# Patient Record
Sex: Female | Born: 1935 | Race: Black or African American | Hispanic: No | Smoking: Never smoker
Health system: Southern US, Community
[De-identification: ages and names within clinical notes are randomized; demographics above are authoritative.]

## PROBLEM LIST (undated history)

## (undated) DIAGNOSIS — N189 Chronic kidney disease, unspecified: Secondary | ICD-10-CM

## (undated) DIAGNOSIS — F0280 Dementia in other diseases classified elsewhere without behavioral disturbance: Secondary | ICD-10-CM

## (undated) DIAGNOSIS — E785 Hyperlipidemia, unspecified: Secondary | ICD-10-CM

## (undated) DIAGNOSIS — I10 Essential (primary) hypertension: Secondary | ICD-10-CM

## (undated) DIAGNOSIS — F0390 Unspecified dementia without behavioral disturbance: Secondary | ICD-10-CM

## (undated) HISTORY — DX: Dementia in other diseases classified elsewhere, unspecified severity, without behavioral disturbance, psychotic disturbance, mood disturbance, and anxiety: F02.80

## (undated) HISTORY — DX: Chronic kidney disease, unspecified: N18.9

## (undated) HISTORY — DX: Unspecified dementia, unspecified severity, without behavioral disturbance, psychotic disturbance, mood disturbance, and anxiety: F03.90

## (undated) HISTORY — PX: ABDOMINAL HYSTERECTOMY: SHX81

## (undated) HISTORY — DX: Hyperlipidemia, unspecified: E78.5

## (undated) HISTORY — DX: Essential (primary) hypertension: I10

## (undated) HISTORY — PX: BACK SURGERY: SHX140

---

## 2002-06-11 ENCOUNTER — Emergency Department (HOSPITAL_COMMUNITY): Admission: EM | Admit: 2002-06-11 | Discharge: 2002-06-11 | Payer: Self-pay | Admitting: Emergency Medicine

## 2002-06-11 ENCOUNTER — Encounter: Payer: Self-pay | Admitting: Emergency Medicine

## 2004-09-15 ENCOUNTER — Ambulatory Visit (HOSPITAL_COMMUNITY): Admission: RE | Admit: 2004-09-15 | Discharge: 2004-09-15 | Payer: Self-pay | Admitting: Pulmonary Disease

## 2004-09-21 ENCOUNTER — Ambulatory Visit (HOSPITAL_COMMUNITY): Admission: RE | Admit: 2004-09-21 | Discharge: 2004-09-21 | Payer: Self-pay | Admitting: Pulmonary Disease

## 2004-10-01 ENCOUNTER — Ambulatory Visit (HOSPITAL_COMMUNITY): Admission: RE | Admit: 2004-10-01 | Discharge: 2004-10-01 | Payer: Self-pay | Admitting: Pulmonary Disease

## 2004-10-25 ENCOUNTER — Inpatient Hospital Stay (HOSPITAL_COMMUNITY): Admission: RE | Admit: 2004-10-25 | Discharge: 2004-10-26 | Payer: Self-pay | Admitting: Neurosurgery

## 2004-10-25 ENCOUNTER — Encounter (INDEPENDENT_AMBULATORY_CARE_PROVIDER_SITE_OTHER): Payer: Self-pay | Admitting: *Deleted

## 2007-08-13 ENCOUNTER — Ambulatory Visit (HOSPITAL_COMMUNITY): Admission: RE | Admit: 2007-08-13 | Discharge: 2007-08-13 | Payer: Self-pay | Admitting: Neurosurgery

## 2007-09-05 ENCOUNTER — Encounter (INDEPENDENT_AMBULATORY_CARE_PROVIDER_SITE_OTHER): Payer: Self-pay | Admitting: Neurosurgery

## 2007-09-05 ENCOUNTER — Inpatient Hospital Stay (HOSPITAL_COMMUNITY): Admission: RE | Admit: 2007-09-05 | Discharge: 2007-09-09 | Payer: Self-pay | Admitting: Neurosurgery

## 2009-05-11 ENCOUNTER — Emergency Department (HOSPITAL_COMMUNITY): Admission: EM | Admit: 2009-05-11 | Discharge: 2009-05-11 | Payer: Self-pay | Admitting: Emergency Medicine

## 2010-08-06 ENCOUNTER — Emergency Department (HOSPITAL_COMMUNITY)
Admission: EM | Admit: 2010-08-06 | Discharge: 2010-08-06 | Payer: Self-pay | Source: Home / Self Care | Admitting: Emergency Medicine

## 2010-09-21 ENCOUNTER — Ambulatory Visit (HOSPITAL_COMMUNITY)
Admission: RE | Admit: 2010-09-21 | Discharge: 2010-09-21 | Payer: Self-pay | Source: Home / Self Care | Attending: Pulmonary Disease | Admitting: Pulmonary Disease

## 2010-10-22 ENCOUNTER — Ambulatory Visit (HOSPITAL_COMMUNITY)
Admission: RE | Admit: 2010-10-22 | Discharge: 2010-10-22 | Payer: Self-pay | Source: Home / Self Care | Admitting: "Endocrinology

## 2010-12-01 ENCOUNTER — Emergency Department (HOSPITAL_COMMUNITY): Payer: Medicare Other

## 2010-12-01 ENCOUNTER — Inpatient Hospital Stay (HOSPITAL_COMMUNITY)
Admission: EM | Admit: 2010-12-01 | Discharge: 2010-12-02 | DRG: 312 | Disposition: A | Discharge status: Home / Self Care | Payer: Medicare Other | Attending: Pulmonary Disease | Admitting: Pulmonary Disease

## 2010-12-01 DIAGNOSIS — D649 Anemia, unspecified: Secondary | ICD-10-CM | POA: Diagnosis present

## 2010-12-01 DIAGNOSIS — E119 Type 2 diabetes mellitus without complications: Secondary | ICD-10-CM | POA: Diagnosis present

## 2010-12-01 DIAGNOSIS — R42 Dizziness and giddiness: Secondary | ICD-10-CM

## 2010-12-01 DIAGNOSIS — R000 Tachycardia, unspecified: Secondary | ICD-10-CM | POA: Diagnosis present

## 2010-12-01 DIAGNOSIS — E0590 Thyrotoxicosis, unspecified without thyrotoxic crisis or storm: Secondary | ICD-10-CM | POA: Diagnosis present

## 2010-12-01 DIAGNOSIS — E785 Hyperlipidemia, unspecified: Secondary | ICD-10-CM | POA: Diagnosis present

## 2010-12-01 DIAGNOSIS — R55 Syncope and collapse: Principal | ICD-10-CM | POA: Diagnosis present

## 2010-12-01 DIAGNOSIS — I517 Cardiomegaly: Secondary | ICD-10-CM

## 2010-12-01 DIAGNOSIS — I10 Essential (primary) hypertension: Secondary | ICD-10-CM | POA: Diagnosis present

## 2010-12-01 LAB — GLUCOSE, CAPILLARY
Glucose-Capillary: 127 mg/dL — ABNORMAL HIGH (ref 70–99)
Glucose-Capillary: 189 mg/dL — ABNORMAL HIGH (ref 70–99)
Glucose-Capillary: 134 mg/dL — ABNORMAL HIGH (ref 70–99)

## 2010-12-01 LAB — CBC
HCT: 31.1 % — ABNORMAL LOW (ref 36.0–46.0)
Hemoglobin: 10.6 g/dL — ABNORMAL LOW (ref 12.0–15.0)
MCH: 27.4 pg (ref 26.0–34.0)
MCHC: 34.1 g/dL (ref 30.0–36.0)
MCV: 80.4 fL (ref 78.0–100.0)
RBC: 3.87 MIL/uL (ref 3.87–5.11)
RDW: 13.6 % (ref 11.5–15.5)

## 2010-12-01 LAB — POCT CARDIAC MARKERS
CKMB, poc: 1.2 ng/mL (ref 1.0–8.0)
Myoglobin, poc: 102 ng/mL (ref 12–200)
Troponin i, poc: <0.05 ng/mL (ref 0.00–0.09)

## 2010-12-01 LAB — BASIC METABOLIC PANEL
BUN: 22 mg/dL (ref 6–23)
CO2: 27 mEq/L (ref 19–32)
Calcium: 9.3 mg/dL (ref 8.4–10.5)
Chloride: 99 mEq/L (ref 96–112)
Creatinine, Ser: 1.21 mg/dL — ABNORMAL HIGH (ref 0.4–1.2)
GFR calc Af Amer: 52 mL/min — ABNORMAL LOW (ref >60)
GFR calc non Af Amer: 43 mL/min — ABNORMAL LOW (ref >60)
Glucose, Bld: 163 mg/dL — ABNORMAL HIGH (ref 70–99)
Potassium: 3.5 mEq/L (ref 3.5–5.1)

## 2010-12-01 LAB — DIFFERENTIAL
Basophils Absolute: 0 10*3/uL (ref 0.0–0.1)
Basophils Relative: 0 % (ref 0–1)
Eosinophils Absolute: 0 10*3/uL (ref 0.0–0.7)
Lymphocytes Relative: 26 % (ref 12–46)
Neutro Abs: 4.2 10*3/uL (ref 1.7–7.7)
Neutrophils Relative %: 65 % (ref 43–77)

## 2010-12-01 LAB — URINALYSIS, ROUTINE W REFLEX MICROSCOPIC
Bilirubin Urine: NEGATIVE
Ketones, ur: 15 mg/dL — AB
Nitrite: NEGATIVE
Protein, ur: NEGATIVE mg/dL
Urine Glucose, Fasting: NEGATIVE mg/dL
pH: 6 (ref 5.0–8.0)

## 2010-12-01 LAB — CARDIAC PANEL(CRET KIN+CKTOT+MB+TROPI)
CK, MB: 1.9 ng/mL (ref 0.3–4.0)
CK, MB: 1.8 ng/mL (ref 0.3–4.0)
Relative Index: INVALID (ref 0.0–2.5)
Relative Index: INVALID (ref 0.0–2.5)
Total CK: 86 U/L (ref 7–177)
Total CK: 97 U/L (ref 7–177)
Troponin I: 0.02 ng/mL (ref 0.00–0.06)

## 2010-12-02 LAB — DIFFERENTIAL
Basophils Absolute: 0 10*3/uL (ref 0.0–0.1)
Basophils Relative: 0 % (ref 0–1)
Eosinophils Absolute: 0 10*3/uL (ref 0.0–0.7)
Eosinophils Relative: 0 % (ref 0–5)
Monocytes Absolute: 0.8 10*3/uL (ref 0.1–1.0)

## 2010-12-02 LAB — BASIC METABOLIC PANEL
Calcium: 9.1 mg/dL (ref 8.4–10.5)
GFR calc Af Amer: 56 mL/min — ABNORMAL LOW (ref >60)
GFR calc non Af Amer: 46 mL/min — ABNORMAL LOW (ref >60)
Glucose, Bld: 122 mg/dL — ABNORMAL HIGH (ref 70–99)
Sodium: 141 mEq/L (ref 135–145)

## 2010-12-02 LAB — CBC
HCT: 30.9 % — ABNORMAL LOW (ref 36.0–46.0)
Hemoglobin: 10.2 g/dL — ABNORMAL LOW (ref 12.0–15.0)
MCH: 26.8 pg (ref 26.0–34.0)
MCHC: 33 g/dL (ref 30.0–36.0)
MCV: 81.3 fL (ref 78.0–100.0)
Platelets: 222 10*3/uL (ref 150–400)
RBC: 3.8 MIL/uL — ABNORMAL LOW (ref 3.87–5.11)
RDW: 13.8 % (ref 11.5–15.5)
WBC: 6.2 10*3/uL (ref 4.0–10.5)

## 2010-12-02 LAB — GLUCOSE, CAPILLARY

## 2010-12-02 LAB — IRON AND TIBC: TIBC: 265 ug/dL (ref 250–470)

## 2010-12-14 LAB — POCT CARDIAC MARKERS
CKMB, poc: <1 ng/mL — ABNORMAL LOW (ref 1.0–8.0)
Myoglobin, poc: 58.2 ng/mL (ref 12–200)

## 2010-12-14 LAB — DIFFERENTIAL
Lymphocytes Relative: 21 % (ref 12–46)
Lymphs Abs: 1.4 10*3/uL (ref 0.7–4.0)
Monocytes Relative: 8 % (ref 3–12)
Neutro Abs: 4.6 10*3/uL (ref 1.7–7.7)
Neutrophils Relative %: 71 % (ref 43–77)

## 2010-12-14 LAB — CBC
Hemoglobin: 11.3 g/dL — ABNORMAL LOW (ref 12.0–15.0)
MCH: 27 pg (ref 26.0–34.0)
RBC: 4.2 MIL/uL (ref 3.87–5.11)

## 2010-12-14 LAB — BASIC METABOLIC PANEL
CO2: 27 mEq/L (ref 19–32)
Calcium: 9.8 mg/dL (ref 8.4–10.5)
GFR calc Af Amer: >60 mL/min (ref >60)
GFR calc non Af Amer: 52 mL/min — ABNORMAL LOW (ref >60)
Sodium: 140 mEq/L (ref 135–145)

## 2010-12-18 NOTE — Progress Notes (Signed)
  NAMEGIOVANNINA, SPADEA          ACCOUNT NO.:  [id number]  MEDICAL RECORD NO.:  [id number]           PATIENT TYPE:  I  LOCATION:  IC02                          FACILITY:  [facility]  PHYSICIAN:  Psalm Arman L. Luan Pulling, M.D.DATE OF BIRTH:  October 15, 1930  DATE OF PROCEDURE: DATE OF DISCHARGE:                                PROGRESS NOTE   Ms. Sumption was admitted yesterday with 2 episodes of syncope and tachycardia that was non documented.  Dr. Lattie Haw has seen her and suggested we do an outpatient workup once we have ruled out myocardial infarction which has been done.  Her physical exam this morning shows that she is awake and alert.  She looks very comfortable.  She has not had any more cardiac arrhythmias.  Her lab work shows that her electrolytes were normal.  Glucose is 122. CBC shows white count 6200, hemoglobin is 10.2, platelets 222 and her TSH was normal.  Cardiac panels have been normal.  Echocardiogram is essentially normal.  ASSESSMENT:  I think she is ready for discharge.  PLAN:  To discharge her home.  Please see discharge summary for details.     Lenford Beddow L. Luan Pulling, M.D.     ELH/MEDQ  D:  12/02/2010  T:  12/02/2010  Job:  [id number]  Electronically Signed by Sinda Du M.D. on 12/18/2010 10:37:53 AM

## 2010-12-18 NOTE — Discharge Summary (Signed)
  Betty [name], Betty [name]          ACCOUNT NO.:  [id number]  MEDICAL RECORD NO.:  [id number]           PATIENT TYPE:  I  LOCATION:  IC02                          FACILITY:  [facility]  PHYSICIAN:  Neriyah Cercone L. Luan Pulling, M.D.DATE OF BIRTH:  [birth year]  DATE OF ADMISSION:  12/01/2010 DATE OF DISCHARGE:  LH                              DISCHARGE SUMMARY   FINAL DISCHARGE DIAGNOSES: 1. Syncopal episodes x2. 2. Tachycardia not documented. 3. Hypertension. 4. Diabetes. 5. Anemia. 6. Hyperlipidemia. 7. Hyperthyroidism. 8. Status post lumbar laminectomy. 9. Status post hysterectomy.  [name] is a 77 year old, who came to the emergency room with syncope x2.  These actually occurred while she was in bed.  She told me that she totally lost consciousness, but she told the cardiologist that she did not.  Her exam, she eventually came to the emergency room because of this. She has had a recent thyroid biopsy for hyperthyroidism.  Exam showed that she was awake and alert.  Pupils are reactive.  Nose and throat were clear.  Mucous membranes are moist.  Chest was clear.  Heart was regular without tachycardia.  Her abdomen was soft.  Her blood pressure initially was elevated at about 200/70, later about 160/70.  EKG showed sinus rhythm rate in the 80s, nonspecific ST-T wave changes.  BUN was 22, creatinine 1.21.  HOSPITAL COURSE:  She was brought in for rule out myocardial infarction. She had consultation with [facility], who felt that since she was back to baseline that she should have an event monitor.  She had echocardiogram that was essentially normal.  She ruled out for myocardial infarction.  TSH was back to normal in the hospital, so she is going to be discharged home in improved condition on.  She will be discharged home to continue her Hyzaar 100/12.5.  She will be on amlodipine 5 mg daily.  She actually is supposed to be on that at home, but did not bring, it is part of her  medications, metoprolol 25 mg b.i.d.  She will continue with her Fosamax 70 mg weekly and continue with her metformin 1000 mg b.i.d.  She will have an event monitor.  She will be discharged and as mentioned in much improved condition.  She will continue with diabetic diet.     Olyvia Gopal L. Luan Pulling, M.D.     ELH/MEDQ  D:  12/02/2010  T:  12/02/2010  Job:  [id number]  Electronically Signed by Sinda Du M.D. on 12/18/2010 10:37:35 AM

## 2010-12-18 NOTE — H&P (Signed)
Betty [name], [name]          ACCOUNT NO.:  [id number]  MEDICAL RECORD NO.:  [id number]           PATIENT TYPE:  I  LOCATION:  IC02                          FACILITY:  [facility]  PHYSICIAN:  Ansel Ferrall L. Luan Pulling, M.D.DATE OF BIRTH:  1930-12-19  DATE OF ADMISSION:  12/01/2010 DATE OF DISCHARGE:  LH                             HISTORY & PHYSICAL   Betty [name] was admitted from the emergency room with syncope.  This is an unusual history.  She said that she had two episodes of syncope this morning, actually while she was in bed.  She totally lost consciousness. She woke up, did not have any evidence of a postictal state or anything else.  She woke up and felt like she was normal, but she felt like her heart was going too fast.  She tried she had been fainted again.  The second time she woke up, her heart was no longer racing and she was able to get up.  She was brought to the emergency room.  She has a history of problems with her thyroid, has recently been evaluated by [name], the endocrinologist, and had a thyroid biopsy.  Her past medical history is positive for hypertension, diabetes, anemia, hyperlipidemia, hyperthyroidism.  Surgically, she has had back surgery and hysterectomy.  SOCIAL HISTORY:  She does not have any history of smoking.  She does not use any alcohol.  Does not use any illicit drugs.  She has no known drug allergies.  At home, she has been taking: 1. Metformin 500 mg b.i.d. 2. Cozaar 100/25 daily. 3. She is also supposed to be on amlodipine, but apparently is not. 4. Crestor 20 mg daily.  REVIEW OF SYSTEMS:  Except as mentioned is negative.  Her family history is not known to be positive for hyperthyroidism.  PHYSICAL EXAMINATION:  GENERAL:  Her exam shows that she is awake and alert. HEENT:  Her pupils are reactive.  Nose and throat are clear.  Mucous membranes are moist. NECK:  Supple.  She does not have any bruits or JVD. CHEST:  Clear without  wheezes, rales, or rhonchi. HEART:  Regular now with no tachycardia with normal sinus rhythm.  She does not have a murmur, gallop, or rub. ABDOMEN:  Soft without masses. EXTREMITIES:  Showed no edema. CENTRAL NERVOUS SYSTEM EXAMINATION:  Grossly intact. VITAL SIGNS:  Her blood pressure initially was about 200/70, later went down to about 160/70, pulse in the 80s and regular.  She was afebrile.  Her EKG showed a sinus rhythm with a rate in the 80s and nonspecific ST- T wave changes.  Her white blood count was 6500, hemoglobin 10.6, platelets 228.  Her cardiac panel in the emergency room was normal, potassium 3.5, BUN of 22, creatinine 1.21.  Chest x-ray, no acute cardiopulmonary process.  Her urine was negative.  Assessment then she has had syncope.  It is not clear what happened. This may have been a tachyarrhythmia related to her hyperthyroidism. She is going to be admitted, ruled out for myocardial infarction, I am going to ask for Cardiology consultation.  She will have a monitor because of concerns that this might  be related to cardiac arrhythmias.     Sagan Wurzel L. Luan Pulling, M.D.     ELH/MEDQ  D:  12/01/2010  T:  12/02/2010  Job:  [id number]  Electronically Signed by Sinda Du M.D. on 12/18/2010 10:37:39 AM

## 2010-12-29 DIAGNOSIS — R55 Syncope and collapse: Secondary | ICD-10-CM

## 2011-01-08 LAB — URINALYSIS, ROUTINE W REFLEX MICROSCOPIC
Protein, ur: NEGATIVE mg/dL
Urobilinogen, UA: 0.2 mg/dL (ref 0.0–1.0)

## 2011-01-08 LAB — CBC
MCHC: 34 g/dL (ref 30.0–36.0)
Platelets: 175 10*3/uL (ref 150–400)
RBC: 4.1 MIL/uL (ref 3.87–5.11)
WBC: 5.4 10*3/uL (ref 4.0–10.5)

## 2011-01-08 LAB — DIFFERENTIAL
Basophils Relative: 0 % (ref 0–1)
Monocytes Relative: 10 % (ref 3–12)
Neutro Abs: 3.4 10*3/uL (ref 1.7–7.7)
Neutrophils Relative %: 62 % (ref 43–77)

## 2011-01-08 LAB — BASIC METABOLIC PANEL
BUN: 20 mg/dL (ref 6–23)
CO2: 30 mEq/L (ref 19–32)
Calcium: 8.9 mg/dL (ref 8.4–10.5)
Creatinine, Ser: 1.15 mg/dL (ref 0.4–1.2)
GFR calc Af Amer: 56 mL/min — ABNORMAL LOW (ref >60)

## 2011-01-08 LAB — GLUCOSE, CAPILLARY: Glucose-Capillary: 187 mg/dL — ABNORMAL HIGH (ref 70–99)

## 2011-02-05 NOTE — Consult Note (Signed)
NAMEKRYSTEL, Betty [name]          ACCOUNT NO.:  [id number]  MEDICAL RECORD NO.:  [id number]           PATIENT TYPE:  I  LOCATION:  IC02                          FACILITY:  [facility]  PHYSICIAN:  Cristopher Estimable. Lattie Haw, MD, FACCDATE OF BIRTH:  09-04-1931  DATE OF CONSULTATION:  12/01/2010 DATE OF DISCHARGE:                                CONSULTATION   CHIEF COMPLAINT:  Dizziness.  HISTORY OF PRESENT ILLNESS:  This is a 77 year old African American female patient who awakened with dizziness and rapid heart beat as well as skipping at 4 a.m.  She became presyncopal but did not fully lose consciousness.  It occurred again and lasted approximately 15 minutes. She got up and had her son take her blood pressure which was 200/66, and she came to the emergency room.  She recently was diagnosed with hyperthyroidism and had a biopsy earlier this month.  She is not treated for this yet.  She has occasional palpitations with fast skipping heartbeats that gets better when she walks around.  This occurs approximately once a week.  She has never had it as severe as this morning that awakened her from sleep.  She denies frank syncope, chest pain, dyspnea, dyspnea on exertion.  She walks approximately 1-1/2 miles a day without problems.  CURRENT MEDICATIONS: 1. Amlodipine 5 mg daily. 2. Hyzaar 100 mg/25 mg daily. 3. Lovenox. 4. Crestor 20 mg daily. 5. She was taking metformin 500 mg b.i.d. prior to admission.  PAST MEDICAL HISTORY:  Significant for hypertension, recently diagnosed hyperthyroidism, biopsy earlier this month, diabetes mellitus, history of DJD.  She has had lumbar L4 and L5 surgery in December 2008.  She has also had partial hysterectomy.  SOCIAL HISTORY:  She is widowed, lives with her son.  She has 4 children.  She walks 1-1/2 miles per day.  She has never smoked tobacco.  FAMILY HISTORY:  Her mother died of an MI at age 51.  Father died of prostate cancer.  She has 1 sister that  with breast cancer.  One sister died of an MI in her 40s.  Two brothers are alive and well.  One sister has thyroid problems.  REVIEW OF SYSTEMS:  Significant for those stated in HPI.  She also has gastroesophageal reflux symptoms and belching as well as sour taste in her mouth.  All other systems reviewed and negative.  PHYSICAL EXAMINATION:  GENERAL:  This is a very pleasant, young-looking 77 year old female, in no acute distress. VITAL SIGNS:  Orthostatic blood pressures were taken.  Blood pressure 200/68 lying, 210/73 sitting, 205/77 standing, pulse is 80, afebrile, respirations 16. HEENT:  Head is normocephalic without signs of trauma.  Extraocular movements are intact.  Pupils equal and reactive to light and accommodation.  Nasal mucosa is moist.  Throat is without erythema or exudate. NECK:  No thyroid enlargement.  She did have a left carotid bruits and hard palpable nodule on the right.  No JVD or HJR. LUNGS:  Clear anterior, posterior, and lateral. HEART:  Regular rate and rhythm at 80 beats per minute.  Normal S1 and S2.  No S3 or S4, 2/6 systolic murmur at the  left sternal border. SKIN:  No rashes or lesions. ABDOMEN:  Soft without organomegaly, masses, lesions, or abnormal tenderness. EXTREMITIES:  Without cyanosis, clubbing, or edema.  She has good distal pulses. NEUROLOGIC:  Without focal deficit.  EKG, normal sinus rhythm at 84 beats per minute with nonspecific ST-T wave changes.  Initial MB and troponin are negative.  Thyroid pending. Potassium is 3.5.  Hemoglobin is 10.6, hematocrit 31.  IMPRESSION: 1. Near syncope with palpitations, rule out arrhythmia. 2. Hyperthyroidism, newly diagnosed. 3. Hypertension, uncontrolled.  She has not received any medications     yet. 4. Diabetes mellitus. 5. Anemia with gastroesophageal reflux disease symptoms.  She needs     stool guaiac and possible GI consult.  PLAN:  Suspect her symptoms are secondary to arrhythmia which  could be due to her hyperthyroidism.  We will add Toprol for blood pressure and heart rate control.  We will wait for the cardiac enzymes, TSH, and echo.     Ermalinda Barrios, PA-C   ______________________________ Cristopher Estimable. Lattie Haw, MD, [facility]    ML/MEDQ  D:  12/01/2010  T:  12/01/2010  Job:  [id number]  Electronically Signed by Ermalinda Barrios PA-C on 12/29/2010 12:50:52 PM Electronically Signed by Jacqulyn Ducking MD [facility] on 02/05/2011 10:14:23 PM

## 2011-02-15 NOTE — Discharge Summary (Signed)
NAMESERIYA, HARKNESS          ACCOUNT NO.:  [id number]   MEDICAL RECORD NO.:  [id number]          PATIENT TYPE:  INP   LOCATION:  3040                         FACILITY:  [facility]   PHYSICIAN:  Marchia Meiers. Vertell Limber, M.D.  DATE OF BIRTH:  12/01/1930   DATE OF ADMISSION:  09/05/2007  DATE OF DISCHARGE:  09/08/2007                               DISCHARGE SUMMARY   NEUROSURGICAL DISCHARGE SUMMARY:   REASON FOR ADMISSION:  Lumbar spondylolisthesis at L4-5 with facet  arthropathy, synovial cyst and lumbar radiculopathy.   FINAL DIAGNOSIS:  Lumbar spondylolisthesis at L4-5 with facet  arthropathy, synovial cyst and lumbar radiculopathy.   HOSPITAL COURSE:  Betty [name] is a 77 year old woman who  previously underwent laminotomy for synovial cyst a couple of years ago.  She subsequently developed spondylolisthesis and worsening stenosis and  back pain and leg pain.  She underwent lumbar decompression and fusion  at the L4-5 level, and tolerated the procedure well, was gradually  mobilized and was doing well.  She had postoperative fever, which  resolved, and was doing well as of the sixth, and after having had bowel  movement, was discharged home.   DISCHARGE MEDICATIONS:  Percocet, Flexeril, along with her preoperative  medications of alendronate, Vytorin, Tarka and DiaBeta.   FOLLOWUP:  To follow up with Dr. Arnoldo Morale in three weeks.      Marchia Meiers. Vertell Limber, M.D.  Electronically Signed     JDS/MEDQ  D:  09/08/2007  T:  09/08/2007  Job:  [id number]

## 2011-02-15 NOTE — Op Note (Signed)
Betty [name], WRITER          ACCOUNT NO.:  [id number]   MEDICAL RECORD NO.:  [id number]          PATIENT TYPE:  INP   LOCATION:  3040                         FACILITY:  [facility]   PHYSICIAN:  Ophelia Charter, M.D.DATE OF BIRTH:  [birth year]   DATE OF PROCEDURE:  09/05/2007  DATE OF DISCHARGE:                               OPERATIVE REPORT   BRIEF HISTORY:  The patient is a 77-hour-old black female on whom I  performed a right L4 hemilaminectomy secondary to a right L4-L5 synovial  cyst a couple of years ago.  The patient did well but recently has  developed a radicular pain on the opposite side of her surgery, i.e. on  the left side.  She failed medical management, was worked up with a  lumbar MRI and lumbar x-rays which demonstrated the patient had  developed a synovial cyst at L4-L5 on the left, as well as spinal  stenosis and disc degeneration, and the plain x-rays demonstrated a  mobile spondylolisthesis at L4-L5.  I discussed the various treatment  options with the patient and her daughter including surgery.  They have  weighed the risks, benefits, and alternatives of surgery and have  decided to proceed with a lumbar decompression and fusion.   PREOPERATIVE DIAGNOSES:  L4-L5 grade 1 acquired spondylolisthesis,  degenerative disc disease, facet arthropathy, synovial cyst, lumbar  radiculopathy, and lumbago.   POSTOPERATIVE DIAGNOSES:  L4-L5 grade 1 acquired spondylolisthesis,  degenerative disc disease, facet arthropathy, synovial cyst, lumbar  radiculopathy, and lumbago.   PROCEDURE:  Left L4 laminotomy and foraminotomy to decompress the left  L4 and L5 nerve roots and remove the synovial cyst; left L4-L5  transforaminal lumbar interbody fusion with local autograft bone and  VITOSS bone-graft extender; insertion of left L4-L5 interbody prosthesis  (Capstone PEEK interbody prosthesis); L4-L5 posterior nonsegmental  instrumentation with Legacy titanium pedicle screws and  rods and L4-L5  posterolateral arthrodesis with local morselized autograft bone and  VITOSS bone graft extender.   SURGEON:  Ophelia Charter, MD   ASSISTANT:  Ashok Pall, MD   ANESTHESIA:  General endotracheal.   ESTIMATED BLOOD LOSS:  200 mL.   SPECIMENS:  None.   DRAINS:  None.   COMPLICATIONS:  None.   DESCRIPTION OF PROCEDURE:  The patient was brought to the operating room  by the anesthesia team.  General endotracheal anesthesia was induced.  The patient was turned to the prone position on Wilson frame.  Her  lumbosacral region was then prepared with Betadine scrub and Betadine  solution.  Sterile drapes were applied.  I then injected the area to be  incised with Marcaine with epinephrine solution.  I used a scalpel to  make a linear midline incision extending the prior lumbar incision in  the cephalad and caudal direction.  I then used electrocautery to  perform a bilateral subperiosteal dissection exposing the spinous  process and lamina of L3, L4, L5, and upper sacrum.  We obtained  intraoperative radiograph to confirm our location.   We then inserted the Versa-Trac retractor for exposure.  We then used a  high-speed drill to perform a left L4  laminotomy.  I widened the  laminotomy with the Kerrison punch and removed the left L4-L5 ligament  flavum.  We encountered the expected synovial cyst.  It appeared to be a  typical synovial cyst, but we sent off some specimens to the pathologist  as well.  We then performed a foraminotomy about the left L4 and L5  nerve root completing the decompression.  Of note, the decompression at  this level was greater than required to do the interbody fusion  secondary to the stenosis and the synovial cyst.   Having completed the decompression, we now turned our attention to the  interbody fusion.  At this point, we had a wide lateral exposure of the  left L4-L5 intervertebral disc.  We incised the disc with a #15 blade  scalpel  and performed a partial intervertebral discectomy with the  pituitary forceps.  We prepared the vertebral endplates using curettes  clearing the soft tissue from the vertebral endplates in preparation for  the fusion.  We then used the trial spacers and determined to use a 12 x  22 mm Capstone PEEK interbody prosthesis.  We prefilled this prosthesis  with local autograft bone and VITOSS and then filled the anterior and  lateral end of disc spaces as well with VITOSS and local autograft bone.  We then inserted the prosthesis into the interspace from the left, of  course after retracting the neural structures out of harm's way.  We  then used a tamp to turn the prosthesis laterally.  There was a good  snug fit of the prosthesis in the interspace.  We then filled the  posterior end of disc space with VITOSS and local autograft bone  completing the transforaminal lumbar interbody fusion.   We now turned attention to the instrumentation.  Under fluoroscopic  guidance, we cannulated the bilateral L4 and L5 pedicles with bone  probes.  We tapped the pedicles with a 5.5 mm tap, probed inside the  tapped pedicles to rule out cortical breeches and then inserted a 6.5 x  15 mm pedicle screws bilaterally at L4 and 6.5 x 45 bilaterally at L5  under fluoroscopic guidance.  We then connected the unilateral pedicle  screws with a lordotic rod.  We compressed the construct and secured the  rod in place using caps completing the instrumentation.   We then turned our attention to posterolateral arthrodesis.  We used  high-speed drill to decorticate the remainder of the L4-L5 facet and  pars region and transverse processes and then laid a combination of  VITOSS bone-graft extender and local morselized autograft bone over  these decorticated posterolateral structures, and this completed the  posterolateral arthrodesis.   We then inspected the left thecal sac and the left L4 and L5 nerve roots  and noted  that they were well decompressed.  We obtained hemostasis  using bipolar electrocautery.  We removed the retractor and then  reapproximated the patient's thoracolumbar fascia with interrupted #1  Vicryl suture, subcutaneous tissue with interrupted 2-0 Vicryl suture,  and the skin with Steri-Strips and Benzoin.  The wound was then coated  with bacitracin ointment.  A sterile dressing was applied.  The drapes  were removed.  The patient was subsequently returned to the supine  position where she was extubated by the anesthesia team and transported  to the post-anesthesia care unit in a stable condition.  All sponge,  instrument, and needle counts were correct at the end of this case.  Ophelia Charter, M.D.  Electronically Signed     JDJ/MEDQ  D:  09/05/2007  T:  09/06/2007  Job:  [id number]

## 2011-02-16 ENCOUNTER — Encounter: Payer: Self-pay | Admitting: Cardiology

## 2011-02-18 NOTE — Op Note (Signed)
NAMEHEATHERLY, WEES          ACCOUNT NO.:  [id number]   MEDICAL RECORD NO.:  [id number]          PATIENT TYPE:  INP   LOCATION:  3039                         FACILITY:  [facility]   PHYSICIAN:  Ophelia Charter, M.D.DATE OF BIRTH:  [birth year]   DATE OF PROCEDURE:  10/25/2004  DATE OF DISCHARGE:                                 OPERATIVE REPORT   BRIEF HISTORY:  The patient is a 77 year old black female who has suffered  from back and right leg pain.  She has failed medical management and was  worked up with a lumbar MRI which demonstrated a large synovial cyst at L4-5  on the right.  I discussed the various treatment options with the patient,  including surgery.  She has weighed the risks, benefits and alternatives to  surgery and has decided to proceed with an L4 laminectomy for removal of  synovial cyst.   PREOPERATIVE DIAGNOSIS:  Right L4-5 synovial cyst.   POSTOPERATIVE DIAGNOSIS:  Right L4-5 synovial cyst.   PROCEDURES:  1.  Right L4 hemilaminectomy.  2.  Right L3 laminotomy for removal of intraspinal synovial cyst using      microdissection.   SURGEON:  Ophelia Charter, M.D.   ASSISTANT:  Elizabeth Sauer, M.D.   ANESTHESIA:  General endotracheal.   ESTIMATED BLOOD LOSS:  50 mL.   SPECIMENS:  Cyst.   DRAINS:  None.   COMPLICATIONS:  None.   DESCRIPTION OF PROCEDURE:  The patient was brought to the operating room by  the anesthesia team.  General endotracheal anesthesia was induced.  The  patient was turned in the prone position on the [place] frame.  The  lumbosacral region was then prepared with Betadine scrub and Betadine  solution.  Sterile drapes were applied.  I then injected the area to be  incised with Marcaine with epinephrine solution and used a scalpel to make a  linear midline incision over the L4-5 interspace using electrocautery.  I  performed a subperiosteal dissection, exposing the right spinous process and  lamina of L4, L3 and L5.  We then  obtained an intraoperative radiograph to  confirm our location.  We then inserted the McCollough retractor for  exposure and then brought in the operating microscope into the field.  Under  magnification and illumination, we completed the  microdissection/decompression.   We used a high-speed drill to perform a right L4 laminotomy.  We then used a  Kerrison punch to remove the L4-5 ligamentum flavum that remained over the  L4 lamina and the L3-4 ligamentum flavum.  We immediately noticed the  underlying synovial cyst.  The cyst was large and big going in a cephalad  direction enough that we thought it best to perform a right L3 laminotomy to  get above the synovial cyst.  We used a high-speed drill to perform a right  L3 laminotomy and went over the laminotomy with the Kerrison punch.  This  allowed [place] to get normal-looking dura above the synovial cyst.  We then used  microdissection to free up the synovial cyst from the dura.  It appeared to  be compressing both the  exiting L4, as well as the L5 nerve root.  We  removed it in multiple fragments using pituitary forceps and Kerrison  punches.  We then inspected the intervertebral disk.  There was a  spondylolisthesis at L4-5, but there was no significant herniated disk.  We  then palpated along the ventral surface of the thecal sac and along the exit  route of the bilateral L4-5 nerve roots.  They were well decompressed  throughout the neural foramen.  We then used bipolar cautery to provide  stringent hemostasis.  We then copiously irrigated the wound out with  bacitracin solution and removed the solution.  We then removed the  McCollough retractor.  We then reapproximated the patient's thoracolumbar  fascia with interrupted 1-0 Vicryl sutures, the subcutaneous tissues with  interrupted 2-0 Vicryl sutures and the skin with Steri-Strips and Benzoin.  The wound was then covered with Bacitracin ointment and a sterile dressing  was applied.   The drapes were removed.  The patient was returned to the  supine position where she was extubated by the anesthesia team and  transported to the postanesthesia care unit in stable condition.  All  sponge, needle and instrument counts were correct at the end of the case.      JDJ/MEDQ  D:  10/25/2004  T:  10/25/2004  Job:  [id number]

## 2011-07-11 LAB — CBC
HCT: 29.7 — ABNORMAL LOW
Hemoglobin: 9.8 — ABNORMAL LOW
Platelets: 257
RBC: 3.63 — ABNORMAL LOW
RDW: 14.9
WBC: 9.7

## 2011-07-11 LAB — BASIC METABOLIC PANEL
BUN: 16
CO2: 29
Calcium: 8.4
Calcium: 9.7
Creatinine, Ser: 1.07
GFR calc Af Amer: 56 — ABNORMAL LOW
GFR calc non Af Amer: 47 — ABNORMAL LOW
GFR calc non Af Amer: 50 — ABNORMAL LOW
Glucose, Bld: 89
Potassium: 3.4 — ABNORMAL LOW
Potassium: 4.3
Sodium: 140

## 2011-07-11 LAB — TYPE AND SCREEN
ABO/RH(D): A POS
Antibody Screen: NEGATIVE

## 2011-07-11 LAB — ABO/RH: ABO/RH(D): A POS

## 2011-07-12 LAB — CREATININE, SERUM
Creatinine, Ser: 1.04
GFR calc non Af Amer: 52 — ABNORMAL LOW

## 2012-05-17 ENCOUNTER — Ambulatory Visit (INDEPENDENT_AMBULATORY_CARE_PROVIDER_SITE_OTHER): Payer: Medicare Other | Admitting: Family Medicine

## 2012-05-17 ENCOUNTER — Encounter: Payer: Self-pay | Admitting: Family Medicine

## 2012-05-17 VITALS — BP 144/64 | HR 67 | Resp 16 | Ht 62.5 in | Wt 128.0 lb

## 2012-05-17 DIAGNOSIS — I10 Essential (primary) hypertension: Secondary | ICD-10-CM

## 2012-05-17 DIAGNOSIS — N3000 Acute cystitis without hematuria: Secondary | ICD-10-CM

## 2012-05-17 DIAGNOSIS — E119 Type 2 diabetes mellitus without complications: Secondary | ICD-10-CM

## 2012-05-17 DIAGNOSIS — R413 Other amnesia: Secondary | ICD-10-CM

## 2012-05-17 DIAGNOSIS — E785 Hyperlipidemia, unspecified: Secondary | ICD-10-CM

## 2012-05-17 DIAGNOSIS — E1129 Type 2 diabetes mellitus with other diabetic kidney complication: Secondary | ICD-10-CM | POA: Insufficient documentation

## 2012-05-17 NOTE — Assessment & Plan Note (Signed)
Will have her come in for MMSE +/- imaging, obtain recent labs At daughter request Urine culture to be obtained, advised UTI in acute setting can cause these changes but not chronic changes in memory   Family also advised, Mammogram and PAP not needed at her age, she can continue Mammogram at her discretion, she has opted out

## 2012-05-17 NOTE — Assessment & Plan Note (Addendum)
Fasting blood sugars are less than 120, will have her continue monitoring, continue Metformin  They were very concerned about her diet as she eats chips with lunches and eats instant oatmeal for breakfast daily

## 2012-05-17 NOTE — Assessment & Plan Note (Signed)
Continue crestor 

## 2012-05-17 NOTE — Assessment & Plan Note (Signed)
Per the family she has history of extremely high blood pressure which was very uncontrolled. Her systolics were previously no 200s. She has not taken her medications today therefore I cannot fully assess what her blood pressure is on her medications or she can come off of any. I will obtain her records she will continue her current regimen for now.

## 2012-05-17 NOTE — Patient Instructions (Addendum)
Continue current medications Take Blood pressure medications before next visit I will obtain records F/U 4 weeks for Memory- Give 30 minute slot

## 2012-05-17 NOTE — Progress Notes (Signed)
  Subjective:    Patient ID: Betty [name], female    DOB: 1930-12-09, 77 y.o.   MRN: [id number]  HPI Pt presents to establish care, Previous PCP- [name]  Endocrine- [name] She is here today with 2 of her daughters. They have many concerns and questions regarding her health. She however feels fine and has no concerns. May concern about multiple blood pressure medications, her preventative health screening and Y. she's not had a mammogram or Pap smear. He also concerned about her memory and they think it may be too chronic UTI though patient denies any symptoms of dysuria abdominal pain fever chills. He states that her mental status is change over the past year. She lives with one of her sons Betty [name]. Medications and history reviewed   Review of Systems  GEN- denies fatigue, fever, weight loss,weakness, recent illness HEENT- denies eye drainage, change in vision, nasal discharge, CVS- denies chest pain, palpitations RESP- denies SOB, cough, wheeze ABD- denies N/V, change in stools, abd pain GU- denies dysuria, hematuria, dribbling, incontinence MSK- + joint pain, muscle aches, injury Neuro- denies headache, dizziness, syncope, seizure activity      Objective:   Physical Exam GEN- NAD, alert and oriented x3 HEENT- PERRL, EOMI, non injected sclera, pink conjunctiva, MMM, oropharynx clear Neck- Supple, no thryomegaly CVS- RRR, no murmur RESP-CTAB ABD-NABS,soft,NT,ND EXT- No edema Pulses- Radial, DP- 2+ Psych-normal affect and Mood Skin- well healed lumbar scar        Assessment & Plan:

## 2012-05-19 LAB — URINE CULTURE: Organism ID, Bacteria: NO GROWTH

## 2012-05-21 ENCOUNTER — Ambulatory Visit (INDEPENDENT_AMBULATORY_CARE_PROVIDER_SITE_OTHER): Payer: Medicare Other | Admitting: Family Medicine

## 2012-05-21 ENCOUNTER — Encounter: Payer: Self-pay | Admitting: Family Medicine

## 2012-05-21 NOTE — Progress Notes (Signed)
Patient ID: Betty [name], female   DOB: [birth year], 78 y.o.   MRN: [id number] Pt thought she had received a phone call from my office stating she needed to come in ASAP, No one called, there was a misunderstanding Her urine culture was negative . Erroneous visit

## 2012-06-21 ENCOUNTER — Encounter: Payer: Self-pay | Admitting: Family Medicine

## 2012-06-21 ENCOUNTER — Ambulatory Visit (INDEPENDENT_AMBULATORY_CARE_PROVIDER_SITE_OTHER): Payer: Medicare Other | Admitting: Family Medicine

## 2012-06-21 VITALS — BP 150/76 | HR 72 | Resp 16 | Ht 62.5 in | Wt 128.4 lb

## 2012-06-21 DIAGNOSIS — F411 Generalized anxiety disorder: Secondary | ICD-10-CM

## 2012-06-21 DIAGNOSIS — I10 Essential (primary) hypertension: Secondary | ICD-10-CM

## 2012-06-21 DIAGNOSIS — F419 Anxiety disorder, unspecified: Secondary | ICD-10-CM | POA: Insufficient documentation

## 2012-06-21 DIAGNOSIS — R413 Other amnesia: Secondary | ICD-10-CM

## 2012-06-21 DIAGNOSIS — Z23 Encounter for immunization: Secondary | ICD-10-CM

## 2012-06-21 DIAGNOSIS — E785 Hyperlipidemia, unspecified: Secondary | ICD-10-CM

## 2012-06-21 DIAGNOSIS — E119 Type 2 diabetes mellitus without complications: Secondary | ICD-10-CM

## 2012-06-21 LAB — COMPREHENSIVE METABOLIC PANEL
ALT: 15 U/L (ref 0–35)
AST: 18 U/L (ref 0–37)
Alkaline Phosphatase: 48 U/L (ref 39–117)
Potassium: 4.1 mEq/L (ref 3.5–5.3)
Sodium: 141 mEq/L (ref 135–145)
Total Bilirubin: 0.5 mg/dL (ref 0.3–1.2)
Total Protein: 6.9 g/dL (ref 6.0–8.3)

## 2012-06-21 LAB — HEMOGLOBIN A1C: Mean Plasma Glucose: 154 mg/dL — ABNORMAL HIGH (ref <117)

## 2012-06-21 LAB — CBC
MCH: 26.9 pg (ref 26.0–34.0)
MCHC: 33.9 g/dL (ref 30.0–36.0)
MCV: 79.5 fL (ref 78.0–100.0)
Platelets: 243 10*3/uL (ref 150–400)
RDW: 14.3 % (ref 11.5–15.5)

## 2012-06-21 LAB — LIPID PANEL
HDL: 57 mg/dL (ref >39)
LDL Cholesterol: 72 mg/dL (ref 0–99)
Total CHOL/HDL Ratio: 2.6 Ratio
VLDL: 19 mg/dL (ref 0–40)

## 2012-06-21 NOTE — Patient Instructions (Addendum)
Get the blood test done today MRI of the brain to be done  Take the xanax 30 minutes before the procedure Continue all other medications Flu shot given    F/U 3 months

## 2012-06-21 NOTE — Assessment & Plan Note (Signed)
Chronic anxiety, uses xanax at bedtime 1/2 tablet

## 2012-06-21 NOTE — Assessment & Plan Note (Signed)
Last A1C 7.3%, due for labs, goal best control without negative symptoms

## 2012-06-21 NOTE — Progress Notes (Signed)
  Subjective:    Patient ID: Betty [name], female    DOB: 1931-07-15, 78 y.o.   MRN: [id number]  HPI  Patient here for  memory testing and blood pressure follow-up. She has no concerns about memory but family does. They state she typically repeats herself multiple times on the day. She has been lost trying to get to the grocery store on occasion. She sits at home by herself during the day and her son is there at night. 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 She is currently pain her bills in managing her money. She's no difficulty ambulating. No difficulties with ADL's. She is no history of depression but has been treated for anxiety in the past. She currently has Xanax which she uses very rarely.  Review of Systems  GEN- denies fatigue, fever, weight loss,weakness, recent illness HEENT- denies eye drainage, change in vision, nasal discharge, CVS- denies chest pain, palpitations RESP- denies SOB, cough, wheeze ABD- denies N/V, change in stools, abd pain GU- denies dysuria, hematuria, dribbling, incontinence MSK- denies joint pain, muscle aches, injury Neuro- denies headache, dizziness, syncope, seizure activity      Objective:   Physical Exam GEN- NAD, alert and oriented x3 HEENT- PERRL, EOMI, non injected sclera, pink conjunctiva, MMM, oropharynx clear Neck- Supple, no thyromegaly  CVS- RRR, no murmur RESP-CTAB ABD-NABS,soft,NT,ND EXT- No edema Pulses- Radial, DP- 2+ Psych-normal affect and Mood Neuro- CNII-XII in tact, no focal deficits  MMSE 25/ 30          Assessment & Plan:

## 2012-06-21 NOTE — Assessment & Plan Note (Signed)
MMSE exam concerning along with family concerns. Obtain labs, MRI brain to be done ,will then discuss treatment for early cognitive changes

## 2012-06-21 NOTE — Assessment & Plan Note (Signed)
BP improved on meds, still have above goal

## 2012-06-26 ENCOUNTER — Other Ambulatory Visit (HOSPITAL_COMMUNITY): Payer: Medicare Other

## 2012-06-27 ENCOUNTER — Ambulatory Visit (HOSPITAL_COMMUNITY)
Admission: RE | Admit: 2012-06-27 | Discharge: 2012-06-27 | Disposition: A | Discharge status: Home / Self Care | Payer: Medicare Other | Source: Ambulatory Visit | Attending: Family Medicine | Admitting: Family Medicine

## 2012-06-27 DIAGNOSIS — E119 Type 2 diabetes mellitus without complications: Secondary | ICD-10-CM | POA: Insufficient documentation

## 2012-06-27 DIAGNOSIS — I10 Essential (primary) hypertension: Secondary | ICD-10-CM

## 2012-06-27 DIAGNOSIS — G319 Degenerative disease of nervous system, unspecified: Secondary | ICD-10-CM | POA: Insufficient documentation

## 2012-06-27 DIAGNOSIS — R413 Other amnesia: Secondary | ICD-10-CM

## 2012-06-29 ENCOUNTER — Other Ambulatory Visit: Payer: Self-pay | Admitting: Family Medicine

## 2012-06-29 ENCOUNTER — Telehealth: Payer: Self-pay | Admitting: Family Medicine

## 2012-06-29 MED ORDER — DONEPEZIL HCL 5 MG PO TABS: 5.0000 mg | ORAL_TABLET | Freq: Every day | ORAL | Status: DC

## 2012-06-29 NOTE — Telephone Encounter (Signed)
Spoke with pt

## 2012-09-20 ENCOUNTER — Encounter: Payer: Self-pay | Admitting: Family Medicine

## 2012-09-20 ENCOUNTER — Ambulatory Visit (INDEPENDENT_AMBULATORY_CARE_PROVIDER_SITE_OTHER): Payer: Medicare Other | Admitting: Family Medicine

## 2012-09-20 VITALS — BP 178/70 | HR 80 | Resp 16 | Ht 62.5 in | Wt 124.0 lb

## 2012-09-20 DIAGNOSIS — F419 Anxiety disorder, unspecified: Secondary | ICD-10-CM

## 2012-09-20 DIAGNOSIS — F0390 Unspecified dementia without behavioral disturbance: Secondary | ICD-10-CM

## 2012-09-20 DIAGNOSIS — E119 Type 2 diabetes mellitus without complications: Secondary | ICD-10-CM

## 2012-09-20 DIAGNOSIS — E785 Hyperlipidemia, unspecified: Secondary | ICD-10-CM

## 2012-09-20 DIAGNOSIS — F03A0 Unspecified dementia, mild, without behavioral disturbance, psychotic disturbance, mood disturbance, and anxiety: Secondary | ICD-10-CM

## 2012-09-20 DIAGNOSIS — I10 Essential (primary) hypertension: Secondary | ICD-10-CM

## 2012-09-20 DIAGNOSIS — R634 Abnormal weight loss: Secondary | ICD-10-CM

## 2012-09-20 DIAGNOSIS — F411 Generalized anxiety disorder: Secondary | ICD-10-CM

## 2012-09-20 NOTE — Patient Instructions (Addendum)
Check your blood pressure 1 hour after taking the medications and record Continue diabetes medication  Give her boost or ensure Add B12 for energy , and tingling on feet  Labs before next visit  I recommend shingles vaccine  F/U 3 months

## 2012-09-20 NOTE — Progress Notes (Signed)
  Subjective:    Patient ID: Betty [name], female    DOB: [birth year], 78 y.o.   MRN: [id number]  HPI Patient here follow chronic medical problems. She's here with her son today. She states that her memory is improved and she is doing much better. Her appetite is also good. Her diabetes she denies any hypoglycemia. Her blood sugars are low 100s to 120 fasting. She states that her blood pressure has been good at home. She does need to get a new meter as a recently broke. She was very anxious and nervous coming up the elevated and felt her blood pressure going up her son also noted that she was very anxious and elevated she has a fear of them. She occasionally has some tingling in her right foot. She is asking if there is something she could use for energy.   Review of Systems  GEN- denies fatigue, fever, weight loss,weakness, recent illness HEENT- denies eye drainage, change in vision, nasal discharge, CVS- denies chest pain, palpitations RESP- denies SOB, cough, wheeze ABD- denies N/V, change in stools, abd pain GU- denies dysuria, hematuria, dribbling, incontinence MSK- denies joint pain, muscle aches, injury Neuro- denies headache, dizziness, syncope, seizure activity      Objective:   Physical Exam GEN- NAD, alert and oriented x3 HEENT- PERRL, EOMI, non injected sclera, pink conjunctiva, MMM, oropharynx clear Neck- Supple, no thryomegaly CVS- RRR, no murmur RESP-CTAB EXT- No edema Pulses- Radial, DP- 2+  MMSE 27/30       Assessment & Plan:

## 2012-09-23 DIAGNOSIS — R634 Abnormal weight loss: Secondary | ICD-10-CM | POA: Insufficient documentation

## 2012-09-23 DIAGNOSIS — F0390 Unspecified dementia without behavioral disturbance: Secondary | ICD-10-CM | POA: Insufficient documentation

## 2012-09-23 NOTE — Assessment & Plan Note (Signed)
4lb weight loss, appetite has been very good past few weeks, add ensure TID

## 2012-09-23 NOTE — Assessment & Plan Note (Signed)
Doing well no change to meds

## 2012-09-23 NOTE — Assessment & Plan Note (Signed)
Elevated some today, no change in meds, follow bp at home, may need to increase to 10mg 

## 2012-09-23 NOTE — Assessment & Plan Note (Addendum)
Fairly well controlled, labs before next visit, no change  Mild neuropathic signs, no meds needed, add B12 vit

## 2012-09-23 NOTE — Assessment & Plan Note (Signed)
Improve MMSE exam on aricept, family happy with progress

## 2012-10-04 ENCOUNTER — Ambulatory Visit: Payer: Medicare Other | Admitting: Family Medicine

## 2012-10-24 ENCOUNTER — Telehealth: Payer: Self-pay | Admitting: Family Medicine

## 2012-10-24 NOTE — Telephone Encounter (Signed)
Message copied by Alycia Rossetti on Wed Oct 24, 2012 11:10 PM ------      Message from: Eual Fines      Created: Tue Oct 23, 2012  2:32 PM      Regarding: RE: f/u BP       Her BP has been running 145/67, 126/56, 166/72, 141/65      ----- Message -----         From: Alycia Rossetti, MD         Sent: 10/23/2012  12:59 PM           To: Adair Laundry, LPN      Subject: FW: f/u BP                                                     Please call and see what her blood pressure are running?            ----- Message -----         From: Alycia Rossetti, MD         Sent: 09/23/2012  12:10 AM           To: Alycia Rossetti, MD      Subject: f/u BP

## 2012-10-24 NOTE — Telephone Encounter (Signed)
No change to medications at this time, continue to monitor at home, have her call if BP staying > 150/90

## 2012-10-31 NOTE — Telephone Encounter (Signed)
Patient aware.

## 2012-11-04 ENCOUNTER — Other Ambulatory Visit: Payer: Self-pay | Admitting: Family Medicine

## 2012-11-12 IMAGING — US US BIOPSY
1 series · 13 of 18 positions shown · non-contrast
Comparison: none

CLINICAL DATA: ULTRASOUND GUIDED FINE NEEDLE ASPIRATION BIOPSY OF THYROID NODULE:
ULTRASOUND-GUIDED FINE NEEDLE ASPIRATION BIOPSY OF ADDITIONAL
THYROID NODULE:
TECHNIQUE: Written informed consent for procedure obtained after discussion of
procedure and risks.
Time-out protocol performed.
Bilateral thyroid nodules localized by ultrasound.
Mass in the right thyroid lobe measured nearly 5 cm in greatest
size.
Nodule at inferior pole left thyroid lobe measured approximately
1.7 cm diameter.

[Series 1: us biopsy · 0.08mm/px · 18 acquisitions, 13 frames shown]
[im 1/18]
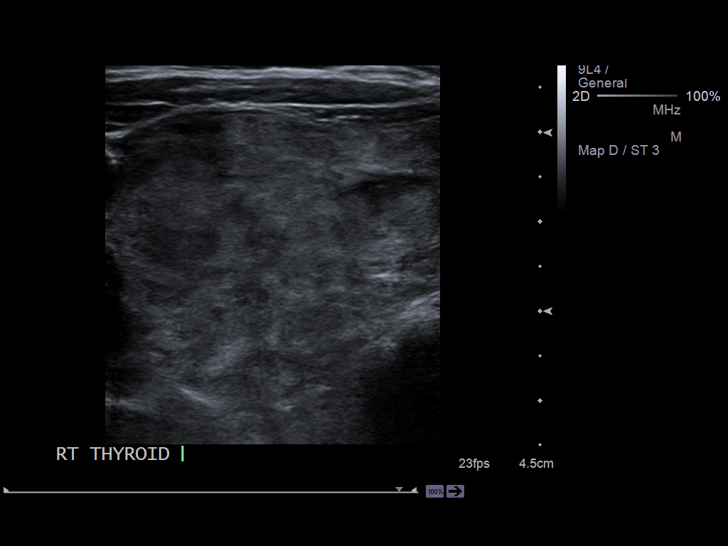
[im 3/18]
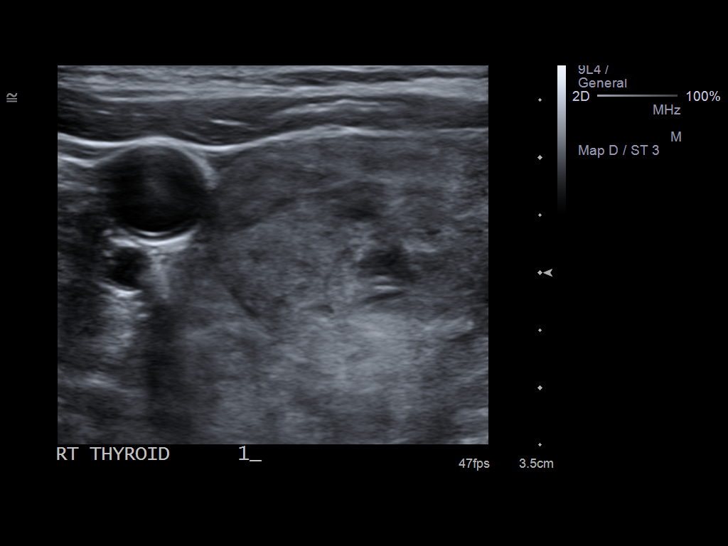
[im 4/18]
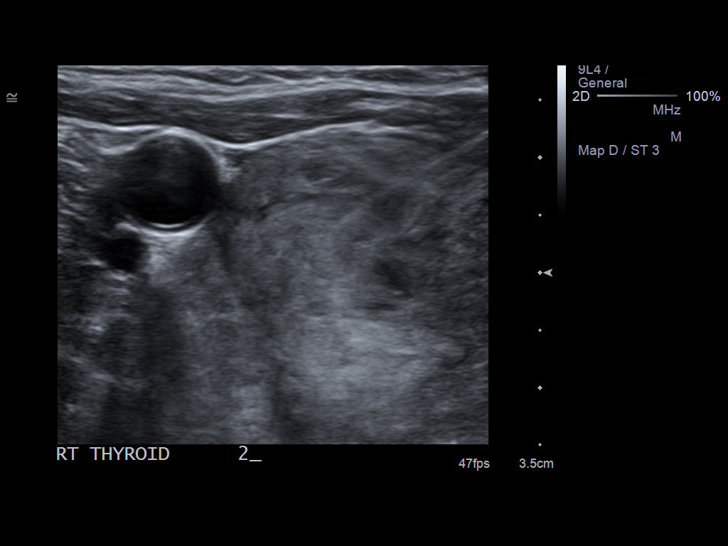
[im 5/18]
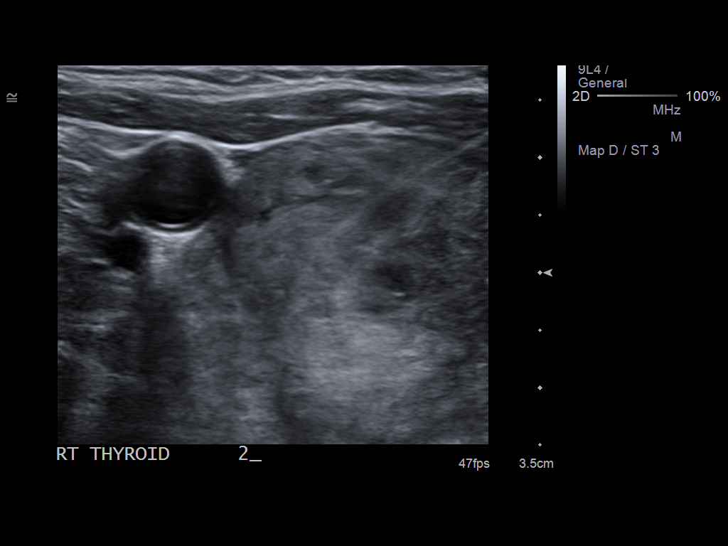
[im 7/18]
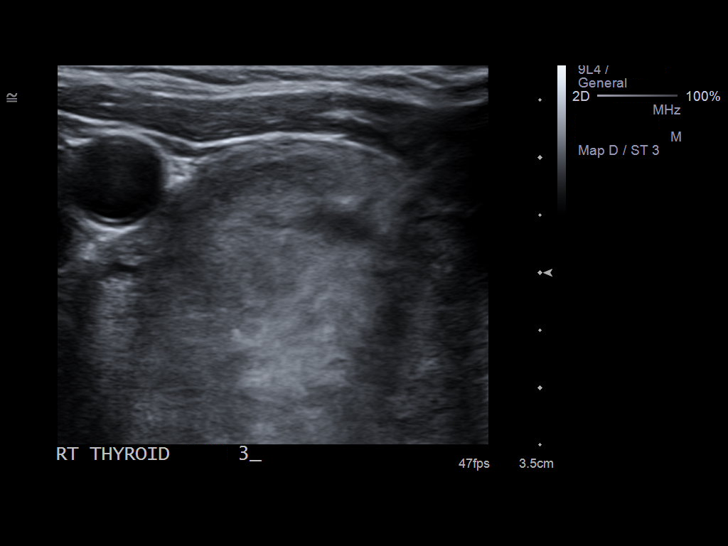
[im 8/18]
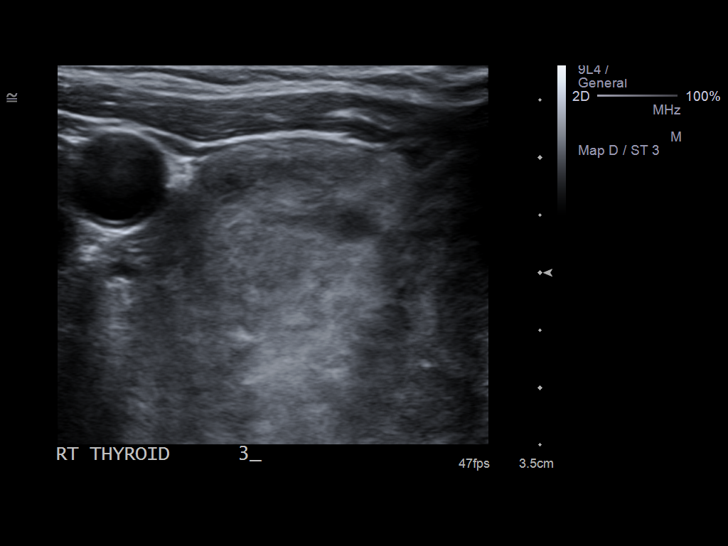
[im 10/18]
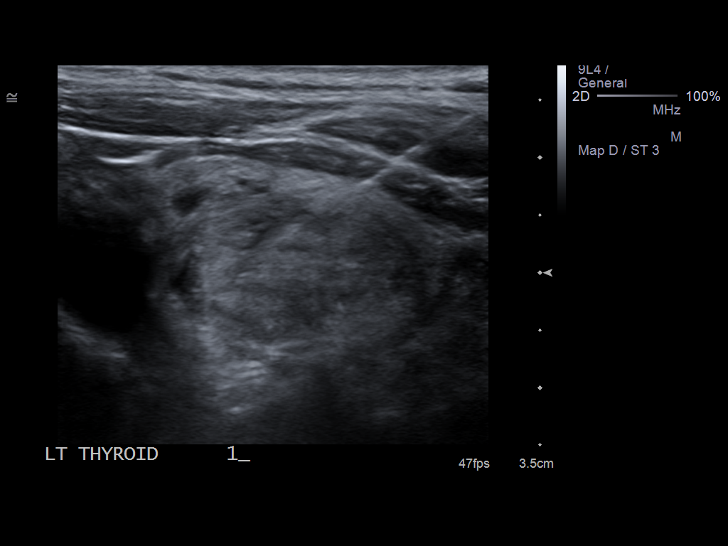
[im 11/18]
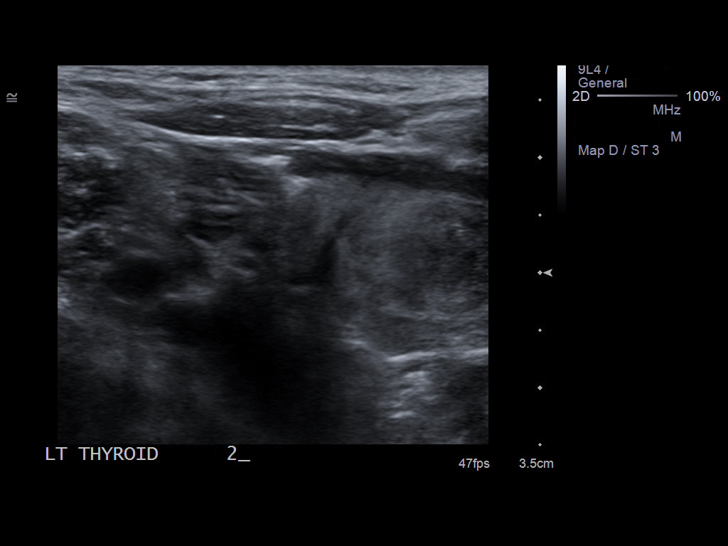
[im 12/18]
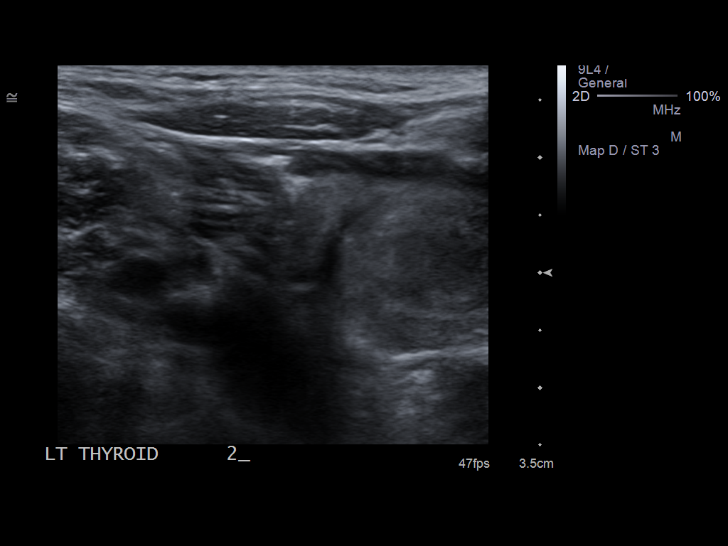
[im 14/18]
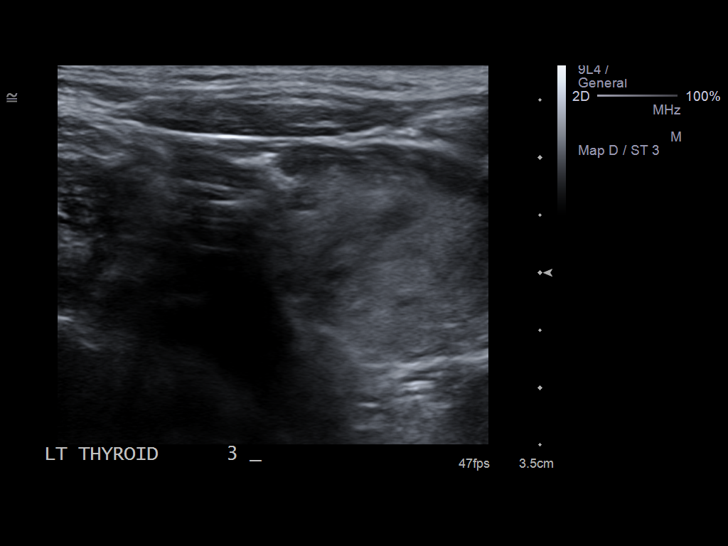
[im 15/18]
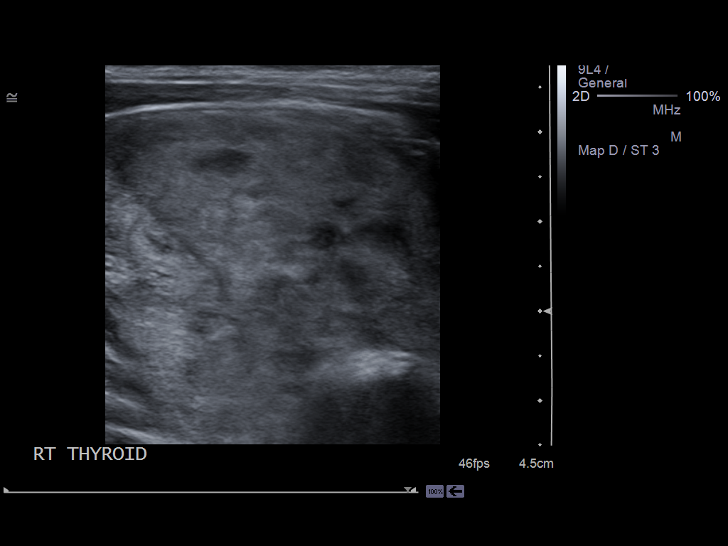
[im 16/18]
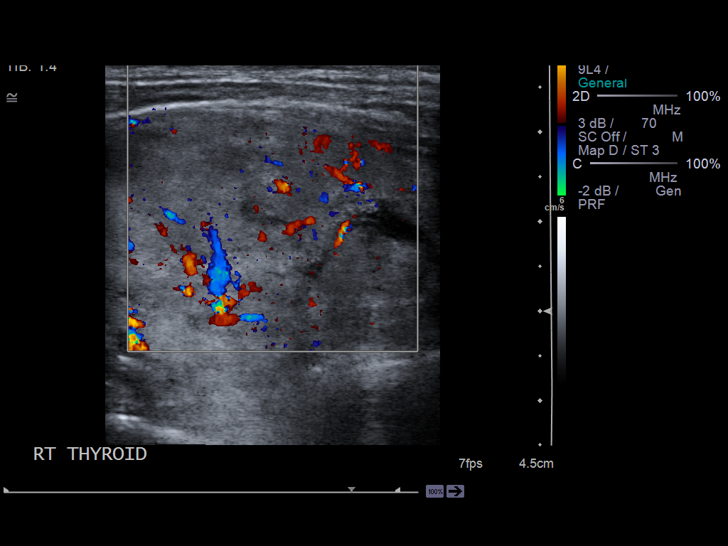
[im 18/18]
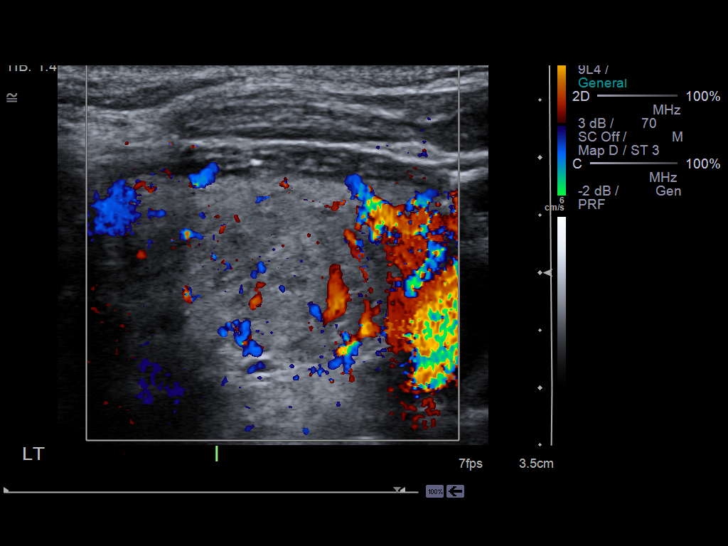

[13 of 18 positions shown; findings below may reference images not displayed]

Skin prepped and draped in usual sterile fashion.
Skin and soft tissues at two sites were anesthestized with 2 ml of
1% lidocaine.
Under sonographic guidance, three 25-gauge fine needle aspiration
biopsies of the dominant right thyroid mass were performed.
Procedure tolerated well by patient without immediate complication.

Subsequently, under sonographic guidance, three 25-gauge fine
needle aspiration biopsies of the left lower pole thyroid nodule
were performed.
Procedure  tolerated well by patient without immediate
complication.

No evidence of hematomas on postprocedural images.
Standard post procedure instructions were given to patient.
Specimens sent to laboratory for routine cytologic analysis.
IMPRESSION: Ultrasound guided fine needle aspiration biopsies of a dominant
right thyroid mass and of a left inferior pole thyroid nodule.

## 2012-12-19 LAB — BASIC METABOLIC PANEL
BUN: 28 mg/dL — ABNORMAL HIGH (ref 6–23)
Chloride: 105 mEq/L (ref 96–112)
Creat: 1.34 mg/dL — ABNORMAL HIGH (ref 0.50–1.10)

## 2012-12-19 LAB — CBC
MCH: 26.3 pg (ref 26.0–34.0)
Platelets: 242 10*3/uL (ref 150–400)
RBC: 4.03 MIL/uL (ref 3.87–5.11)

## 2012-12-19 LAB — LIPID PANEL
Cholesterol: 197 mg/dL (ref 0–200)
Total CHOL/HDL Ratio: 2.9 Ratio

## 2012-12-19 LAB — HEMOGLOBIN A1C: Hgb A1c MFr Bld: 7.3 % — ABNORMAL HIGH (ref <5.7)

## 2012-12-21 ENCOUNTER — Ambulatory Visit: Payer: Medicare Other | Admitting: Family Medicine

## 2012-12-31 ENCOUNTER — Encounter: Payer: Self-pay | Admitting: Family Medicine

## 2012-12-31 ENCOUNTER — Ambulatory Visit (INDEPENDENT_AMBULATORY_CARE_PROVIDER_SITE_OTHER): Payer: Medicare Other | Admitting: Family Medicine

## 2012-12-31 ENCOUNTER — Other Ambulatory Visit: Payer: Self-pay

## 2012-12-31 VITALS — BP 164/68 | HR 60 | Resp 16 | Ht 62.5 in | Wt 123.1 lb

## 2012-12-31 DIAGNOSIS — H60399 Other infective otitis externa, unspecified ear: Secondary | ICD-10-CM

## 2012-12-31 DIAGNOSIS — N182 Chronic kidney disease, stage 2 (mild): Secondary | ICD-10-CM

## 2012-12-31 DIAGNOSIS — E785 Hyperlipidemia, unspecified: Secondary | ICD-10-CM

## 2012-12-31 DIAGNOSIS — H6092 Unspecified otitis externa, left ear: Secondary | ICD-10-CM

## 2012-12-31 DIAGNOSIS — I10 Essential (primary) hypertension: Secondary | ICD-10-CM

## 2012-12-31 DIAGNOSIS — E119 Type 2 diabetes mellitus without complications: Secondary | ICD-10-CM

## 2012-12-31 MED ORDER — GLUCOSE BLOOD VI STRP: ORAL_STRIP | Status: DC

## 2012-12-31 MED ORDER — NEOMYCIN-POLYMYXIN-HC 3.5-10000-1 OT SOLN: 3.0000 [drp] | Freq: Four times a day (QID) | OTIC | Status: AC

## 2012-12-31 NOTE — Progress Notes (Signed)
  Subjective:    Patient ID: Betty [name], female    DOB: March 24, 1931, 79 y.o.   MRN: [id number]  HPI Pt here to f/u chronic medical problems. BP at home range120-160/50-70 DM- [id number] this AM, no hypoglycemia Good appetite, no concerns from family Hears echo in Left ear, noticed drainage from the ear past couple of days   Review of Systems  GEN- denies fatigue, fever, weight loss,weakness, recent illness HEENT- denies eye drainage, change in vision, nasal discharge, CVS- denies chest pain, palpitations RESP- denies SOB, cough, wheeze ABD- denies N/V, change in stools, abd pain GU- denies dysuria, hematuria, dribbling, incontinence MSK- denies joint pain, muscle aches, injury Neuro- denies headache, dizziness, syncope, seizure activity      Objective:   Physical Exam GEN- NAD, alert and oriented x3 HEENT- PERRL, EOMI, non injected sclera, pink conjunctiva, MMM, oropharynx clear, Left TM appears in tact, normal hearing of whishper, edema in canal with erythema and dry blood, Right TM in tact, no swelling noted  Neck- Supple,  CVS- RRR, no murmur RESP-CTAB ABD-NABS,soft,NT,ND EXT- No edema, no open lesions, good sensation Pulses- Radial, DP- 2+        Assessment & Plan:

## 2012-12-31 NOTE — Patient Instructions (Signed)
I will check into your test strips and lancets Use ear drop as directed for the the next week  Change your medications- Take Crestor, Xanax and metoprolol in the evening Take the aricept in the morning  A1C is 7.3%- Good Job with blood sugars Call me if your Echo does not improve in  Your ear  F/U 3 months

## 2013-01-02 DIAGNOSIS — H6090 Unspecified otitis externa, unspecified ear: Secondary | ICD-10-CM | POA: Insufficient documentation

## 2013-01-02 DIAGNOSIS — N183 Chronic kidney disease, stage 3 unspecified: Secondary | ICD-10-CM | POA: Insufficient documentation

## 2013-01-02 NOTE — Assessment & Plan Note (Signed)
LDL slightly above 100, no change to meds

## 2013-01-02 NOTE — Assessment & Plan Note (Signed)
Treat with antibiotic ear drops, she will call if not improved will have her see ENT

## 2013-01-02 NOTE — Assessment & Plan Note (Signed)
Goal BP for her age 77/90 new recommendations, on average this is what her BP runs, no change to meds

## 2013-01-02 NOTE — Assessment & Plan Note (Signed)
Due to HTN, DM

## 2013-01-02 NOTE — Assessment & Plan Note (Signed)
DM fairly well controlled A1C 7.3 no change to meds, she has some CKD

## 2013-03-06 ENCOUNTER — Other Ambulatory Visit: Payer: Self-pay | Admitting: Family Medicine

## 2013-04-01 ENCOUNTER — Ambulatory Visit (INDEPENDENT_AMBULATORY_CARE_PROVIDER_SITE_OTHER): Payer: Medicare Other | Admitting: Family Medicine

## 2013-04-01 ENCOUNTER — Ambulatory Visit: Payer: Medicare Other | Admitting: Family Medicine

## 2013-04-01 ENCOUNTER — Encounter: Payer: Self-pay | Admitting: Family Medicine

## 2013-04-01 VITALS — BP 138/70 | HR 78 | Temp 97.3°F | Resp 20 | Wt 122.0 lb

## 2013-04-01 DIAGNOSIS — E119 Type 2 diabetes mellitus without complications: Secondary | ICD-10-CM

## 2013-04-01 DIAGNOSIS — F03A0 Unspecified dementia, mild, without behavioral disturbance, psychotic disturbance, mood disturbance, and anxiety: Secondary | ICD-10-CM

## 2013-04-01 DIAGNOSIS — N182 Chronic kidney disease, stage 2 (mild): Secondary | ICD-10-CM

## 2013-04-01 DIAGNOSIS — F0390 Unspecified dementia without behavioral disturbance: Secondary | ICD-10-CM

## 2013-04-01 DIAGNOSIS — F411 Generalized anxiety disorder: Secondary | ICD-10-CM

## 2013-04-01 DIAGNOSIS — I10 Essential (primary) hypertension: Secondary | ICD-10-CM

## 2013-04-01 DIAGNOSIS — G4700 Insomnia, unspecified: Secondary | ICD-10-CM

## 2013-04-01 DIAGNOSIS — F5104 Psychophysiologic insomnia: Secondary | ICD-10-CM | POA: Insufficient documentation

## 2013-04-01 DIAGNOSIS — F419 Anxiety disorder, unspecified: Secondary | ICD-10-CM

## 2013-04-01 NOTE — Assessment & Plan Note (Signed)
Low dose xanax at bedtime

## 2013-04-01 NOTE — Assessment & Plan Note (Signed)
Uses xanax for sleep as needed

## 2013-04-01 NOTE — Assessment & Plan Note (Signed)
At baseline 

## 2013-04-01 NOTE — Assessment & Plan Note (Signed)
Doing well on aricept

## 2013-04-01 NOTE — Progress Notes (Signed)
  Subjective:    Patient ID: Betty [name], female    DOB: Mar 05, 1931, 79 y.o.   MRN: [id number]  HPI  Patient to follow chronic medical problems. She has no specific concerns. She is out of her Xanax which she uses at bedtime to help her sleep she takes a half a tablet. She is also out of her testing strips and could not get them from her pharmacy [facility]. Blood pressures at home have ranged from  120- 160/50- 70s. Denies any chest pain, SOB Medications reviewed, last set of labs reviewed  Review of Systems   GEN- denies fatigue, fever, weight loss,weakness, recent illness HEENT- denies eye drainage, change in vision, nasal discharge, CVS- denies chest pain, palpitations RESP- denies SOB, cough, wheeze ABD- denies N/V, change in stools, abd pain GU- denies dysuria, hematuria, dribbling, incontinence MSK- denies joint pain, muscle aches, injury Neuro- denies headache, dizziness, syncope, seizure activity      Objective:   Physical Exam GEN- NAD, alert and oriented x3 HEENT- PERRL, EOMI, non injected sclera, pink conjunctiva, MMM, oropharynx clear Neck- Supple,  CVS- RRR, no murmur RESP-CTAB ABD-NABS,soft,NT,ND EXT- No edema Pulses- Radial, DP- 2+        Assessment & Plan:

## 2013-04-01 NOTE — Assessment & Plan Note (Signed)
Well controlled 

## 2013-04-01 NOTE — Patient Instructions (Signed)
Continue current medications Lab work 1 week before next appt Medications refilled We will check on diabetic supplies F/U 3 months

## 2013-04-01 NOTE — Assessment & Plan Note (Signed)
Well controlled repeat A1C next visit and urine micro

## 2013-06-02 ENCOUNTER — Other Ambulatory Visit: Payer: Self-pay | Admitting: Family Medicine

## 2013-06-04 NOTE — Telephone Encounter (Signed)
Med phoned in °

## 2013-06-04 NOTE — Telephone Encounter (Signed)
Ok to refill 

## 2013-06-04 NOTE — Telephone Encounter (Signed)
Okay to refill? 

## 2013-06-24 ENCOUNTER — Other Ambulatory Visit: Payer: Medicare Other

## 2013-06-24 DIAGNOSIS — I10 Essential (primary) hypertension: Secondary | ICD-10-CM

## 2013-06-24 DIAGNOSIS — E119 Type 2 diabetes mellitus without complications: Secondary | ICD-10-CM

## 2013-06-24 DIAGNOSIS — E785 Hyperlipidemia, unspecified: Secondary | ICD-10-CM

## 2013-06-24 DIAGNOSIS — N184 Chronic kidney disease, stage 4 (severe): Secondary | ICD-10-CM

## 2013-06-24 LAB — CBC WITH DIFFERENTIAL/PLATELET
Eosinophils Absolute: 0 10*3/uL (ref 0.0–0.7)
Eosinophils Relative: 0 % (ref 0–5)
Hemoglobin: 10.5 g/dL — ABNORMAL LOW (ref 12.0–15.0)
Lymphs Abs: 2.4 10*3/uL (ref 0.7–4.0)
MCH: 26.7 pg (ref 26.0–34.0)
MCV: 80.9 fL (ref 78.0–100.0)
Monocytes Absolute: 0.5 10*3/uL (ref 0.1–1.0)
Monocytes Relative: 9 % (ref 3–12)
Platelets: 224 10*3/uL (ref 150–400)
RBC: 3.93 MIL/uL (ref 3.87–5.11)

## 2013-06-24 LAB — COMPLETE METABOLIC PANEL WITH GFR
ALT: <8 U/L (ref 0–35)
AST: 13 U/L (ref 0–37)
Creat: 1.3 mg/dL — ABNORMAL HIGH (ref 0.50–1.10)
GFR, Est African American: 45 mL/min — ABNORMAL LOW
Total Bilirubin: 0.5 mg/dL (ref 0.3–1.2)

## 2013-06-24 LAB — LIPID PANEL
Total CHOL/HDL Ratio: 3 Ratio
VLDL: 21 mg/dL (ref 0–40)

## 2013-06-24 LAB — HEMOGLOBIN A1C
Hgb A1c MFr Bld: 7.4 % — ABNORMAL HIGH (ref <5.7)
Mean Plasma Glucose: 166 mg/dL — ABNORMAL HIGH (ref <117)

## 2013-07-02 ENCOUNTER — Ambulatory Visit (INDEPENDENT_AMBULATORY_CARE_PROVIDER_SITE_OTHER): Payer: Medicare Other | Admitting: Family Medicine

## 2013-07-02 ENCOUNTER — Encounter: Payer: Self-pay | Admitting: Family Medicine

## 2013-07-02 VITALS — BP 150/80 | HR 88 | Temp 97.5°F | Resp 18 | Wt 121.5 lb

## 2013-07-02 DIAGNOSIS — I10 Essential (primary) hypertension: Secondary | ICD-10-CM

## 2013-07-02 DIAGNOSIS — E785 Hyperlipidemia, unspecified: Secondary | ICD-10-CM

## 2013-07-02 DIAGNOSIS — N183 Chronic kidney disease, stage 3 unspecified: Secondary | ICD-10-CM

## 2013-07-02 DIAGNOSIS — E119 Type 2 diabetes mellitus without complications: Secondary | ICD-10-CM

## 2013-07-02 DIAGNOSIS — Z23 Encounter for immunization: Secondary | ICD-10-CM

## 2013-07-02 LAB — MICROALBUMIN / CREATININE URINE RATIO
Creatinine, Urine: 188.3 mg/dL
Microalb Creat Ratio: 17.9 mg/g (ref 0.0–30.0)
Microalb, Ur: 3.37 mg/dL — ABNORMAL HIGH (ref 0.00–1.89)

## 2013-07-02 NOTE — Patient Instructions (Addendum)
F/u 4 months Continue meds Schedule Eye visit Flu shot given

## 2013-07-02 NOTE — Assessment & Plan Note (Signed)
A1c is 7.4 which is been fairly stable. 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 She also has some chronic kidney disease which is been overall stable For now we will continue her current medication She will schedule eye visit

## 2013-07-02 NOTE — Assessment & Plan Note (Signed)
No blood pressure medications taken today. Her home blood pressure readings look very good. She also has a component of whitecoat hypertension no change

## 2013-07-02 NOTE — Assessment & Plan Note (Signed)
Kidney function has been stable Will check Urine Micro She is on metformin may have to d/c this and switch to Glipizide

## 2013-07-02 NOTE — Assessment & Plan Note (Signed)
LDL is a little above goal but still stable. Based on her age and other comorbidities and her functioning we'll continue her to Crestor 20 mg her LFTs were normal

## 2013-07-02 NOTE — Progress Notes (Signed)
  Subjective:    Patient ID: Betty [name], female    DOB: 1931-06-14, 79 y.o.   MRN: [id number]  HPI  Patient here to follow chronic medical problems. She has no specific concerns today states she is doing well. She is due for flu shot. Hypertension her blood pressures have been running 105-144/ 46-69  At home . Diabetes mellitus no hypoglycemia symptoms no polyuria no polydipsia. Breasts grandson is with her today he has no specific concerns states she is doing well. She is due for an eye Dr. Visit. She did request that her Xanax be refilled which she takes at bedtime as needed for insomnia.  Fasting labs reviewed   Review of Systems  GEN- denies fatigue, fever, weight loss,weakness, recent illness HEENT- denies eye drainage, change in vision, nasal discharge, CVS- denies chest pain, palpitations RESP- denies SOB, cough, wheeze ABD- denies N/V, change in stools, abd pain GU- denies dysuria, hematuria, dribbling, incontinence MSK- denies joint pain, muscle aches, injury Neuro- denies headache, dizziness, syncope, seizure activity      Objective:   Physical Exam GEN- NAD, alert and oriented x3 HEENT- PERRL, EOMI, non injected sclera, pink conjunctiva, MMM, oropharynx clear Neck- Supple, no carotid bruit CVS- RRR, 1/6 SEM Right sternal border? RESP-CTAB EXT- No edema Pulses- Radial and DP equal bilaterally        Assessment & Plan:

## 2013-11-01 ENCOUNTER — Ambulatory Visit (INDEPENDENT_AMBULATORY_CARE_PROVIDER_SITE_OTHER): Payer: Medicare Other | Admitting: Family Medicine

## 2013-11-01 ENCOUNTER — Encounter: Payer: Self-pay | Admitting: Family Medicine

## 2013-11-01 VITALS — BP 154/76 | HR 88 | Temp 97.6°F | Resp 18 | Ht 61.0 in | Wt 121.0 lb

## 2013-11-01 DIAGNOSIS — F0390 Unspecified dementia without behavioral disturbance: Secondary | ICD-10-CM

## 2013-11-01 DIAGNOSIS — F5104 Psychophysiologic insomnia: Secondary | ICD-10-CM

## 2013-11-01 DIAGNOSIS — G4700 Insomnia, unspecified: Secondary | ICD-10-CM

## 2013-11-01 DIAGNOSIS — E119 Type 2 diabetes mellitus without complications: Secondary | ICD-10-CM

## 2013-11-01 DIAGNOSIS — E785 Hyperlipidemia, unspecified: Secondary | ICD-10-CM

## 2013-11-01 DIAGNOSIS — Z23 Encounter for immunization: Secondary | ICD-10-CM

## 2013-11-01 DIAGNOSIS — F03A0 Unspecified dementia, mild, without behavioral disturbance, psychotic disturbance, mood disturbance, and anxiety: Secondary | ICD-10-CM

## 2013-11-01 DIAGNOSIS — R634 Abnormal weight loss: Secondary | ICD-10-CM

## 2013-11-01 DIAGNOSIS — I10 Essential (primary) hypertension: Secondary | ICD-10-CM

## 2013-11-01 LAB — CBC WITH DIFFERENTIAL/PLATELET
Basophils Absolute: 0 10*3/uL (ref 0.0–0.1)
Basophils Relative: 0 % (ref 0–1)
EOS ABS: 0 10*3/uL (ref 0.0–0.7)
Eosinophils Relative: 0 % (ref 0–5)
HEMATOCRIT: 33.1 % — AB (ref 36.0–46.0)
HEMOGLOBIN: 10.8 g/dL — AB (ref 12.0–15.0)
LYMPHS ABS: 1.5 10*3/uL (ref 0.7–4.0)
Lymphocytes Relative: 24 % (ref 12–46)
MCH: 26.5 pg (ref 26.0–34.0)
MCHC: 32.6 g/dL (ref 30.0–36.0)
MCV: 81.1 fL (ref 78.0–100.0)
MONO ABS: 0.5 10*3/uL (ref 0.1–1.0)
MONOS PCT: 7 % (ref 3–12)
NEUTROS ABS: 4.3 10*3/uL (ref 1.7–7.7)
NEUTROS PCT: 69 % (ref 43–77)
Platelets: 240 10*3/uL (ref 150–400)
RBC: 4.08 MIL/uL (ref 3.87–5.11)
RDW: 15.1 % (ref 11.5–15.5)
WBC: 6.3 10*3/uL (ref 4.0–10.5)

## 2013-11-01 LAB — COMPREHENSIVE METABOLIC PANEL
ALBUMIN: 4.2 g/dL (ref 3.5–5.2)
ALT: 9 U/L (ref 0–35)
AST: 15 U/L (ref 0–37)
Alkaline Phosphatase: 54 U/L (ref 39–117)
BUN: 27 mg/dL — ABNORMAL HIGH (ref 6–23)
CO2: 28 meq/L (ref 19–32)
Calcium: 9.7 mg/dL (ref 8.4–10.5)
Chloride: 98 mEq/L (ref 96–112)
Creat: 1.27 mg/dL — ABNORMAL HIGH (ref 0.50–1.10)
GLUCOSE: 175 mg/dL — AB (ref 70–99)
POTASSIUM: 3.5 meq/L (ref 3.5–5.3)
SODIUM: 135 meq/L (ref 135–145)
TOTAL PROTEIN: 7 g/dL (ref 6.0–8.3)
Total Bilirubin: 0.5 mg/dL (ref 0.2–1.2)

## 2013-11-01 LAB — LIPID PANEL
CHOLESTEROL: 185 mg/dL (ref 0–200)
HDL: 76 mg/dL (ref >39)
LDL Cholesterol: 89 mg/dL (ref 0–99)
TRIGLYCERIDES: 98 mg/dL (ref <150)
Total CHOL/HDL Ratio: 2.4 Ratio
VLDL: 20 mg/dL (ref 0–40)

## 2013-11-01 LAB — HEMOGLOBIN A1C
HEMOGLOBIN A1C: 7.6 % — AB (ref <5.7)
Mean Plasma Glucose: 171 mg/dL — ABNORMAL HIGH (ref <117)

## 2013-11-01 MED ORDER — DONEPEZIL HCL 5 MG PO TABS: ORAL_TABLET | ORAL | Status: DC

## 2013-11-01 MED ORDER — ALPRAZOLAM 0.5 MG PO TABS: 0.5000 mg | ORAL_TABLET | Freq: Every evening | ORAL | Status: DC | PRN

## 2013-11-01 MED ORDER — ROSUVASTATIN CALCIUM 20 MG PO TABS: 20.0000 mg | ORAL_TABLET | Freq: Every day | ORAL | Status: DC

## 2013-11-01 NOTE — Assessment & Plan Note (Signed)
Home blood pressure readings look good I will continue her on the current medications I think that her blood pressure can be around 140-150 safely

## 2013-11-01 NOTE — Assessment & Plan Note (Signed)
A recheck her lipid panel today as well as her liver function tests and she is on Crestor

## 2013-11-01 NOTE — Patient Instructions (Signed)
We will call with lab results Okay to use ensure twice a day Schedule eye exam Continue meds Prevnar 13 given F/U 4 months

## 2013-11-01 NOTE — Assessment & Plan Note (Addendum)
We'll check A1c as well as metabolic panel she's currently on metformin as well as losartan and statin drug Prevnar 13 given She has an eye Dr. appointment scheduled for next Tuesday  New meter given, test CBG daily

## 2013-11-01 NOTE — Assessment & Plan Note (Signed)
Weight back at baseline Continue ensure or glucerna

## 2013-11-01 NOTE — Progress Notes (Signed)
   Subjective:    Patient ID: Betty [name], female    DOB: June 26, 1931, 80 y.o.   MRN: [id number]  HPI  Patient here to follow chronic medical problems. She has no specific concerns. She states overall she's been doing well. She did have a month or so where appetite had decreased however she did not feel sick and she was only eating half of her portions she states that her weight got down to 106 pounds but since then she added Ensure and she is now eating full meals at least 3 times a day her son agrees to this. Her meter no longer works and she needs a new diabetic beader therefore she's not been testing her blood sugar but she has been taking metformin twice a day.  She again did not take her blood pressure medications today but has been checking it at home her systolic is been [id number] to [id number]. She denies any chest pain, dizziness, headache  Her son states she's been doing well with the Aricept  Review of Systems  GEN- denies fatigue, fever, weight loss,weakness, recent illness HEENT- denies eye drainage, change in vision, nasal discharge, CVS- denies chest pain, palpitations RESP- denies SOB, cough, wheeze ABD- denies N/V, change in stools, abd pain GU- denies dysuria, hematuria, dribbling, incontinence MSK- denies joint pain, muscle aches, injury Neuro- denies headache, dizziness, syncope, seizure activity      Objective:   Physical Exam  GEN- NAD, alert and oriented x3 HEENT- PERRL, EOMI, non injected sclera, pink conjunctiva, MMM, oropharynx clear Neck- Supple, no carotid bruit CVS- RRR, 1/6 SEM Right sternal border RESP-CTAB ABD-NABS,soft, NT,ND EXT- No edema Pulses- Radial and DP equal bilaterally       Assessment & Plan:

## 2013-11-01 NOTE — Assessment & Plan Note (Signed)
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

## 2013-11-01 NOTE — Assessment & Plan Note (Signed)
She's doing well on low-dose Xanax which she uses at bedtime as needed or insomnia as well as anxiety

## 2013-11-03 MED ORDER — GLIPIZIDE 5 MG PO TABS: 5.0000 mg | ORAL_TABLET | Freq: Two times a day (BID) | ORAL | Status: DC

## 2013-11-03 NOTE — Addendum Note (Signed)
Addended by: Vic Blackbird F on: 11/03/2013 08:12 PM   Modules accepted: Orders

## 2013-11-03 NOTE — Addendum Note (Signed)
Addended by: Vic Blackbird F on: 11/03/2013 08:14 PM   Modules accepted: Orders

## 2014-01-31 ENCOUNTER — Other Ambulatory Visit: Payer: Self-pay | Admitting: Family Medicine

## 2014-01-31 NOTE — Telephone Encounter (Signed)
Medication refilled per protocol. 

## 2014-02-28 ENCOUNTER — Other Ambulatory Visit: Payer: Self-pay | Admitting: Family Medicine

## 2014-02-28 NOTE — Telephone Encounter (Signed)
Refill appropriate and filled per protocol. 

## 2014-03-03 ENCOUNTER — Ambulatory Visit (INDEPENDENT_AMBULATORY_CARE_PROVIDER_SITE_OTHER): Payer: Medicare Other | Admitting: Family Medicine

## 2014-03-03 ENCOUNTER — Other Ambulatory Visit: Payer: Self-pay | Admitting: *Deleted

## 2014-03-03 ENCOUNTER — Encounter: Payer: Self-pay | Admitting: Family Medicine

## 2014-03-03 VITALS — BP 146/70 | HR 72 | Temp 98.5°F | Resp 16 | Ht 62.0 in | Wt 125.0 lb

## 2014-03-03 DIAGNOSIS — I10 Essential (primary) hypertension: Secondary | ICD-10-CM

## 2014-03-03 DIAGNOSIS — N183 Chronic kidney disease, stage 3 unspecified: Secondary | ICD-10-CM

## 2014-03-03 DIAGNOSIS — E785 Hyperlipidemia, unspecified: Secondary | ICD-10-CM

## 2014-03-03 DIAGNOSIS — G4700 Insomnia, unspecified: Secondary | ICD-10-CM

## 2014-03-03 DIAGNOSIS — E119 Type 2 diabetes mellitus without complications: Secondary | ICD-10-CM

## 2014-03-03 DIAGNOSIS — F5104 Psychophysiologic insomnia: Secondary | ICD-10-CM

## 2014-03-03 LAB — COMPREHENSIVE METABOLIC PANEL
ALK PHOS: 45 U/L (ref 39–117)
ALT: 12 U/L (ref 0–35)
AST: 13 U/L (ref 0–37)
Albumin: 3.9 g/dL (ref 3.5–5.2)
BUN: 34 mg/dL — AB (ref 6–23)
CO2: 27 mEq/L (ref 19–32)
Calcium: 9.5 mg/dL (ref 8.4–10.5)
Chloride: 105 mEq/L (ref 96–112)
Creat: 1.46 mg/dL — ABNORMAL HIGH (ref 0.50–1.10)
Glucose, Bld: 114 mg/dL — ABNORMAL HIGH (ref 70–99)
Potassium: 3.8 mEq/L (ref 3.5–5.3)
Sodium: 143 mEq/L (ref 135–145)
Total Bilirubin: 0.4 mg/dL (ref 0.2–1.2)
Total Protein: 6.7 g/dL (ref 6.0–8.3)

## 2014-03-03 LAB — LIPID PANEL
CHOL/HDL RATIO: 2.5 ratio
CHOLESTEROL: 160 mg/dL (ref 0–200)
HDL: 63 mg/dL (ref >39)
LDL Cholesterol: 82 mg/dL (ref 0–99)
Triglycerides: 76 mg/dL (ref <150)
VLDL: 15 mg/dL (ref 0–40)

## 2014-03-03 LAB — CBC WITH DIFFERENTIAL/PLATELET
BASOS ABS: 0 10*3/uL (ref 0.0–0.1)
BASOS PCT: 0 % (ref 0–1)
Eosinophils Absolute: 0 10*3/uL (ref 0.0–0.7)
Eosinophils Relative: 0 % (ref 0–5)
HCT: 30.6 % — ABNORMAL LOW (ref 36.0–46.0)
Hemoglobin: 10.2 g/dL — ABNORMAL LOW (ref 12.0–15.0)
Lymphocytes Relative: 26 % (ref 12–46)
Lymphs Abs: 1.4 10*3/uL (ref 0.7–4.0)
MCH: 27 pg (ref 26.0–34.0)
MCHC: 33.3 g/dL (ref 30.0–36.0)
MCV: 81 fL (ref 78.0–100.0)
MONO ABS: 0.5 10*3/uL (ref 0.1–1.0)
Monocytes Relative: 9 % (ref 3–12)
NEUTROS ABS: 3.4 10*3/uL (ref 1.7–7.7)
Neutrophils Relative %: 65 % (ref 43–77)
PLATELETS: 204 10*3/uL (ref 150–400)
RBC: 3.78 MIL/uL — ABNORMAL LOW (ref 3.87–5.11)
RDW: 15 % (ref 11.5–15.5)
WBC: 5.3 10*3/uL (ref 4.0–10.5)

## 2014-03-03 LAB — HEMOGLOBIN A1C
Hgb A1c MFr Bld: 7.2 % — ABNORMAL HIGH (ref <5.7)
Mean Plasma Glucose: 160 mg/dL — ABNORMAL HIGH (ref <117)

## 2014-03-03 MED ORDER — ROSUVASTATIN CALCIUM 20 MG PO TABS: 20.0000 mg | ORAL_TABLET | Freq: Every day | ORAL | Status: DC

## 2014-03-03 MED ORDER — ALPRAZOLAM 0.25 MG PO TABS: 0.2500 mg | ORAL_TABLET | Freq: Every evening | ORAL | Status: DC | PRN

## 2014-03-03 MED ORDER — ALPRAZOLAM 0.5 MG PO TABS: 0.5000 mg | ORAL_TABLET | Freq: Every evening | ORAL | Status: DC | PRN

## 2014-03-03 NOTE — Assessment & Plan Note (Signed)
Recheck lipid panel she has been without Crestor for a couple weeks.

## 2014-03-03 NOTE — Telephone Encounter (Signed)
Refill appropriate and filled per protocol. 

## 2014-03-03 NOTE — Assessment & Plan Note (Signed)
A1c goals less than 8%. She will continue glipizide 5 mg twice a day I did check her labs today.

## 2014-03-03 NOTE — Progress Notes (Signed)
Patient ID: Betty [name], female   DOB: [birth year], 80 y.o.   MRN: [id number]   Subjective:    Patient ID: Betty [name], female    DOB: [birth year], 80 y.o.   MRN: [id number]  Patient presents for 4 month F/U  patient here to follow chronic medical problems. She has no specific concerns. Her son will like me to review her medications this he thinks that she has a couple wrong at home. She does have a list stating that she is taking metformin however I discontinued this a couple months ago secondary to her creatinine. She was started on glipizide which she does have as well. Her fasting blood sugars have ranged from 90-116 her blood pressures at home have been 118-139/46-62. No hypoglycemia no dizziness no headache no chest pain or shortness of breath    Review Of Systems:  GEN- denies fatigue, fever, weight loss,weakness, recent illness HEENT- denies eye drainage, change in vision, nasal discharge, CVS- denies chest pain, palpitations RESP- denies SOB, cough, wheeze ABD- denies N/V, change in stools, abd pain GU- denies dysuria, hematuria, dribbling, incontinence MSK- denies joint pain, muscle aches, injury Neuro- denies headache, dizziness, syncope, seizure activity       Objective:    BP 146/70  Pulse 72  Temp(Src) 98.5 F (36.9 C) (Oral)  Resp 16  Ht 5\' 2"  (1.575 m)  Wt 125 lb (56.7 kg)  BMI 22.86 kg/m2 GEN- NAD, alert and oriented x3 HEENT- PERRL, EOMI, non injected sclera, pink conjunctiva, MMM, oropharynx clear Neck- Supple,  CVS- RRR,2/6 SEM Right sternal border RESP-CTAB EXT- No edema Pulses- Radial and DP equal bilaterally       Assessment & Plan:      Problem List Items Addressed This Visit   None      Note: This dictation was prepared with Dragon dictation along with smaller phrase technology. Any transcriptional errors that result from this process are unintentional.

## 2014-03-03 NOTE — Assessment & Plan Note (Signed)
Rare use of alprazolam

## 2014-03-03 NOTE — Assessment & Plan Note (Signed)
Blood pressure is well controlled on current medications. 

## 2014-03-03 NOTE — Patient Instructions (Signed)
Continue current medications  We will call with lab results Cross check her medications She should not have Metformin F/U 4 months

## 2014-03-06 ENCOUNTER — Other Ambulatory Visit: Payer: Self-pay | Admitting: *Deleted

## 2014-03-06 MED ORDER — METOPROLOL TARTRATE 25 MG PO TABS: ORAL_TABLET | ORAL | Status: DC

## 2014-03-06 MED ORDER — AMLODIPINE BESYLATE 5 MG PO TABS: ORAL_TABLET | ORAL | Status: DC

## 2014-03-06 NOTE — Telephone Encounter (Signed)
Refill appropriate and filled per protocol. 

## 2014-05-31 ENCOUNTER — Other Ambulatory Visit: Payer: Self-pay | Admitting: Family Medicine

## 2014-05-31 ENCOUNTER — Other Ambulatory Visit: Payer: Self-pay | Admitting: *Deleted

## 2014-05-31 MED ORDER — GLIPIZIDE 5 MG PO TABS: ORAL_TABLET | ORAL | Status: DC

## 2014-05-31 NOTE — Telephone Encounter (Signed)
Received fax requesting refill on Glipizide.   Refill appropriate and filled per protocol.

## 2014-05-31 NOTE — Telephone Encounter (Signed)
Refill appropriate and filled per protocol. 

## 2014-06-30 ENCOUNTER — Other Ambulatory Visit: Payer: Self-pay | Admitting: *Deleted

## 2014-06-30 MED ORDER — AMLODIPINE BESYLATE 5 MG PO TABS: ORAL_TABLET | ORAL | Status: DC

## 2014-06-30 NOTE — Telephone Encounter (Signed)
Received fax requesting refill on Norvasc.   Refill appropriate and filled per protocol.  

## 2014-07-04 ENCOUNTER — Encounter: Payer: Self-pay | Admitting: Family Medicine

## 2014-07-04 ENCOUNTER — Ambulatory Visit (INDEPENDENT_AMBULATORY_CARE_PROVIDER_SITE_OTHER): Payer: Medicare Other | Admitting: Family Medicine

## 2014-07-04 VITALS — BP 130/80 | HR 62 | Temp 98.0°F | Resp 18 | Ht 61.0 in | Wt 138.0 lb

## 2014-07-04 DIAGNOSIS — E1122 Type 2 diabetes mellitus with diabetic chronic kidney disease: Secondary | ICD-10-CM

## 2014-07-04 DIAGNOSIS — F03A0 Unspecified dementia, mild, without behavioral disturbance, psychotic disturbance, mood disturbance, and anxiety: Secondary | ICD-10-CM

## 2014-07-04 DIAGNOSIS — F0390 Unspecified dementia without behavioral disturbance: Secondary | ICD-10-CM

## 2014-07-04 DIAGNOSIS — I10 Essential (primary) hypertension: Secondary | ICD-10-CM

## 2014-07-04 DIAGNOSIS — N189 Chronic kidney disease, unspecified: Secondary | ICD-10-CM

## 2014-07-04 DIAGNOSIS — E785 Hyperlipidemia, unspecified: Secondary | ICD-10-CM

## 2014-07-04 DIAGNOSIS — G4700 Insomnia, unspecified: Secondary | ICD-10-CM

## 2014-07-04 DIAGNOSIS — F5104 Psychophysiologic insomnia: Secondary | ICD-10-CM

## 2014-07-04 DIAGNOSIS — N183 Chronic kidney disease, stage 3 unspecified: Secondary | ICD-10-CM

## 2014-07-04 LAB — URINALYSIS, ROUTINE W REFLEX MICROSCOPIC
Bilirubin Urine: NEGATIVE
Glucose, UA: NEGATIVE mg/dL
Hgb urine dipstick: NEGATIVE
Ketones, ur: NEGATIVE mg/dL
NITRITE: NEGATIVE
PROTEIN: NEGATIVE mg/dL
SPECIFIC GRAVITY, URINE: 1.014 (ref 1.005–1.030)
Urobilinogen, UA: 0.2 mg/dL (ref 0.0–1.0)
pH: 7 (ref 5.0–8.0)

## 2014-07-04 LAB — LIPID PANEL
CHOL/HDL RATIO: 2.7 ratio
CHOLESTEROL: 219 mg/dL — AB (ref 0–200)
HDL: 80 mg/dL (ref >39)
LDL CALC: 123 mg/dL — AB (ref 0–99)
Triglycerides: 80 mg/dL (ref <150)
VLDL: 16 mg/dL (ref 0–40)

## 2014-07-04 LAB — TSH: TSH: 0.701 u[IU]/mL (ref 0.350–4.500)

## 2014-07-04 LAB — URINALYSIS, MICROSCOPIC ONLY
BACTERIA UA: NONE SEEN
CASTS: NONE SEEN
Crystals: NONE SEEN
Squamous Epithelial / LPF: NONE SEEN

## 2014-07-04 LAB — MICROALBUMIN, URINE: MICROALB UR: 6.7 mg/dL — AB (ref <2.0)

## 2014-07-04 LAB — CBC WITH DIFFERENTIAL/PLATELET
BASOS ABS: 0 10*3/uL (ref 0.0–0.1)
BASOS PCT: 0 % (ref 0–1)
Eosinophils Absolute: 0 10*3/uL (ref 0.0–0.7)
Eosinophils Relative: 0 % (ref 0–5)
HEMATOCRIT: 32.8 % — AB (ref 36.0–46.0)
HEMOGLOBIN: 10.7 g/dL — AB (ref 12.0–15.0)
Lymphocytes Relative: 22 % (ref 12–46)
Lymphs Abs: 1.3 10*3/uL (ref 0.7–4.0)
MCH: 26.6 pg (ref 26.0–34.0)
MCHC: 32.6 g/dL (ref 30.0–36.0)
MCV: 81.4 fL (ref 78.0–100.0)
MONOS PCT: 7 % (ref 3–12)
Monocytes Absolute: 0.4 10*3/uL (ref 0.1–1.0)
NEUTROS ABS: 4.1 10*3/uL (ref 1.7–7.7)
NEUTROS PCT: 71 % (ref 43–77)
Platelets: 224 10*3/uL (ref 150–400)
RBC: 4.03 MIL/uL (ref 3.87–5.11)
RDW: 15.1 % (ref 11.5–15.5)
WBC: 5.8 10*3/uL (ref 4.0–10.5)

## 2014-07-04 LAB — COMPREHENSIVE METABOLIC PANEL
ALBUMIN: 4.3 g/dL (ref 3.5–5.2)
ALK PHOS: 59 U/L (ref 39–117)
ALT: 13 U/L (ref 0–35)
AST: 17 U/L (ref 0–37)
BUN: 23 mg/dL (ref 6–23)
CHLORIDE: 104 meq/L (ref 96–112)
CO2: 27 mEq/L (ref 19–32)
Calcium: 9.4 mg/dL (ref 8.4–10.5)
Creat: 1.15 mg/dL — ABNORMAL HIGH (ref 0.50–1.10)
Glucose, Bld: 141 mg/dL — ABNORMAL HIGH (ref 70–99)
POTASSIUM: 4.1 meq/L (ref 3.5–5.3)
Sodium: 141 mEq/L (ref 135–145)
TOTAL PROTEIN: 7 g/dL (ref 6.0–8.3)
Total Bilirubin: 0.5 mg/dL (ref 0.2–1.2)

## 2014-07-04 LAB — HEMOGLOBIN A1C
Hgb A1c MFr Bld: 6.6 % — ABNORMAL HIGH (ref <5.7)
Mean Plasma Glucose: 143 mg/dL — ABNORMAL HIGH (ref <117)

## 2014-07-04 MED ORDER — METOPROLOL TARTRATE 25 MG PO TABS: ORAL_TABLET | ORAL | Status: DC

## 2014-07-04 MED ORDER — LOSARTAN POTASSIUM-HCTZ 100-25 MG PO TABS: ORAL_TABLET | ORAL | Status: DC

## 2014-07-04 MED ORDER — AMLODIPINE BESYLATE 5 MG PO TABS: ORAL_TABLET | ORAL | Status: DC

## 2014-07-04 MED ORDER — ALPRAZOLAM 0.25 MG PO TABS: 0.2500 mg | ORAL_TABLET | Freq: Every evening | ORAL | Status: DC | PRN

## 2014-07-04 MED ORDER — DONEPEZIL HCL 10 MG PO TABS: ORAL_TABLET | ORAL | Status: DC

## 2014-07-04 MED ORDER — ROSUVASTATIN CALCIUM 20 MG PO TABS: 20.0000 mg | ORAL_TABLET | Freq: Every day | ORAL | Status: DC

## 2014-07-04 NOTE — Patient Instructions (Signed)
Continue current medications Give Xanax around 7pm to help ease anxiety for bedtime Aricept increased to 10mg  PCS service form to be completed F/U 8 weeks for medications

## 2014-07-05 NOTE — Assessment & Plan Note (Signed)
Continue xanax at bedtime

## 2014-07-05 NOTE — Assessment & Plan Note (Signed)
Recheck A1C, adjust meds, discussed with daughter they need to handle her medications

## 2014-07-05 NOTE — Assessment & Plan Note (Signed)
Increase aricept to 10mg  Will see how she does with this, may need to add Namenda PCS form to be completed

## 2014-07-05 NOTE — Progress Notes (Signed)
Patient ID: Betty [name], female   DOB: 03-09-1931, 80 y.o.   MRN: [id number]   Subjective:    Patient ID: Betty [name], female    DOB: [birth year], 80 y.o.   MRN: [id number]  Patient presents for Follow-up DM- CBG fasting 97-113, no hypoglycemia, however she has 3 bottles of glipizide with her dating back to august, which it seems she is not taking on regular basis. Daughter states she thinks her diabetes pill is a laxative and skips it at times goal a1c < 8%, daughter unsure if any readings correct, they are concerned her dementia is worsening, she gets confused outside of the home, will often talk about her son to others and strangers , she is fearful when she is at home alone and has some odd behaviors with turning off lights and checking behind doors. Daughter would like PCS services during day, son is with her at night, neighbors also check in during the day     Review Of Systems:  GEN- denies fatigue, fever, weight loss,weakness, recent illness HEENT- denies eye drainage, change in vision, nasal discharge, CVS- denies chest pain, palpitations RESP- denies SOB, cough, wheeze ABD- denies N/V, change in stools, abd pain GU- denies dysuria, hematuria, dribbling, incontinence MSK- denies joint pain, muscle aches, injury Neuro- denies headache, dizziness, syncope, seizure activity       Objective:    BP 130/80  Pulse 62  Temp(Src) 98 F (36.7 C) (Oral)  Resp 18  Ht 5\' 1"  (1.549 m)  Wt 138 lb (62.596 kg)  BMI 26.09 kg/m2 GEN- NAD, alert and oriented x3 HEENT- PERRL, EOMI, non injected sclera, pink conjunctiva, MMM, oropharynx clear Neck- Supple, no thyromegaly CVS- RRR, 2/6 SEM RESP-CTAB Psych- normal affect and mood ,then became agititated with daughter discussed her memory MMSE- 25/30, unable to recall words, normal speech EXT- No edema Pulses- Radial, DP- 2+        Assessment & Plan:      Problem List Items Addressed This Visit   Type II diabetes  mellitus with renal manifestations     Recheck A1C, adjust meds, discussed with daughter they need to handle her medications     Relevant Medications      losartan-hydrochlorothiazide (HYZAAR) 100-25 MG per tablet      rosuvastatin (CRESTOR) tablet   Other Relevant Orders      Hemoglobin A1c (Completed)      Microalbumin, urine (Completed)   Mild dementia - Primary     Increase aricept to 10mg  Will see how she does with this, may need to add Namenda PCS form to be completed    Relevant Medications      ALPRAZolam  (XANAX) tablet      donepezil (ARICEPT) tablet   Hyperlipidemia   Relevant Medications      losartan-hydrochlorothiazide (HYZAAR) 100-25 MG per tablet      rosuvastatin (CRESTOR) tablet      amLODIpine (NORVASC) tablet      metoprolol tartrate (LOPRESSOR) tablet   Other Relevant Orders      Lipid panel (Completed)   Essential hypertension, benign     Well controlled home readings average 110-130/50-52    Relevant Medications      losartan-hydrochlorothiazide (HYZAAR) 100-25 MG per tablet      rosuvastatin (CRESTOR) tablet      amLODIpine (NORVASC) tablet      metoprolol tartrate (LOPRESSOR) tablet   Other Relevant Orders      CBC with Differential (Completed)  Comprehensive metabolic panel (Completed)      TSH (Completed)   CKD (chronic kidney disease), stage III   Relevant Orders      Urinalysis, Routine w reflex microscopic (Completed)   Chronic insomnia     Continue xanax at bedtime       Note: This dictation was prepared with Dragon dictation along with smaller phrase technology. Any transcriptional errors that result from this process are unintentional.

## 2014-07-05 NOTE — Assessment & Plan Note (Signed)
Well controlled home readings average 110-130/50-52

## 2014-07-30 ENCOUNTER — Ambulatory Visit (INDEPENDENT_AMBULATORY_CARE_PROVIDER_SITE_OTHER): Payer: Medicare Other | Admitting: Family Medicine

## 2014-07-30 ENCOUNTER — Encounter: Payer: Self-pay | Admitting: Family Medicine

## 2014-07-30 VITALS — BP 136/68 | HR 64 | Temp 98.1°F | Resp 18 | Ht 62.0 in | Wt 132.0 lb

## 2014-07-30 DIAGNOSIS — E1122 Type 2 diabetes mellitus with diabetic chronic kidney disease: Secondary | ICD-10-CM

## 2014-07-30 DIAGNOSIS — Z23 Encounter for immunization: Secondary | ICD-10-CM

## 2014-07-30 DIAGNOSIS — F03A0 Unspecified dementia, mild, without behavioral disturbance, psychotic disturbance, mood disturbance, and anxiety: Secondary | ICD-10-CM

## 2014-07-30 DIAGNOSIS — F0390 Unspecified dementia without behavioral disturbance: Secondary | ICD-10-CM

## 2014-07-30 DIAGNOSIS — N189 Chronic kidney disease, unspecified: Secondary | ICD-10-CM

## 2014-07-30 NOTE — Patient Instructions (Signed)
Continue current medications Flu shot given F/U 4 months

## 2014-07-30 NOTE — Assessment & Plan Note (Signed)
Continue aricept at current dose Family members dispensing medications

## 2014-07-30 NOTE — Assessment & Plan Note (Signed)
Will see how she does with glipizide at 5mg  with breafast Fasting sugars have been good per report

## 2014-07-30 NOTE — Progress Notes (Signed)
Patient ID: Betty [name], female   DOB: Mar 25, 1931, 80 y.o.   MRN: [id number]   Subjective:    Patient ID: Betty [name], female    DOB: 1931-08-16, 80 y.o.   MRN: [id number]  Patient presents for Follow-up  patient here for interim follow-up. She is actually here a few weeks early. I increased her Aricept to 10 mg her last visit due to some change in cognitive function today she is here with her son who states that she's been doing fairly well at home as he is taking over her medication dispenser so. She did not qualify for personal care services. The family is present to help mother throughout the day and also call to check in on her regular basis. We have no new concerns today. They also decrease her glipizide to 5 mg with breakfast as I instructed to prevent hypoglycemia as her A1c was down to 6.6%    Review Of Systems:  GEN- denies fatigue, fever, weight loss,weakness, recent illness HEENT- denies eye drainage, change in vision, nasal discharge, CVS- denies chest pain, palpitations RESP- denies SOB, cough, wheeze ABD- denies N/V, change in stools, abd pain GU- denies dysuria, hematuria, dribbling, incontinence MSK- denies joint pain, muscle aches, injury Neuro- denies headache, dizziness, syncope, seizure activity       Objective:    BP 136/68  Pulse 64  Temp(Src) 98.1 F (36.7 C) (Oral)  Resp 18  Ht 5\' 2"  (1.575 m)  Wt 132 lb (59.875 kg)  BMI 24.14 kg/m2 GEN- NAD, alert and oriented x3 HEENT- PERRL, EOMI, non injected sclera, pink conjunctiva, MMM, oropharynx clear CVS- RRR, 2/6 SEM RESP-CTAB EXT- No edema Pulses- Radial 2+        Assessment & Plan:      Problem List Items Addressed This Visit   None    Visit Diagnoses   Need for prophylactic vaccination and inoculation against influenza    -  Primary    Relevant Medications       glipiZIDE (GLUCOTROL) 5 MG tablet    Other Relevant Orders       Flu Vaccine QUAD 36+ mos IM (Completed)       Note: This dictation was prepared with Dragon dictation along with smaller phrase technology. Any transcriptional errors that result from this process are unintentional.

## 2014-08-07 ENCOUNTER — Telehealth: Payer: Self-pay | Admitting: Family Medicine

## 2014-08-07 NOTE — Telephone Encounter (Signed)
Call placed to patient.   States that she needed medications refilled, but the pharmacy does have authorization to refill.   Patient son is picking up prescriptions.

## 2014-08-07 NOTE — Telephone Encounter (Signed)
Patients family calling regarding prescriptions for this patient (left message) please call back at [phone]

## 2014-09-01 ENCOUNTER — Ambulatory Visit: Payer: Medicare Other | Admitting: Family Medicine

## 2014-11-04 ENCOUNTER — Other Ambulatory Visit: Payer: Self-pay | Admitting: *Deleted

## 2014-12-01 ENCOUNTER — Ambulatory Visit: Payer: Medicare Other | Admitting: Family Medicine

## 2014-12-02 ENCOUNTER — Encounter: Payer: Self-pay | Admitting: Family Medicine

## 2014-12-02 ENCOUNTER — Ambulatory Visit (INDEPENDENT_AMBULATORY_CARE_PROVIDER_SITE_OTHER): Payer: Medicare Other | Admitting: Family Medicine

## 2014-12-02 VITALS — BP 130/68 | HR 64 | Temp 97.4°F | Resp 14 | Ht 62.0 in | Wt 132.0 lb

## 2014-12-02 DIAGNOSIS — N183 Chronic kidney disease, stage 3 unspecified: Secondary | ICD-10-CM

## 2014-12-02 DIAGNOSIS — N189 Chronic kidney disease, unspecified: Secondary | ICD-10-CM

## 2014-12-02 DIAGNOSIS — R197 Diarrhea, unspecified: Secondary | ICD-10-CM | POA: Diagnosis not present

## 2014-12-02 DIAGNOSIS — E1122 Type 2 diabetes mellitus with diabetic chronic kidney disease: Secondary | ICD-10-CM

## 2014-12-02 DIAGNOSIS — E785 Hyperlipidemia, unspecified: Secondary | ICD-10-CM | POA: Diagnosis not present

## 2014-12-02 DIAGNOSIS — F0390 Unspecified dementia without behavioral disturbance: Secondary | ICD-10-CM | POA: Diagnosis not present

## 2014-12-02 DIAGNOSIS — I10 Essential (primary) hypertension: Secondary | ICD-10-CM

## 2014-12-02 DIAGNOSIS — F03A0 Unspecified dementia, mild, without behavioral disturbance, psychotic disturbance, mood disturbance, and anxiety: Secondary | ICD-10-CM

## 2014-12-02 LAB — CBC WITH DIFFERENTIAL/PLATELET
BASOS PCT: 0 % (ref 0–1)
Basophils Absolute: 0 10*3/uL (ref 0.0–0.1)
EOS ABS: 0 10*3/uL (ref 0.0–0.7)
Eosinophils Relative: 0 % (ref 0–5)
HCT: 32.9 % — ABNORMAL LOW (ref 36.0–46.0)
HEMOGLOBIN: 10.5 g/dL — AB (ref 12.0–15.0)
Lymphocytes Relative: 23 % (ref 12–46)
Lymphs Abs: 1.4 10*3/uL (ref 0.7–4.0)
MCH: 27 pg (ref 26.0–34.0)
MCHC: 31.9 g/dL (ref 30.0–36.0)
MCV: 84.6 fL (ref 78.0–100.0)
MONOS PCT: 8 % (ref 3–12)
MPV: 10.6 fL (ref 8.6–12.4)
Monocytes Absolute: 0.5 10*3/uL (ref 0.1–1.0)
NEUTROS ABS: 4.1 10*3/uL (ref 1.7–7.7)
NEUTROS PCT: 69 % (ref 43–77)
Platelets: 214 10*3/uL (ref 150–400)
RBC: 3.89 MIL/uL (ref 3.87–5.11)
RDW: 14.6 % (ref 11.5–15.5)
WBC: 6 10*3/uL (ref 4.0–10.5)

## 2014-12-02 LAB — LIPID PANEL
CHOLESTEROL: 173 mg/dL (ref 0–200)
HDL: 80 mg/dL (ref >=46)
LDL Cholesterol: 79 mg/dL (ref 0–99)
TRIGLYCERIDES: 72 mg/dL (ref <150)
Total CHOL/HDL Ratio: 2.2 Ratio
VLDL: 14 mg/dL (ref 0–40)

## 2014-12-02 LAB — COMPREHENSIVE METABOLIC PANEL
ALK PHOS: 51 U/L (ref 39–117)
ALT: 13 U/L (ref 0–35)
AST: 16 U/L (ref 0–37)
Albumin: 4 g/dL (ref 3.5–5.2)
BILIRUBIN TOTAL: 0.5 mg/dL (ref 0.2–1.2)
BUN: 28 mg/dL — ABNORMAL HIGH (ref 6–23)
CO2: 26 meq/L (ref 19–32)
CREATININE: 1.41 mg/dL — AB (ref 0.50–1.10)
Calcium: 9 mg/dL (ref 8.4–10.5)
Chloride: 106 mEq/L (ref 96–112)
GLUCOSE: 117 mg/dL — AB (ref 70–99)
Potassium: 3.9 mEq/L (ref 3.5–5.3)
Sodium: 142 mEq/L (ref 135–145)
Total Protein: 6.5 g/dL (ref 6.0–8.3)

## 2014-12-02 LAB — HEMOGLOBIN A1C
HEMOGLOBIN A1C: 7.1 % — AB (ref <5.7)
Mean Plasma Glucose: 157 mg/dL — ABNORMAL HIGH (ref <117)

## 2014-12-02 NOTE — Assessment & Plan Note (Addendum)
Her blood sugar has been well controlled. I will recheck her labs today she will continue the glipizide Continue Crestor and her ACE inhibitor

## 2014-12-02 NOTE — Assessment & Plan Note (Signed)
Continue the Aricept for now will not add anything additional as she has been very stable.

## 2014-12-02 NOTE — Assessment & Plan Note (Addendum)
Very sporadic episodes of diarrhea we'll have them follow her diet is C if it is truly something that she is eating. She does not have any constipation no history of any fecal incontinence. I would not advise any antidiarrhea's at this time is it is very self-limited and very sporadic Her weight has also been stable which is a good sign

## 2014-12-02 NOTE — Progress Notes (Signed)
Patient ID: Betty [name], female   DOB: 1931-05-04, 81 y.o.   MRN: [id number]   Subjective:    Patient ID: Betty [name], female    DOB: 1931-03-27, 81 y.o.   MRN: [id number]  Patient presents for 4 month F/U  patient here to follow-up chronic medical problems. Her only concern is that she has had very sporadic episodes of diarrhea as she would typically only have one diarrheal stool at a time. This occurred in the past couple months she thinks it happens after she eats cheese burger the family has not noted any specific rhyme or reason to it. She is not to play have constipation in between. She has never taken any antidiarrhea medications because it is very self-limited when it does occur. She has no abdominal pain or nausea vomiting accompanied.  Diabetes mellitus her blood sugars have been good and do not have the meter with her today but she is taking the glipizide 5 mg once a day. She is due for an eye doctor appointment. She is also due for fasting labs today  She is doing well with her dementia medication  Review Of Systems:  GEN- denies fatigue, fever, weight loss,weakness, recent illness HEENT- denies eye drainage, change in vision, nasal discharge, CVS- denies chest pain, palpitations RESP- denies SOB, cough, wheeze ABD- denies N/V, +change in stools, abd pain GU- denies dysuria, hematuria, dribbling, incontinence MSK- denies joint pain, muscle aches, injury Neuro- denies headache, dizziness, syncope, seizure activity       Objective:    BP 130/68 mmHg  Pulse 64  Temp(Src) 97.4 F (36.3 C) (Oral)  Resp 14  Ht 5\' 2"  (1.575 m)  Wt 132 lb (59.875 kg)  BMI 24.14 kg/m2 GEN- NAD, alert and oriented x3, well appearing HEENT- PERRL, EOMI, non injected sclera, pink conjunctiva, MMM, oropharynx clear Neck- Supple, no thyromegaly CVS- RRR, 2/6 SEM RESP-CTAB ABD-NABS,soft,NT,ND EXT- No edema Pulses- Radial, DP- 2+        Assessment & Plan:      Problem  List Items Addressed This Visit      Unprioritized   Type II diabetes mellitus with renal manifestations   Relevant Orders   Hemoglobin A1c   Hyperlipidemia   Relevant Orders   Lipid panel   Essential hypertension, benign   Relevant Orders   CBC with Differential/Platelet   Comprehensive metabolic panel   CKD (chronic kidney disease), stage III - Primary      Note: This dictation was prepared with Dragon dictation along with smaller phrase technology. Any transcriptional errors that result from this process are unintentional.

## 2014-12-02 NOTE — Patient Instructions (Addendum)
Continue current medications We will call with lab results Call me about the diarrhea if this gets worse  Referral to eye doctor  F/U 4 months

## 2014-12-02 NOTE — Assessment & Plan Note (Signed)
Blood pressure is well-controlled the change in medication

## 2014-12-11 ENCOUNTER — Other Ambulatory Visit: Payer: Self-pay | Admitting: *Deleted

## 2014-12-11 ENCOUNTER — Telehealth: Payer: Self-pay | Admitting: Family Medicine

## 2014-12-11 NOTE — Telephone Encounter (Signed)
[phone]   PT is needing to speak to you about her test strips

## 2014-12-11 NOTE — Telephone Encounter (Signed)
Patient states that she received call stating she has order for test strips at [facility]. Reports that she gets her strips from [place].   Advised to call pharmacy to cancel order.

## 2015-01-14 LAB — HM DIABETES EYE EXAM

## 2015-01-19 ENCOUNTER — Other Ambulatory Visit: Payer: Self-pay | Admitting: *Deleted

## 2015-01-19 DIAGNOSIS — Z23 Encounter for immunization: Secondary | ICD-10-CM

## 2015-01-19 MED ORDER — GLIPIZIDE 5 MG PO TABS: 5.0000 mg | ORAL_TABLET | Freq: Two times a day (BID) | ORAL | Status: DC

## 2015-01-19 NOTE — Telephone Encounter (Signed)
Received fax requesting refill on glipizide.   Refill appropriate and filled per protocol.

## 2015-01-30 ENCOUNTER — Other Ambulatory Visit: Payer: Self-pay | Admitting: *Deleted

## 2015-01-30 MED ORDER — DONEPEZIL HCL 10 MG PO TABS: ORAL_TABLET | ORAL | Status: DC

## 2015-01-30 MED ORDER — METOPROLOL TARTRATE 25 MG PO TABS: ORAL_TABLET | ORAL | Status: DC

## 2015-01-30 NOTE — Telephone Encounter (Signed)
Received fax requesting refill on Aricept and Metoprolol.   Refill appropriate and filled per protocol.

## 2015-02-05 ENCOUNTER — Telehealth: Payer: Self-pay | Admitting: *Deleted

## 2015-02-05 NOTE — Telephone Encounter (Signed)
Received fax requesting refill on Xanax.   Ok to refill??  Last office visit 12/02/2014.  Last refill 07/04/2014, #3 refills.

## 2015-02-06 MED ORDER — ALPRAZOLAM 0.25 MG PO TABS: 0.2500 mg | ORAL_TABLET | Freq: Every evening | ORAL | Status: DC | PRN

## 2015-02-06 NOTE — Telephone Encounter (Signed)
Medication called to pharmacy. 

## 2015-02-06 NOTE — Telephone Encounter (Signed)
OKAY TO REFILL?

## 2015-04-03 ENCOUNTER — Ambulatory Visit: Payer: Medicare Other | Admitting: Family Medicine

## 2015-04-08 ENCOUNTER — Ambulatory Visit (INDEPENDENT_AMBULATORY_CARE_PROVIDER_SITE_OTHER): Payer: Medicare HMO | Admitting: Family Medicine

## 2015-04-08 ENCOUNTER — Encounter: Payer: Self-pay | Admitting: Family Medicine

## 2015-04-08 VITALS — BP 128/70 | HR 64 | Temp 98.5°F | Resp 14 | Ht 62.0 in | Wt 131.0 lb

## 2015-04-08 DIAGNOSIS — F0390 Unspecified dementia without behavioral disturbance: Secondary | ICD-10-CM | POA: Diagnosis not present

## 2015-04-08 DIAGNOSIS — N183 Chronic kidney disease, stage 3 unspecified: Secondary | ICD-10-CM

## 2015-04-08 DIAGNOSIS — F419 Anxiety disorder, unspecified: Secondary | ICD-10-CM | POA: Diagnosis not present

## 2015-04-08 DIAGNOSIS — E785 Hyperlipidemia, unspecified: Secondary | ICD-10-CM

## 2015-04-08 DIAGNOSIS — F5104 Psychophysiologic insomnia: Secondary | ICD-10-CM

## 2015-04-08 DIAGNOSIS — G4700 Insomnia, unspecified: Secondary | ICD-10-CM

## 2015-04-08 DIAGNOSIS — E1122 Type 2 diabetes mellitus with diabetic chronic kidney disease: Secondary | ICD-10-CM | POA: Diagnosis not present

## 2015-04-08 DIAGNOSIS — F03A0 Unspecified dementia, mild, without behavioral disturbance, psychotic disturbance, mood disturbance, and anxiety: Secondary | ICD-10-CM

## 2015-04-08 DIAGNOSIS — N189 Chronic kidney disease, unspecified: Secondary | ICD-10-CM

## 2015-04-08 DIAGNOSIS — I10 Essential (primary) hypertension: Secondary | ICD-10-CM

## 2015-04-08 LAB — CBC WITH DIFFERENTIAL/PLATELET
Basophils Absolute: 0 10*3/uL (ref 0.0–0.1)
Basophils Relative: 0 % (ref 0–1)
Eosinophils Absolute: 0 10*3/uL (ref 0.0–0.7)
Eosinophils Relative: 0 % (ref 0–5)
HEMATOCRIT: 32.6 % — AB (ref 36.0–46.0)
HEMOGLOBIN: 10.5 g/dL — AB (ref 12.0–15.0)
Lymphocytes Relative: 25 % (ref 12–46)
Lymphs Abs: 1.4 10*3/uL (ref 0.7–4.0)
MCH: 26.7 pg (ref 26.0–34.0)
MCHC: 32.2 g/dL (ref 30.0–36.0)
MCV: 83 fL (ref 78.0–100.0)
MONO ABS: 0.6 10*3/uL (ref 0.1–1.0)
MPV: 10.1 fL (ref 8.6–12.4)
Monocytes Relative: 10 % (ref 3–12)
NEUTROS ABS: 3.6 10*3/uL (ref 1.7–7.7)
Neutrophils Relative %: 65 % (ref 43–77)
PLATELETS: 198 10*3/uL (ref 150–400)
RBC: 3.93 MIL/uL (ref 3.87–5.11)
RDW: 15.2 % (ref 11.5–15.5)
WBC: 5.6 10*3/uL (ref 4.0–10.5)

## 2015-04-08 LAB — COMPREHENSIVE METABOLIC PANEL
ALT: 9 U/L (ref 0–35)
AST: 13 U/L (ref 0–37)
Albumin: 3.8 g/dL (ref 3.5–5.2)
Alkaline Phosphatase: 50 U/L (ref 39–117)
BILIRUBIN TOTAL: 0.4 mg/dL (ref 0.2–1.2)
BUN: 37 mg/dL — AB (ref 6–23)
CO2: 24 meq/L (ref 19–32)
CREATININE: 1.57 mg/dL — AB (ref 0.50–1.10)
Calcium: 9 mg/dL (ref 8.4–10.5)
Chloride: 107 mEq/L (ref 96–112)
GLUCOSE: 125 mg/dL — AB (ref 70–99)
Potassium: 4 mEq/L (ref 3.5–5.3)
Sodium: 145 mEq/L (ref 135–145)
Total Protein: 6.6 g/dL (ref 6.0–8.3)

## 2015-04-08 LAB — HEMOGLOBIN A1C
Hgb A1c MFr Bld: 7.8 % — ABNORMAL HIGH (ref <5.7)
MEAN PLASMA GLUCOSE: 177 mg/dL — AB (ref <117)

## 2015-04-08 NOTE — Patient Instructions (Addendum)
Continue current medications We will call with lab results F/U end of November for PHYSICAL

## 2015-04-08 NOTE — Assessment & Plan Note (Signed)
Currently stable.

## 2015-04-08 NOTE — Assessment & Plan Note (Signed)
Recheck A1C well controlled, good appeite, on ACE, Crestor

## 2015-04-08 NOTE — Assessment & Plan Note (Signed)
Xanax for anxiety and insomnia, at bedtime

## 2015-04-08 NOTE — Assessment & Plan Note (Signed)
Well controlled, no change to meds  Note likley had inflammed varicose vein, now resolved

## 2015-04-08 NOTE — Progress Notes (Signed)
Patient ID: Betty [name], female   DOB: [birth year], 81 y.o.   MRN: [id number]   Subjective:    Patient ID: Betty [name], female    DOB: 09-07-1931, 81 y.o.   MRN: [id number]  Patient presents for 4 month F/U  Pt here to f/u chronic medical problems. No concerns today, states she feels well, has no checked blood sugars but taking glipizide 5mg  once a day , Bowels are back to baseline BP at home 120-130's/ 60-70's, no hypotensive episodes She did have a sore spot on right leg , now resolved   Review Of Systems:  GEN- denies fatigue, fever, weight loss,weakness, recent illness HEENT- denies eye drainage, change in vision, nasal discharge, CVS- denies chest pain, palpitations RESP- denies SOB, cough, wheeze ABD- denies N/V, change in stools, abd pain GU- denies dysuria, hematuria, dribbling, incontinence MSK- denies joint pain, muscle aches, injury Neuro- denies headache, dizziness, syncope, seizure activity       Objective:    BP 128/70 mmHg  Pulse 64  Temp(Src) 98.5 F (36.9 C) (Oral)  Resp 14  Ht 5\' 2"  (1.575 m)  Wt 131 lb (59.421 kg)  BMI 23.95 kg/m2 GEN- NAD, alert and oriented x3 HEENT- PERRL, EOMI, non injected sclera, pink conjunctiva, MMM, oropharynx clear Neck- Supple, no thyromegaly, no bruit  CVS- RRR, 2/6 SEM  RESP-CTAB ABD-NABS,soft,NT,ND EXT- No edema, Right lateral leg- below knee- varicose vein, NT, no cords palpated  Pulses- Radial, DP- 2+        Assessment & Plan:      Problem List Items Addressed This Visit    None      Note: This dictation was prepared with Dragon dictation along with smaller phrase technology. Any transcriptional errors that result from this process are unintentional.

## 2015-04-08 NOTE — Assessment & Plan Note (Signed)
Lipids at goal on crestor

## 2015-04-10 ENCOUNTER — Other Ambulatory Visit: Payer: Self-pay | Admitting: *Deleted

## 2015-04-10 DIAGNOSIS — Z23 Encounter for immunization: Secondary | ICD-10-CM

## 2015-04-10 MED ORDER — GLIPIZIDE 5 MG PO TABS: ORAL_TABLET | ORAL | Status: DC

## 2015-06-02 ENCOUNTER — Other Ambulatory Visit: Payer: Self-pay | Admitting: Family Medicine

## 2015-06-02 MED ORDER — ROSUVASTATIN CALCIUM 20 MG PO TABS: 20.0000 mg | ORAL_TABLET | Freq: Every day | ORAL | Status: DC

## 2015-06-02 NOTE — Telephone Encounter (Signed)
Medication refilled per protocol. 

## 2015-09-02 ENCOUNTER — Encounter: Payer: Self-pay | Admitting: *Deleted

## 2015-09-02 ENCOUNTER — Other Ambulatory Visit: Payer: Self-pay | Admitting: *Deleted

## 2015-09-02 MED ORDER — DONEPEZIL HCL 10 MG PO TABS: ORAL_TABLET | ORAL | Status: DC

## 2015-09-02 MED ORDER — METOPROLOL TARTRATE 25 MG PO TABS: ORAL_TABLET | ORAL | Status: DC

## 2015-09-02 NOTE — Telephone Encounter (Signed)
Received fax requesting refill on Aricept and Metoprolol.   Refill appropriate and filled per protocol.

## 2015-09-04 ENCOUNTER — Telehealth: Payer: Self-pay | Admitting: *Deleted

## 2015-09-04 ENCOUNTER — Ambulatory Visit (INDEPENDENT_AMBULATORY_CARE_PROVIDER_SITE_OTHER): Payer: Medicare HMO | Admitting: Family Medicine

## 2015-09-04 ENCOUNTER — Encounter: Payer: Self-pay | Admitting: Family Medicine

## 2015-09-04 VITALS — BP 148/62 | HR 82 | Temp 98.2°F | Resp 14 | Ht 62.0 in | Wt 132.0 lb

## 2015-09-04 DIAGNOSIS — I10 Essential (primary) hypertension: Secondary | ICD-10-CM

## 2015-09-04 DIAGNOSIS — E1122 Type 2 diabetes mellitus with diabetic chronic kidney disease: Secondary | ICD-10-CM | POA: Diagnosis not present

## 2015-09-04 DIAGNOSIS — R69 Illness, unspecified: Secondary | ICD-10-CM | POA: Diagnosis not present

## 2015-09-04 DIAGNOSIS — E785 Hyperlipidemia, unspecified: Secondary | ICD-10-CM | POA: Diagnosis not present

## 2015-09-04 DIAGNOSIS — Z0000 Encounter for general adult medical examination without abnormal findings: Secondary | ICD-10-CM

## 2015-09-04 DIAGNOSIS — N183 Chronic kidney disease, stage 3 unspecified: Secondary | ICD-10-CM

## 2015-09-04 DIAGNOSIS — E1129 Type 2 diabetes mellitus with other diabetic kidney complication: Secondary | ICD-10-CM | POA: Diagnosis not present

## 2015-09-04 DIAGNOSIS — F419 Anxiety disorder, unspecified: Secondary | ICD-10-CM

## 2015-09-04 DIAGNOSIS — Z23 Encounter for immunization: Secondary | ICD-10-CM

## 2015-09-04 LAB — CBC WITH DIFFERENTIAL/PLATELET
BASOS ABS: 0 10*3/uL (ref 0.0–0.1)
Basophils Relative: 0 % (ref 0–1)
EOS ABS: 0 10*3/uL (ref 0.0–0.7)
EOS PCT: 0 % (ref 0–5)
HCT: 31.4 % — ABNORMAL LOW (ref 36.0–46.0)
Hemoglobin: 10.4 g/dL — ABNORMAL LOW (ref 12.0–15.0)
LYMPHS ABS: 2 10*3/uL (ref 0.7–4.0)
Lymphocytes Relative: 29 % (ref 12–46)
MCH: 27.1 pg (ref 26.0–34.0)
MCHC: 33.1 g/dL (ref 30.0–36.0)
MCV: 81.8 fL (ref 78.0–100.0)
MONOS PCT: 9 % (ref 3–12)
MPV: 10 fL (ref 8.6–12.4)
Monocytes Absolute: 0.6 10*3/uL (ref 0.1–1.0)
Neutro Abs: 4.3 10*3/uL (ref 1.7–7.7)
Neutrophils Relative %: 62 % (ref 43–77)
PLATELETS: 211 10*3/uL (ref 150–400)
RBC: 3.84 MIL/uL — ABNORMAL LOW (ref 3.87–5.11)
RDW: 14.9 % (ref 11.5–15.5)
WBC: 6.9 10*3/uL (ref 4.0–10.5)

## 2015-09-04 LAB — COMPREHENSIVE METABOLIC PANEL
ALT: 14 U/L (ref 6–29)
AST: 17 U/L (ref 10–35)
Albumin: 4 g/dL (ref 3.6–5.1)
Alkaline Phosphatase: 50 U/L (ref 33–130)
BUN: 30 mg/dL — AB (ref 7–25)
CHLORIDE: 106 mmol/L (ref 98–110)
CO2: 25 mmol/L (ref 20–31)
Calcium: 8.9 mg/dL (ref 8.6–10.4)
Creat: 1.38 mg/dL — ABNORMAL HIGH (ref 0.60–0.88)
GLUCOSE: 178 mg/dL — AB (ref 70–99)
POTASSIUM: 3.8 mmol/L (ref 3.5–5.3)
Sodium: 141 mmol/L (ref 135–146)
Total Bilirubin: 0.4 mg/dL (ref 0.2–1.2)
Total Protein: 6.6 g/dL (ref 6.1–8.1)

## 2015-09-04 LAB — LIPID PANEL
Cholesterol: 180 mg/dL (ref 125–200)
HDL: 64 mg/dL (ref >=46)
LDL CALC: 96 mg/dL (ref <130)
TRIGLYCERIDES: 102 mg/dL (ref <150)
Total CHOL/HDL Ratio: 2.8 Ratio (ref <=5.0)
VLDL: 20 mg/dL (ref <30)

## 2015-09-04 LAB — HEMOGLOBIN A1C
Hgb A1c MFr Bld: 7.8 % — ABNORMAL HIGH (ref <5.7)
Mean Plasma Glucose: 177 mg/dL — ABNORMAL HIGH (ref <117)

## 2015-09-04 MED ORDER — ALPRAZOLAM 0.25 MG PO TABS: 0.2500 mg | ORAL_TABLET | Freq: Every evening | ORAL | Status: DC | PRN

## 2015-09-04 NOTE — Patient Instructions (Signed)
Continue current medications We will call with labs Flu shot given F/U 4 months

## 2015-09-04 NOTE — Progress Notes (Signed)
Patient ID: Betty [name], female   DOB: Dec 16, 1930, 81 y.o.   MRN: [id number] Subjective:   Patient presents for Medicare Annual/Subsequent preventive examination.   DM-last A1C 7.8%, taking Glipizide 7.5mg  daily- CBG run 70110 fasting BP- 110-130/ 48-68  No hypoglycemia, no polyuria   Cholesterol has been at goal    Doing well at home, no falls. Children asist her with shopping but she still cooksd oes housework, .she is still quite dependent in many ways.   Review Past Medical/Family/Social: Per EMR   Risk Factors  Current exercise habits: walks Dietary issues discussed: Yes  Cardiac risk factors: HTN, DM, hyperlipidemia  Depression Screen  (Note: if answer to either of the following is "Yes", a more complete depression screening is indicated)  Over the past two weeks, have you felt down, depressed or hopeless? No Over the past two weeks, have you felt little interest or pleasure in doing things? No Have you lost interest or pleasure in daily life? No Do you often feel hopeless? No Do you cry easily over simple problems? No   Activities of Daily Living  In your present state of health, do you have any difficulty performing the following activities?:  Driving? No  Managing money? No  Feeding yourself? No  Getting from bed to chair? No  Climbing a flight of stairs? No  Preparing food and eating?: No  Bathing or showering? No  Getting dressed: No  Getting to the toilet? No  Using the toilet:No  Moving around from place to place: No  In the past year have you fallen or had a near fall?:No  Are you sexually active? No  Do you have more than one partner? No   Hearing Difficulties: No  Do you often ask people to speak up or repeat themselves? No  Do you experience ringing or noises in your ears? No Do you have difficulty understanding soft or whispered voices? No  Do you feel that you have a problem with memory? No Do you often misplace items? No  Do you feel safe  at home? Yes  Cognitive Testing  - Mild dementia- on Aricept  Alert? Yes Normal Appearance?Yes  Oriented to person? Yes Place? Yes  Time? Yes  Recall of three objects? Yes  Can perform simple calculations? Somertimes  Displays appropriate judgment?Yes  Can read the correct time from a watch face?Yes   List the Names of Other Physician/Practitioners you currently use:  [place] [facility]  Screening Tests / Date Colonoscopy -    Not needed with age               Zostavax - UTD Mammogram - over age Influenza Vaccine - Due  Tetanus/tdap - unable due to finances  ROS: GEN- denies fatigue, fever, weight loss,weakness, recent illness HEENT- denies eye drainage, change in vision, nasal discharge, CVS- denies chest pain, palpitations RESP- denies SOB, cough, wheeze ABD- denies N/V, change in stools, abd pain GU- denies dysuria, hematuria, dribbling, incontinence MSK- denies joint pain, muscle aches, injury Neuro- denies headache, dizziness, syncope, seizure activity  Physical: GEN- NAD, alert and oriented x3 HEENT- PERRL, EOMI, non injected sclera, pink conjunctiva, MMM, oropharynx clear Neck- Supple, no thyromegaly, no bruit  CVS- RRR, 2/6 SEM  RESP-CTAB ABD-NABS,soft,NT,ND EXT- No edema, Varicose veins  Pulses- Radial, DP- 2+    Assessment:    Annual wellness medicare exam   Plan:    During the course of the visit the patient was educated and counseled about appropriate  screening and preventive services including:  Flu shot given  Fasting labs Screen Neg for depression. Family has living will in order/ MOST FORM Reviewed and given to  family, they will discuss Diet review for nutrition referral? Yes ____ Not Indicated __x__  Patient Instructions (the written plan) was given to the patient.  Medicare Attestation  I have personally reviewed:  The patient's medical and social history  Their use of alcohol, tobacco or illicit drugs  Their current medications and  supplements  The patient's functional ability including ADLs,fall risks, home safety risks, cognitive, and hearing and visual impairment  Diet and physical activities  Evidence for depression or mood disorders  The patient's weight, height, BMI, and visual acuity have been recorded in the chart. I have made referrals, counseling, and provided education to the patient based on review of the above and I have provided the patient with a written personalized care plan for preventive services.

## 2015-09-04 NOTE — Telephone Encounter (Signed)
okay

## 2015-09-04 NOTE — Telephone Encounter (Signed)
Received fax requesting refill on Xanax.   Ok to refill??  Last office visit 09/04/2015.  Last refill 02/06/2015, #3 refills.

## 2015-09-05 LAB — MICROALBUMIN / CREATININE URINE RATIO
CREATININE, URINE: 267 mg/dL (ref 20–320)
MICROALB UR: 2.8 mg/dL
Microalb Creat Ratio: 10 mcg/mg creat (ref <30)

## 2015-09-07 ENCOUNTER — Encounter: Payer: Self-pay | Admitting: *Deleted

## 2015-09-29 ENCOUNTER — Other Ambulatory Visit: Payer: Self-pay | Admitting: *Deleted

## 2015-09-29 DIAGNOSIS — Z23 Encounter for immunization: Secondary | ICD-10-CM

## 2015-09-29 MED ORDER — GLIPIZIDE 5 MG PO TABS: ORAL_TABLET | ORAL | Status: DC

## 2015-09-29 NOTE — Telephone Encounter (Signed)
Received fax requesting refill on Glipizide.   Refill appropriate and filled per protocol.

## 2015-10-10 ENCOUNTER — Emergency Department (HOSPITAL_COMMUNITY): Payer: Medicare HMO

## 2015-10-10 ENCOUNTER — Emergency Department (HOSPITAL_COMMUNITY)
Admission: EM | Admit: 2015-10-10 | Discharge: 2015-10-10 | Disposition: A | Discharge status: Home / Self Care | Payer: Medicare HMO | Attending: Emergency Medicine | Admitting: Emergency Medicine

## 2015-10-10 ENCOUNTER — Encounter (HOSPITAL_COMMUNITY): Payer: Self-pay | Admitting: Emergency Medicine

## 2015-10-10 DIAGNOSIS — R55 Syncope and collapse: Secondary | ICD-10-CM | POA: Diagnosis not present

## 2015-10-10 DIAGNOSIS — E119 Type 2 diabetes mellitus without complications: Secondary | ICD-10-CM | POA: Diagnosis not present

## 2015-10-10 DIAGNOSIS — N390 Urinary tract infection, site not specified: Secondary | ICD-10-CM | POA: Diagnosis not present

## 2015-10-10 DIAGNOSIS — N182 Chronic kidney disease, stage 2 (mild): Secondary | ICD-10-CM | POA: Insufficient documentation

## 2015-10-10 DIAGNOSIS — Z7984 Long term (current) use of oral hypoglycemic drugs: Secondary | ICD-10-CM | POA: Diagnosis not present

## 2015-10-10 DIAGNOSIS — R079 Chest pain, unspecified: Secondary | ICD-10-CM | POA: Diagnosis not present

## 2015-10-10 DIAGNOSIS — E785 Hyperlipidemia, unspecified: Secondary | ICD-10-CM | POA: Insufficient documentation

## 2015-10-10 DIAGNOSIS — F0390 Unspecified dementia without behavioral disturbance: Secondary | ICD-10-CM | POA: Insufficient documentation

## 2015-10-10 DIAGNOSIS — I129 Hypertensive chronic kidney disease with stage 1 through stage 4 chronic kidney disease, or unspecified chronic kidney disease: Secondary | ICD-10-CM | POA: Diagnosis not present

## 2015-10-10 DIAGNOSIS — R69 Illness, unspecified: Secondary | ICD-10-CM | POA: Diagnosis not present

## 2015-10-10 DIAGNOSIS — R112 Nausea with vomiting, unspecified: Secondary | ICD-10-CM | POA: Insufficient documentation

## 2015-10-10 LAB — BASIC METABOLIC PANEL
Anion gap: 10 (ref 5–15)
BUN: 27 mg/dL — ABNORMAL HIGH (ref 6–20)
CALCIUM: 9.1 mg/dL (ref 8.9–10.3)
CHLORIDE: 105 mmol/L (ref 101–111)
CO2: 27 mmol/L (ref 22–32)
CREATININE: 1.52 mg/dL — AB (ref 0.44–1.00)
GFR calc non Af Amer: 31 mL/min — ABNORMAL LOW (ref >60)
GFR, EST AFRICAN AMERICAN: 36 mL/min — AB (ref >60)
GLUCOSE: 193 mg/dL — AB (ref 65–99)
Potassium: 3.9 mmol/L (ref 3.5–5.1)
Sodium: 142 mmol/L (ref 135–145)

## 2015-10-10 LAB — CBC WITH DIFFERENTIAL/PLATELET
Basophils Absolute: 0 10*3/uL (ref 0.0–0.1)
Basophils Relative: 0 %
EOS ABS: 0 10*3/uL (ref 0.0–0.7)
Eosinophils Relative: 0 %
HEMATOCRIT: 32.6 % — AB (ref 36.0–46.0)
HEMOGLOBIN: 10.6 g/dL — AB (ref 12.0–15.0)
LYMPHS ABS: 2.8 10*3/uL (ref 0.7–4.0)
Lymphocytes Relative: 34 %
MCH: 27.3 pg (ref 26.0–34.0)
MCHC: 32.5 g/dL (ref 30.0–36.0)
MCV: 84 fL (ref 78.0–100.0)
MONO ABS: 0.6 10*3/uL (ref 0.1–1.0)
MONOS PCT: 7 %
NEUTROS ABS: 4.8 10*3/uL (ref 1.7–7.7)
NEUTROS PCT: 59 %
Platelets: 197 10*3/uL (ref 150–400)
RBC: 3.88 MIL/uL (ref 3.87–5.11)
RDW: 13.7 % (ref 11.5–15.5)
WBC: 8.2 10*3/uL (ref 4.0–10.5)

## 2015-10-10 LAB — URINE MICROSCOPIC-ADD ON

## 2015-10-10 LAB — URINALYSIS, ROUTINE W REFLEX MICROSCOPIC
BILIRUBIN URINE: NEGATIVE
Glucose, UA: NEGATIVE mg/dL
Nitrite: NEGATIVE
PH: 6 (ref 5.0–8.0)
Protein, ur: NEGATIVE mg/dL
SPECIFIC GRAVITY, URINE: 1.025 (ref 1.005–1.030)

## 2015-10-10 MED ORDER — SODIUM CHLORIDE 0.9 % IV BOLUS (SEPSIS)
500.0000 mL | Freq: Once | INTRAVENOUS | Status: AC
  Administered 2015-10-10: 500 mL via INTRAVENOUS

## 2015-10-10 MED ORDER — CEPHALEXIN 500 MG PO CAPS: 500.0000 mg | ORAL_CAPSULE | Freq: Four times a day (QID) | ORAL | Status: DC

## 2015-10-10 NOTE — ED Notes (Signed)
MD at bedside. 

## 2015-10-10 NOTE — ED Provider Notes (Signed)
CSN: [id number]     Arrival date & time 10/10/15  0920 History   First MD Initiated Contact with Patient 10/10/15 0932     Chief Complaint  Patient presents with  . Loss of Consciousness     (Consider location/radiation/quality/duration/timing/severity/associated sxs/prior Treatment) HPI Comments: Patient is an 82 year old female with history of diabetes, hypertension, and dementia. She presents for evaluation of syncope. She was cooking in the kitchen this morning with her daughter when she informed her daughter that she was feeling "hot". She then stepped away from the stove and leaned up against the doorway when she fell to the floor and briefly lost consciousness. The daughter was able to rouse her and wake her after several seconds. Shortly after coming to, she had one episode of vomiting. She now feels fine and has no complaints. She denies previous syncopal episodes, but has reported feeling "hot" intermittently for some time.  Patient is a 83 y.o. female presenting with syncope. The history is provided by the patient.  Loss of Consciousness Episode history:  Single Most recent episode:  Today Duration:  30 seconds Timing:  Constant Progression:  Resolved Chronicity:  New Context: standing up   Witnessed: yes   Relieved by:  Nothing Worsened by:  Nothing tried Ineffective treatments:  None tried Associated symptoms: vomiting     Past Medical History  Diagnosis Date  . Diabetes mellitus   . Hypertension   . Hyperlipidemia   . Dementia   . Chronic kidney disease     stage II   Past Surgical History  Procedure Laterality Date  . Abdominal hysterectomy    . Back surgery     Family History  Problem Relation Age of Onset  . Hypertension Mother   . Cancer Father     colon   . Cancer Sister     Breast cancer  . Osteoporosis Sister    Social History  Substance Use Topics  . Smoking status: Never Smoker   . Smokeless tobacco: Never Used  . Alcohol Use: No   OB  History    No data available     Review of Systems  Cardiovascular: Positive for syncope.  Gastrointestinal: Positive for vomiting.  All other systems reviewed and are negative.     Allergies  Review of patient's allergies indicates no known allergies.  Home Medications   Prior to Admission medications   Medication Sig Start Date End Date Taking? Authorizing Provider  ALPRAZolam (XANAX) 0.25 MG tablet Take 1 tablet (0.25 mg total) by mouth at bedtime as needed for anxiety or sleep. 09/04/15   Alycia Rossetti, MD  donepezil (ARICEPT) 10 MG tablet take 1 tablet by mouth at bedtime for memory 09/02/15   Alycia Rossetti, MD  glipiZIDE (GLUCOTROL) 5 MG tablet Take (1) tablet by mouth before breakfast and take (1/2) tablet by mouth before dinner. 09/29/15   Alycia Rossetti, MD  glucose blood test strip Use as instructed for once daily testing  With Easymax meter 12/31/12   Alycia Rossetti, MD  losartan-hydrochlorothiazide [facility]) 100-25 MG per tablet TAKE 1 TABLET FOR BLOOD PRESSURE IN MORNING 07/04/14   Alycia Rossetti, MD  metoprolol tartrate (LOPRESSOR) 25 MG tablet take 1 tablet by mouth twice a day with meals  FOR BLOOD PRESSURE 09/02/15   Alycia Rossetti, MD  rosuvastatin (CRESTOR) 20 MG tablet Take 1 tablet (20 mg total) by mouth at bedtime. FOR CHOLESTEROL 06/02/15   Alycia Rossetti, MD   BP  161/58 mmHg  Pulse 78  Temp(Src) 97.9 F (36.6 C) (Oral)  Resp 16  Ht 5\' 4"  (1.626 m)  Wt 135 lb (61.236 kg)  BMI 23.16 kg/m2  SpO2 100% Physical Exam  Constitutional: She is oriented to person, place, and time. She appears well-developed and well-nourished. No distress.  HENT:  Head: Normocephalic and atraumatic.  Neck: Normal range of motion. Neck supple.  Cardiovascular: Normal rate and regular rhythm.  Exam reveals no gallop and no friction rub.   No murmur heard. Pulmonary/Chest: Effort normal and breath sounds normal. No respiratory distress. She has no wheezes.   Abdominal: Soft. Bowel sounds are normal. She exhibits no distension. There is no tenderness.  Musculoskeletal: Normal range of motion.  Neurological: She is alert and oriented to person, place, and time.  Skin: Skin is warm and dry. She is not diaphoretic.  Nursing note and vitals reviewed.   ED Course  Procedures (including critical care time) Labs Review Labs Reviewed  BASIC METABOLIC PANEL  CBC WITH DIFFERENTIAL/PLATELET    Imaging Review No results found. I have personally reviewed and evaluated these images and lab results as part of my medical decision-making.    MDM   Final diagnoses:  None    Patient is an 82 year old female who presents after a syncopal episode. She has no complaints and tells me that she feels fine. This episode was witnessed by the daughter who is present at bedside. She describes a several second loss of consciousness after she reported feeling hot. She vomited shortly after coming to. Her workup so far reveals no obvious abnormality. Her CBC and metabolic profile are consistent with baseline. She has a normal EKG and has remained in a normal sinus rhythm throughout her emergency department stay. A CT scan of the head fails to reveal any evidence of a cause of her fall or injury related to her fall.  A urinalysis was obtained at the request of the daughter although the patient is not symptomatic. Her urine reveals many bacteria, moderate leukocytes, however is nitrite negative. Due to her history of UTIs, I will treat this with Keflex. She is to follow-up as needed if symptoms worsen or change. If she continues to have these episodes, she may need further investigation, possibly a Holter monitor.  Veryl Speak, MD 10/10/15 [phone]

## 2015-10-10 NOTE — ED Notes (Signed)
Patient arrives POV with c/o LOC at home today. Per daughter, c/o being hot and then hit the floor. Daughter reports that she was diaphoretic and c/o nausea and vomited x 1. LOC last about 1 minute. Denies pain. Alert/oriented x 4.

## 2015-10-10 NOTE — Discharge Instructions (Signed)
Keflex as prescribed.  Follow-up with your primary Dr. if your hot flash episodes persist.   Syncope Syncope is a medical term for fainting or passing out. This means you lose consciousness and drop to the ground. People are generally unconscious for less than 5 minutes. You may have some muscle twitches for up to 15 seconds before waking up and returning to normal. Syncope occurs more often in older adults, but it can happen to anyone. While most causes of syncope are not dangerous, syncope can be a sign of a serious medical problem. It is important to seek medical care.  CAUSES  Syncope is caused by a sudden drop in blood flow to the brain. The specific cause is often not determined. Factors that can bring on syncope include:  Taking medicines that lower blood pressure.  Sudden changes in posture, such as standing up quickly.  Taking more medicine than prescribed.  Standing in one place for too long.  Seizure disorders.  Dehydration and excessive exposure to heat.  Low blood sugar (hypoglycemia).  Straining to have a bowel movement.  Heart disease, irregular heartbeat, or other circulatory problems.  Fear, emotional distress, seeing blood, or severe pain. SYMPTOMS  Right before fainting, you may:  Feel dizzy or light-headed.  Feel nauseous.  See all white or all black in your field of vision.  Have cold, clammy skin. DIAGNOSIS  Your health care provider will ask about your symptoms, perform a physical exam, and perform an electrocardiogram (ECG) to record the electrical activity of your heart. Your health care provider may also perform other heart or blood tests to determine the cause of your syncope which may include:  Transthoracic echocardiogram (TTE). During echocardiography, sound waves are used to evaluate how blood flows through your heart.  Transesophageal echocardiogram (TEE).  Cardiac monitoring. This allows your health care provider to monitor your heart  rate and rhythm in real time.  Holter monitor. This is a portable device that records your heartbeat and can help diagnose heart arrhythmias. It allows your health care provider to track your heart activity for several days, if needed.  Stress tests by exercise or by giving medicine that makes the heart beat faster. TREATMENT  In most cases, no treatment is needed. Depending on the cause of your syncope, your health care provider may recommend changing or stopping some of your medicines. HOME CARE INSTRUCTIONS  Have someone stay with you until you feel stable.  Do not drive, use machinery, or play sports until your health care provider says it is okay.  Keep all follow-up appointments as directed by your health care provider.  Lie down right away if you start feeling like you might faint. Breathe deeply and steadily. Wait until all the symptoms have passed.  Drink enough fluids to keep your urine clear or pale yellow.  If you are taking blood pressure or heart medicine, get up slowly and take several minutes to sit and then stand. This can reduce dizziness. SEEK IMMEDIATE MEDICAL CARE IF:   You have a severe headache.  You have unusual pain in the chest, abdomen, or back.  You are bleeding from your mouth or rectum, or you have black or tarry stool.  You have an irregular or very fast heartbeat.  You have pain with breathing.  You have repeated fainting or seizure-like jerking during an episode.  You faint when sitting or lying down.  You have confusion.  You have trouble walking.  You have severe weakness.  You  have vision problems. If you fainted, call your local emergency services (911 in [place] Do not drive yourself to the hospital.    This information is not intended to replace advice given to you by your health care provider. Make sure you discuss any questions you have with your health care provider.   Document Released: 09/19/2005 Document Revised: 02/03/2015  Document Reviewed: 11/18/2011 Elsevier Interactive Patient Education 2016 Else[employer]  Urinary Tract Infection Urinary tract infections (UTIs) can develop anywhere along your urinary tract. Your urinary tract is your body's drainage system for removing wastes and extra water. Your urinary tract includes two kidneys, two ureters, a bladder, and a urethra. Your kidneys are a pair of bean-shaped organs. Each kidney is about the size of your fist. They are located below your ribs, one on each side of your spine. CAUSES Infections are caused by microbes, which are microscopic organisms, including fungi, viruses, and bacteria. These organisms are so small that they can only be seen through a microscope. Bacteria are the microbes that most commonly cause UTIs. SYMPTOMS  Symptoms of UTIs may vary by age and gender of the patient and by the location of the infection. Symptoms in young women typically include a frequent and intense urge to urinate and a painful, burning feeling in the bladder or urethra during urination. Older women and men are more likely to be tired, shaky, and weak and have muscle aches and abdominal pain. A fever may mean the infection is in your kidneys. Other symptoms of a kidney infection include pain in your back or sides below the ribs, nausea, and vomiting. DIAGNOSIS To diagnose a UTI, your caregiver will ask you about your symptoms. Your caregiver will also ask you to provide a urine sample. The urine sample will be tested for bacteria and white blood cells. White blood cells are made by your body to help fight infection. TREATMENT  Typically, UTIs can be treated with medication. Because most UTIs are caused by a bacterial infection, they usually can be treated with the use of antibiotics. The choice of antibiotic and length of treatment depend on your symptoms and the type of bacteria causing your infection. HOME CARE INSTRUCTIONS  If you were prescribed antibiotics, take them  exactly as your caregiver instructs you. Finish the medication even if you feel better after you have only taken some of the medication.  Drink enough water and fluids to keep your urine clear or pale yellow.  Avoid caffeine, tea, and carbonated beverages. They tend to irritate your bladder.  Empty your bladder often. Avoid holding urine for long periods of time.  Empty your bladder before and after sexual intercourse.  After a bowel movement, women should cleanse from front to back. Use each tissue only once. SEEK MEDICAL CARE IF:   You have back pain.  You develop a fever.  Your symptoms do not begin to resolve within 3 days. SEEK IMMEDIATE MEDICAL CARE IF:   You have severe back pain or lower abdominal pain.  You develop chills.  You have nausea or vomiting.  You have continued burning or discomfort with urination. MAKE SURE YOU:   Understand these instructions.  Will watch your condition.  Will get help right away if you are not doing well or get worse.   This information is not intended to replace advice given to you by your health care provider. Make sure you discuss any questions you have with your health care provider.   Document Released: 06/29/2005 Document  Revised: 06/10/2015 Document Reviewed: 10/28/2011 Elsevier Interactive Patient Education [employer].

## 2015-12-15 ENCOUNTER — Other Ambulatory Visit: Payer: Self-pay | Admitting: *Deleted

## 2015-12-15 MED ORDER — ROSUVASTATIN CALCIUM 20 MG PO TABS: 20.0000 mg | ORAL_TABLET | Freq: Every day | ORAL | Status: DC

## 2015-12-15 NOTE — Telephone Encounter (Signed)
Received fax requesting refill on Crestor.   Refill appropriate and filled per protocol.

## 2016-01-04 ENCOUNTER — Ambulatory Visit (INDEPENDENT_AMBULATORY_CARE_PROVIDER_SITE_OTHER): Payer: Medicare HMO | Admitting: Family Medicine

## 2016-01-04 ENCOUNTER — Encounter: Payer: Self-pay | Admitting: Family Medicine

## 2016-01-04 VITALS — BP 140/80 | HR 78 | Temp 98.5°F | Resp 12 | Ht 64.0 in | Wt 125.0 lb

## 2016-01-04 DIAGNOSIS — E1122 Type 2 diabetes mellitus with diabetic chronic kidney disease: Secondary | ICD-10-CM

## 2016-01-04 DIAGNOSIS — R69 Illness, unspecified: Secondary | ICD-10-CM | POA: Diagnosis not present

## 2016-01-04 DIAGNOSIS — I10 Essential (primary) hypertension: Secondary | ICD-10-CM

## 2016-01-04 DIAGNOSIS — Z23 Encounter for immunization: Secondary | ICD-10-CM

## 2016-01-04 DIAGNOSIS — E1129 Type 2 diabetes mellitus with other diabetic kidney complication: Secondary | ICD-10-CM | POA: Diagnosis not present

## 2016-01-04 DIAGNOSIS — N183 Chronic kidney disease, stage 3 unspecified: Secondary | ICD-10-CM

## 2016-01-04 DIAGNOSIS — F03A0 Unspecified dementia, mild, without behavioral disturbance, psychotic disturbance, mood disturbance, and anxiety: Secondary | ICD-10-CM

## 2016-01-04 DIAGNOSIS — F0390 Unspecified dementia without behavioral disturbance: Secondary | ICD-10-CM | POA: Diagnosis not present

## 2016-01-04 LAB — COMPLETE METABOLIC PANEL WITH GFR
ALBUMIN: 3.9 g/dL (ref 3.6–5.1)
ALK PHOS: 58 U/L (ref 33–130)
ALT: 10 U/L (ref 6–29)
AST: 17 U/L (ref 10–35)
BILIRUBIN TOTAL: 0.4 mg/dL (ref 0.2–1.2)
BUN: 30 mg/dL — AB (ref 7–25)
CALCIUM: 9.2 mg/dL (ref 8.6–10.4)
CO2: 26 mmol/L (ref 20–31)
CREATININE: 1.37 mg/dL — AB (ref 0.60–0.88)
Chloride: 104 mmol/L (ref 98–110)
GFR, Est African American: 41 mL/min — ABNORMAL LOW (ref >=60)
GFR, Est Non African American: 36 mL/min — ABNORMAL LOW (ref >=60)
GLUCOSE: 146 mg/dL — AB (ref 70–99)
POTASSIUM: 3.8 mmol/L (ref 3.5–5.3)
Sodium: 142 mmol/L (ref 135–146)
TOTAL PROTEIN: 6.5 g/dL (ref 6.1–8.1)

## 2016-01-04 LAB — HEMOGLOBIN A1C
HEMOGLOBIN A1C: 8.3 % — AB (ref <5.7)
Mean Plasma Glucose: 192 mg/dL

## 2016-01-04 MED ORDER — GLIPIZIDE 5 MG PO TABS: ORAL_TABLET | ORAL | Status: DC

## 2016-01-04 MED ORDER — METOPROLOL TARTRATE 25 MG PO TABS: ORAL_TABLET | ORAL | Status: DC

## 2016-01-04 MED ORDER — ROSUVASTATIN CALCIUM 20 MG PO TABS: 20.0000 mg | ORAL_TABLET | Freq: Every day | ORAL | Status: DC

## 2016-01-04 MED ORDER — DONEPEZIL HCL 10 MG PO TABS: ORAL_TABLET | ORAL | Status: DC

## 2016-01-04 MED ORDER — LOSARTAN POTASSIUM-HCTZ 100-25 MG PO TABS: ORAL_TABLET | ORAL | Status: DC

## 2016-01-04 MED ORDER — ALPRAZOLAM 0.25 MG PO TABS: 0.2500 mg | ORAL_TABLET | Freq: Every evening | ORAL | Status: DC | PRN

## 2016-01-04 NOTE — Progress Notes (Signed)
Patient ID: Betty [name], female   DOB: 04-25-1931, 82 y.o.   MRN: [id number]    Subjective:    Patient ID: Betty [name], female    DOB: 11/17/1930, 82 y.o.   MRN: [id number]  Patient presents for 4 month F/U Patient here to follow-up diabetes mellitus her last A1c was 7.8% in December 2016. She is taking  glipizide 5 mg in the morning 2.5 mg in the evening. Her blood sugars range 91-118, np hypoglycemia,  blood pressure 110-130/66-60's,   Her blood pressure is fairly well controlled for her age on losartan HCTZ as well as Lopressor.  She still cared for by her children she does not have any behavioral problems she's currently on Aricept, her grandson has noticed some change in her memory, she forgets names, often will forget lunch at home. He is now calling to remind her to eat, they are also looking into the senior center for the day programs     Review Of Systems:  GEN- denies fatigue, fever, weight loss,weakness, recent illness HEENT- denies eye drainage, change in vision, nasal discharge, CVS- denies chest pain, palpitations RESP- denies SOB, cough, wheeze ABD- denies N/V, change in stools, abd pain GU- denies dysuria, hematuria, dribbling, incontinence MSK- denies joint pain, muscle aches, injury Neuro- denies headache, dizziness, syncope, seizure activity       Objective:    BP 140/80 mmHg  Pulse 78  Temp(Src) 98.5 F (36.9 C) (Oral)  Resp 12  Ht 5\' 4"  (1.626 m)  Wt 125 lb (56.7 kg)  BMI 21.45 kg/m2 GEN- NAD, alert and oriented x3 HEENT- PERRL, EOMI, non injected sclera, pink conjunctiva, MMM, oropharynx clear Neck- Supple, no thyromegaly CVS- RRR, 2/6SEM RESP-CTAB ABD-NABS,soft,NT,ND EXT- No edema Pulses- Radial, DP- 2+        Assessment & Plan:      Problem List Items Addressed This Visit    None      Note: This dictation was prepared with Dragon dictation along with smaller phrase technology. Any transcriptional errors that result from  this process are unintentional.

## 2016-01-04 NOTE — Assessment & Plan Note (Signed)
Continue aricept, xanax at bedtime for chronic insomnia Advised trying the senior center

## 2016-01-04 NOTE — Assessment & Plan Note (Signed)
Diabetes has been controlled, recheck labs, continue current meds

## 2016-01-04 NOTE — Assessment & Plan Note (Signed)
Well controlled, no change to meds 

## 2016-01-04 NOTE — Patient Instructions (Addendum)
F/U 6 weeks  We will call with labs

## 2016-01-06 ENCOUNTER — Other Ambulatory Visit: Payer: Self-pay | Admitting: *Deleted

## 2016-01-06 DIAGNOSIS — Z23 Encounter for immunization: Secondary | ICD-10-CM

## 2016-01-06 MED ORDER — GLIPIZIDE 5 MG PO TABS: 5.0000 mg | ORAL_TABLET | Freq: Two times a day (BID) | ORAL | Status: DC

## 2016-01-11 ENCOUNTER — Emergency Department (HOSPITAL_COMMUNITY): Payer: Medicare HMO

## 2016-01-11 ENCOUNTER — Encounter (HOSPITAL_COMMUNITY): Payer: Self-pay | Admitting: Emergency Medicine

## 2016-01-11 ENCOUNTER — Observation Stay (HOSPITAL_COMMUNITY)
Admission: EM | Admit: 2016-01-11 | Discharge: 2016-01-13 | Disposition: A | Discharge status: Home / Self Care | Payer: Medicare HMO | Attending: Family Medicine | Admitting: Family Medicine

## 2016-01-11 ENCOUNTER — Telehealth: Payer: Self-pay | Admitting: *Deleted

## 2016-01-11 DIAGNOSIS — R55 Syncope and collapse: Principal | ICD-10-CM

## 2016-01-11 DIAGNOSIS — F0390 Unspecified dementia without behavioral disturbance: Secondary | ICD-10-CM | POA: Diagnosis not present

## 2016-01-11 DIAGNOSIS — N390 Urinary tract infection, site not specified: Secondary | ICD-10-CM | POA: Diagnosis present

## 2016-01-11 DIAGNOSIS — M545 Low back pain: Secondary | ICD-10-CM | POA: Diagnosis not present

## 2016-01-11 DIAGNOSIS — F03A0 Unspecified dementia, mild, without behavioral disturbance, psychotic disturbance, mood disturbance, and anxiety: Secondary | ICD-10-CM

## 2016-01-11 DIAGNOSIS — Z79899 Other long term (current) drug therapy: Secondary | ICD-10-CM | POA: Diagnosis not present

## 2016-01-11 DIAGNOSIS — N183 Chronic kidney disease, stage 3 unspecified: Secondary | ICD-10-CM | POA: Diagnosis present

## 2016-01-11 DIAGNOSIS — I10 Essential (primary) hypertension: Secondary | ICD-10-CM | POA: Diagnosis not present

## 2016-01-11 DIAGNOSIS — S3992XA Unspecified injury of lower back, initial encounter: Secondary | ICD-10-CM | POA: Diagnosis not present

## 2016-01-11 DIAGNOSIS — E119 Type 2 diabetes mellitus without complications: Secondary | ICD-10-CM | POA: Insufficient documentation

## 2016-01-11 DIAGNOSIS — Z7984 Long term (current) use of oral hypoglycemic drugs: Secondary | ICD-10-CM | POA: Insufficient documentation

## 2016-01-11 DIAGNOSIS — N182 Chronic kidney disease, stage 2 (mild): Secondary | ICD-10-CM | POA: Diagnosis not present

## 2016-01-11 DIAGNOSIS — E785 Hyperlipidemia, unspecified: Secondary | ICD-10-CM | POA: Diagnosis not present

## 2016-01-11 DIAGNOSIS — I129 Hypertensive chronic kidney disease with stage 1 through stage 4 chronic kidney disease, or unspecified chronic kidney disease: Secondary | ICD-10-CM | POA: Insufficient documentation

## 2016-01-11 DIAGNOSIS — E1122 Type 2 diabetes mellitus with diabetic chronic kidney disease: Secondary | ICD-10-CM

## 2016-01-11 DIAGNOSIS — R69 Illness, unspecified: Secondary | ICD-10-CM | POA: Diagnosis not present

## 2016-01-11 DIAGNOSIS — Z23 Encounter for immunization: Secondary | ICD-10-CM

## 2016-01-11 DIAGNOSIS — E1129 Type 2 diabetes mellitus with other diabetic kidney complication: Secondary | ICD-10-CM | POA: Diagnosis present

## 2016-01-11 DIAGNOSIS — S79911A Unspecified injury of right hip, initial encounter: Secondary | ICD-10-CM | POA: Diagnosis not present

## 2016-01-11 DIAGNOSIS — S79912A Unspecified injury of left hip, initial encounter: Secondary | ICD-10-CM | POA: Diagnosis not present

## 2016-01-11 DIAGNOSIS — R569 Unspecified convulsions: Secondary | ICD-10-CM | POA: Diagnosis not present

## 2016-01-11 LAB — TROPONIN I
Troponin I: <0.03 ng/mL (ref <0.031)
Troponin I: <0.03 ng/mL (ref <0.031)

## 2016-01-11 LAB — COMPREHENSIVE METABOLIC PANEL
ALK PHOS: 57 U/L (ref 38–126)
ALT: 13 U/L — AB (ref 14–54)
AST: 18 U/L (ref 15–41)
Albumin: 3.6 g/dL (ref 3.5–5.0)
Anion gap: 8 (ref 5–15)
BILIRUBIN TOTAL: 0.5 mg/dL (ref 0.3–1.2)
BUN: 27 mg/dL — ABNORMAL HIGH (ref 6–20)
CALCIUM: 8.9 mg/dL (ref 8.9–10.3)
CO2: 27 mmol/L (ref 22–32)
CREATININE: 1.31 mg/dL — AB (ref 0.44–1.00)
Chloride: 105 mmol/L (ref 101–111)
GFR, EST AFRICAN AMERICAN: 42 mL/min — AB (ref >60)
GFR, EST NON AFRICAN AMERICAN: 37 mL/min — AB (ref >60)
Glucose, Bld: 235 mg/dL — ABNORMAL HIGH (ref 65–99)
Potassium: 4.1 mmol/L (ref 3.5–5.1)
Sodium: 140 mmol/L (ref 135–145)
TOTAL PROTEIN: 6.6 g/dL (ref 6.5–8.1)

## 2016-01-11 LAB — CBC
HEMATOCRIT: 32.6 % — AB (ref 36.0–46.0)
Hemoglobin: 10.5 g/dL — ABNORMAL LOW (ref 12.0–15.0)
MCH: 27.1 pg (ref 26.0–34.0)
MCHC: 32.2 g/dL (ref 30.0–36.0)
MCV: 84.2 fL (ref 78.0–100.0)
Platelets: 202 10*3/uL (ref 150–400)
RBC: 3.87 MIL/uL (ref 3.87–5.11)
RDW: 12.9 % (ref 11.5–15.5)
WBC: 14.7 10*3/uL — AB (ref 4.0–10.5)

## 2016-01-11 LAB — RAPID URINE DRUG SCREEN, HOSP PERFORMED
Amphetamines: NOT DETECTED
Barbiturates: NOT DETECTED
Benzodiazepines: NOT DETECTED
COCAINE: NOT DETECTED
OPIATES: NOT DETECTED
Tetrahydrocannabinol: NOT DETECTED

## 2016-01-11 LAB — DIFFERENTIAL
BASOS PCT: 0 %
Basophils Absolute: 0 10*3/uL (ref 0.0–0.1)
Eosinophils Absolute: 0 10*3/uL (ref 0.0–0.7)
Eosinophils Relative: 0 %
LYMPHS ABS: 1.8 10*3/uL (ref 0.7–4.0)
LYMPHS PCT: 12 %
MONO ABS: 1.1 10*3/uL — AB (ref 0.1–1.0)
MONOS PCT: 7 %
NEUTROS ABS: 11.8 10*3/uL — AB (ref 1.7–7.7)
Neutrophils Relative %: 81 %

## 2016-01-11 LAB — URINALYSIS, ROUTINE W REFLEX MICROSCOPIC
BILIRUBIN URINE: NEGATIVE
Glucose, UA: NEGATIVE mg/dL
NITRITE: NEGATIVE
PH: 6 (ref 5.0–8.0)
Specific Gravity, Urine: 1.02 (ref 1.005–1.030)

## 2016-01-11 LAB — URINE MICROSCOPIC-ADD ON

## 2016-01-11 LAB — PROTIME-INR
INR: 1.08 (ref 0.00–1.49)
Prothrombin Time: 14.2 seconds (ref 11.6–15.2)

## 2016-01-11 LAB — APTT: aPTT: 22 seconds — ABNORMAL LOW (ref 24–37)

## 2016-01-11 LAB — GLUCOSE, CAPILLARY: GLUCOSE-CAPILLARY: 308 mg/dL — AB (ref 65–99)

## 2016-01-11 LAB — TSH: TSH: 0.423 u[IU]/mL (ref 0.350–4.500)

## 2016-01-11 LAB — ETHANOL: Alcohol, Ethyl (B): <5 mg/dL (ref <5)

## 2016-01-11 MED ORDER — SODIUM CHLORIDE 0.9% FLUSH
3.0000 mL | Freq: Two times a day (BID) | INTRAVENOUS | Status: DC
  Administered 2016-01-11 – 2016-01-12 (×2): 3 mL via INTRAVENOUS

## 2016-01-11 MED ORDER — HYDROCHLOROTHIAZIDE 25 MG PO TABS
25.0000 mg | ORAL_TABLET | Freq: Every day | ORAL | Status: DC
  Administered 2016-01-11 – 2016-01-12 (×2): 25 mg via ORAL
  Filled 2016-01-11 (×2): qty 1

## 2016-01-11 MED ORDER — DEXTROSE 5 % IV SOLN
INTRAVENOUS | Status: AC
  Filled 2016-01-11: qty 10

## 2016-01-11 MED ORDER — ACETAMINOPHEN 650 MG RE SUPP
650.0000 mg | Freq: Four times a day (QID) | RECTAL | Status: DC | PRN
Start: 2016-01-11 — End: 2016-01-13

## 2016-01-11 MED ORDER — ALPRAZOLAM 0.25 MG PO TABS
0.2500 mg | ORAL_TABLET | Freq: Every evening | ORAL | Status: DC | PRN
  Administered 2016-01-11 – 2016-01-12 (×2): 0.25 mg via ORAL
  Filled 2016-01-11 (×2): qty 1

## 2016-01-11 MED ORDER — ACETAMINOPHEN 325 MG PO TABS: 650.0000 mg | ORAL_TABLET | Freq: Four times a day (QID) | ORAL | Status: DC | PRN

## 2016-01-11 MED ORDER — LOSARTAN POTASSIUM-HCTZ 100-25 MG PO TABS: 1.0000 | ORAL_TABLET | Freq: Every day | ORAL | Status: DC

## 2016-01-11 MED ORDER — METOPROLOL TARTRATE 25 MG PO TABS
25.0000 mg | ORAL_TABLET | Freq: Two times a day (BID) | ORAL | Status: DC
  Administered 2016-01-11 – 2016-01-13 (×4): 25 mg via ORAL
  Filled 2016-01-11 (×4): qty 1

## 2016-01-11 MED ORDER — GLIPIZIDE 5 MG PO TABS
5.0000 mg | ORAL_TABLET | Freq: Two times a day (BID) | ORAL | Status: DC
  Administered 2016-01-11 – 2016-01-13 (×4): 5 mg via ORAL
  Filled 2016-01-11 (×5): qty 1

## 2016-01-11 MED ORDER — DEXTROSE 5 % IV SOLN
1.0000 g | INTRAVENOUS | Status: DC
  Administered 2016-01-11 – 2016-01-12 (×2): 1 g via INTRAVENOUS
  Filled 2016-01-11 (×3): qty 10

## 2016-01-11 MED ORDER — ONDANSETRON HCL 4 MG/2ML IJ SOLN: 4.0000 mg | Freq: Four times a day (QID) | INTRAMUSCULAR | Status: DC | PRN

## 2016-01-11 MED ORDER — INSULIN ASPART 100 UNIT/ML ~~LOC~~ SOLN
0.0000 [IU] | Freq: Three times a day (TID) | SUBCUTANEOUS | Status: DC
  Administered 2016-01-12: 5 [IU] via SUBCUTANEOUS

## 2016-01-11 MED ORDER — ROSUVASTATIN CALCIUM 20 MG PO TABS
20.0000 mg | ORAL_TABLET | Freq: Every day | ORAL | Status: DC
  Administered 2016-01-11 – 2016-01-12 (×2): 20 mg via ORAL
  Filled 2016-01-11 (×2): qty 1

## 2016-01-11 MED ORDER — CYCLOBENZAPRINE HCL 10 MG PO TABS
5.0000 mg | ORAL_TABLET | Freq: Three times a day (TID) | ORAL | Status: DC | PRN
  Administered 2016-01-11 – 2016-01-12 (×3): 5 mg via ORAL
  Filled 2016-01-11 (×3): qty 1

## 2016-01-11 MED ORDER — LOSARTAN POTASSIUM 50 MG PO TABS
100.0000 mg | ORAL_TABLET | Freq: Every day | ORAL | Status: DC
  Administered 2016-01-11 – 2016-01-13 (×3): 100 mg via ORAL
  Filled 2016-01-11 (×4): qty 2

## 2016-01-11 MED ORDER — ONDANSETRON HCL 4 MG PO TABS: 4.0000 mg | ORAL_TABLET | Freq: Four times a day (QID) | ORAL | Status: DC | PRN

## 2016-01-11 MED ORDER — DONEPEZIL HCL 5 MG PO TABS
10.0000 mg | ORAL_TABLET | Freq: Every day | ORAL | Status: DC
  Administered 2016-01-11 – 2016-01-12 (×2): 10 mg via ORAL
  Filled 2016-01-11 (×2): qty 2

## 2016-01-11 MED ORDER — SODIUM CHLORIDE 0.9 % IV SOLN
INTRAVENOUS | Status: DC
  Administered 2016-01-11 – 2016-01-13 (×4): via INTRAVENOUS

## 2016-01-11 MED ORDER — ENOXAPARIN SODIUM 30 MG/0.3ML ~~LOC~~ SOLN
30.0000 mg | SUBCUTANEOUS | Status: DC
  Administered 2016-01-11 – 2016-01-12 (×2): 30 mg via SUBCUTANEOUS
  Filled 2016-01-11 (×2): qty 0.3

## 2016-01-11 MED ORDER — INSULIN ASPART 100 UNIT/ML ~~LOC~~ SOLN
0.0000 [IU] | Freq: Every day | SUBCUTANEOUS | Status: DC
  Administered 2016-01-11: 4 [IU] via SUBCUTANEOUS
  Administered 2016-01-12: 3 [IU] via SUBCUTANEOUS

## 2016-01-11 NOTE — ED Notes (Signed)
Pt had syncopal episode at home where she fell and hit her head. Per family, pt was diaphoretic and had soiled self during episode. In addition, they state pt experienced LOC. Per family, this has happened twice before.

## 2016-01-11 NOTE — Care Management Obs Status (Signed)
[place] NOTIFICATION   Patient Details  Name: Betty [name] MRN: [id number] Date of Birth: [birth year]   Medicare Observation Status Notification Given:  Yes    Alvie Heidelberg, RN 01/11/2016, 11:34 AM

## 2016-01-11 NOTE — ED Notes (Signed)
Report given to Lattie Haw, RN unit 300. Ready for pt.

## 2016-01-11 NOTE — Telephone Encounter (Signed)
Received call from patient daughter.   Reports that patient had vertigo and fainted. States that she hit bottom on floor and is complaining of pain.  Reports that she is at [facility] ER for eval.   MD to be made aware.

## 2016-01-11 NOTE — Telephone Encounter (Signed)
noted 

## 2016-01-11 NOTE — H&P (Addendum)
[facility] History and Physical  Betty [name] [id number] DOB: 12/03/1930 DOA: 01/11/2016  Referring physician: Fredia Sorrow, MD  PCP: Vic Blackbird, MD   Chief Complaint: Loss of consciousness   HPI: Betty [name] is a 82 y.o. female with past medical history of DM Type2, HLD, HTN, mild dementia, and CKD Stage III presented with complaints of syncope. Patient reports being in her usual state of health this morning she had an episode of syncope. She denies any presyncopal dizziness, chest pain, shortness of breath. Her daughter heard her fall and tended to hurt immediately. There was no reported jerking movement. Patient's eyes were rolled back in her head for approximately 30 seconds, after which she regained consciousness and said that she needed to vomit. She also had a bowel movement at that time. There does not appear to be any reported postictal period. She does not have any palpitations. She had no shortness of breath, cough or fever. No dysuria. Daughter reports that she's not been eating well lately. These episodes have happened in the past with unclear etiology. She's been referred for admission.   Review of Systems:  Pertinent positives as per HPI, otherwise negative  Past Medical History  Diagnosis Date  . Diabetes mellitus   . Hypertension   . Hyperlipidemia   . Dementia   . Chronic kidney disease     stage II   Past Surgical History  Procedure Laterality Date  . Abdominal hysterectomy    . Back surgery     Social History:  reports that she has never smoked. She has never used smokeless tobacco. She reports that she does not drink alcohol or use illicit drugs.  No Known Allergies  Family History  Problem Relation Age of Onset  . Hypertension Mother   . Cancer Father     colon   . Cancer Sister     Breast cancer  . Osteoporosis Sister     Prior to Admission medications   Medication Sig Start Date End Date Taking? Authorizing  Provider  ALPRAZolam (XANAX) 0.25 MG tablet Take 1 tablet (0.25 mg total) by mouth at bedtime as needed for anxiety or sleep. 01/04/16  Yes Alycia Rossetti, MD  donepezil (ARICEPT) 10 MG tablet take 1 tablet by mouth at bedtime for memory 01/04/16  Yes Alycia Rossetti, MD  glipiZIDE (GLUCOTROL) 5 MG tablet Take 1 tablet (5 mg total) by mouth 2 (two) times daily before a meal. 01/06/16  Yes Alycia Rossetti, MD  glucose blood test strip Use as instructed for once daily testing  With Easymax meter 12/31/12  Yes Alycia Rossetti, MD  losartan-hydrochlorothiazide (HYZAAR) 100-25 MG tablet TAKE 1 TABLET FOR BLOOD PRESSURE IN MORNING 01/04/16  Yes Alycia Rossetti, MD  metoprolol tartrate (LOPRESSOR) 25 MG tablet take 1 tablet by mouth twice a day with meals  FOR BLOOD PRESSURE 01/04/16  Yes Alycia Rossetti, MD  rosuvastatin (CRESTOR) 20 MG tablet Take 1 tablet (20 mg total) by mouth at bedtime. FOR CHOLESTEROL 01/04/16  Yes Alycia Rossetti, MD   Physical Exam: Filed Vitals:   01/11/16 0830 01/11/16 0930 01/11/16 1040 01/11/16 1651  BP: 151/56 156/56 119/34 159/49  Pulse:  65 73 65  Temp:   99 F (37.2 C) 98.7 F (37.1 C)  TempSrc:   Oral   Resp: 18 19 18 16   Height:   5\' 1"  (1.549 m)   Weight:   58.8 kg (129 lb 10.1 oz)   SpO2:  99% 100% 100%    Wt Readings from Last 3 Encounters:  01/11/16 58.8 kg (129 lb 10.1 oz)  01/04/16 56.7 kg (125 lb)  10/10/15 61.236 kg (135 lb)    General:  Appears calm and comfortable Eyes: PERRL, normal lids, irises & conjunctiva ENT: grossly normal hearing, lips & tongue Neck: no LAD, masses or thyromegaly Cardiovascular: RRR, no m/r/g. No LE edema. Telemetry: SR, no arrhythmias  Respiratory: CTA bilaterally, no w/r/r. Normal respiratory effort. Abdomen: soft, ntnd Skin: no rash or induration seen on limited exam Musculoskeletal: grossly normal tone BUE/BLE Psychiatric: grossly normal mood and affect, speech fluent and appropriate Neurologic: grossly  non-focal.          Labs on Admission:  Basic Metabolic Panel:  Recent Labs Lab 01/11/16 0650  NA 140  K 4.1  CL 105  CO2 27  GLUCOSE 235*  BUN 27*  CREATININE 1.31*  CALCIUM 8.9   Liver Function Tests:  Recent Labs Lab 01/11/16 0650  AST 18  ALT 13*  ALKPHOS 57  BILITOT 0.5  PROT 6.6  ALBUMIN 3.6   No results for input(s): LIPASE, AMYLASE in the last 168 hours. No results for input(s): AMMONIA in the last 168 hours. CBC:  Recent Labs Lab 01/11/16 0650  WBC 14.7*  NEUTROABS 11.8*  HGB 10.5*  HCT 32.6*  MCV 84.2  PLT 202   Cardiac Enzymes:  Recent Labs Lab 01/11/16 0650  TROPONINI <0.03    BNP (last 3 results) No results for input(s): BNP in the last 8760 hours.  ProBNP (last 3 results) No results for input(s): PROBNP in the last 8760 hours.  CBG: No results for input(s): GLUCAP in the last 168 hours.  Radiological Exams on Admission: Dg Chest 2 View  01/11/2016  CLINICAL DATA:  Syncope EXAM: CHEST  2 VIEW COMPARISON:  10/10/2015 FINDINGS: The heart size is normal. There is aortic atherosclerosis. No pleural effusion or edema. No airspace consolidation noted. IMPRESSION: 1. No acute cardiopulmonary abnormalities. 2. Aortic atherosclerosis. Electronically Signed   By: Kerby Moors M.D.   On: 01/11/2016 08:40   Dg Lumbar Spine Complete  01/11/2016  CLINICAL DATA:  Fall.  Initial evaluation. EXAM: LUMBAR SPINE - COMPLETE 4+ VIEW COMPARISON:  CT 08/06/2010 FINDINGS: Scoliosis concave right. Prior L4-L5 posterior and interbody fusion. No acute abnormality identified. No evidence of fracture. Aortoiliac atherosclerotic vascular calcification. IMPRESSION: 1. Mild scoliosis concave right. L4-L5 posterior interbody fusion. Diffuse osteopenia. No acute bony abnormality. No evidence of fracture. 2. Aortoiliac atherosclerotic vascular disease . Electronically Signed   By: Marcello Moores  Register   On: 01/11/2016 08:32   Ct Head Wo Contrast  01/11/2016  CLINICAL  DATA:  82 year old hypertensive diabetic female with dementia presenting with syncopal episode with loss of consciousness hitting posterior aspect of head yesterday. Initial encounter. EXAM: CT HEAD WITHOUT CONTRAST TECHNIQUE: Contiguous axial images were obtained from the base of the skull through the vertex without intravenous contrast. COMPARISON:  10/10/2015 and 05/11/2009 CT. FINDINGS: No skull fracture or intracranial hemorrhage. Mild global atrophy without hydrocephalus. Mild chronic small vessel disease changes without CT evidence of large acute infarct. Vascular calcifications. No intracranial mass lesion noted on this unenhanced exam. Orbital structures unremarkable. Mastoid air cells, middle ear cavities and paranasal sinuses which are visualized are clear. IMPRESSION: No skull fracture or intracranial hemorrhage. No CT evidence of large acute infarct. Electronically Signed   By: Genia Del M.D.   On: 01/11/2016 07:18   Dg Hips Bilat With Pelvis 3-4 Views  01/11/2016  CLINICAL DATA:  Fall. EXAM: DG HIP (WITH OR WITHOUT PELVIS) 3-4V BILAT COMPARISON:  No prior. FINDINGS: L4-L5 fusion. Diffuse osteopenia and degenerative change. No acute bony or joint abnormality identified. No evidence of fracture. Peripheral vascular calcification . IMPRESSION: 1. Diffuse osteopenia degenerative change. Prior L4-L5 fusion. No acute bony abnormality. 2. Peripheral vascular disease. Electronically Signed   By: Marcello Moores  Register   On: 01/11/2016 08:34    EKG: Independently reviewed. Sinus rhythm without any acute changes.  Assessment/Plan Principal Problem:   Syncope Active Problems:   Type II diabetes mellitus with renal manifestations (HCC)   Essential hypertension, benign   Hyperlipidemia   Mild dementia   CKD (chronic kidney disease), stage III   1. Syncope versus seizure. Etiology is not entirely clear. May have had a vasovagal episode precipitated by dehydration. She is not particularly  orthostatic. We'll start the patient on IV fluids. Check echocardiogram. Monitor on telemetry. Check cardiac markers. Check TSH. Check EEG. 2. Hypertension. Stable. Continue outpatient regimen 3. Hyperlipidemia. Continue statin. 4. CKD stage III. Creatinine appears to be her baseline. We'll continue to monitor. 5. Diabetes. Continue glipizide. Follow blood sugars and start sliding scale insulin. 6. Dementia. Continue aricept 7. Possible UTI. Urinalysis indicates possible UTI. Start empirically on rocephin and check urine culture   Code Status: full code DVT Prophylaxis: lovenox Family Communication: discussed with daughter at the bedside Disposition Plan: discharge home once improved  Time spent: 85mins  Kathie Dike, MD  [facility] Pager [phone]

## 2016-01-11 NOTE — ED Provider Notes (Signed)
CSN: [id number]     Arrival date & time 01/11/16  [id number] History   First MD Initiated Contact with Patient 01/11/16 [phone]     Chief Complaint  Patient presents with  . Loss of Consciousness     (Consider location/radiation/quality/duration/timing/severity/associated sxs/prior Treatment) Patient is a 82 y.o. female presenting with syncope. The history is provided by the patient, the EMS personnel and a relative.  Loss of Consciousness Associated symptoms: confusion and vomiting   Associated symptoms: no chest pain, no fever, no headaches and no shortness of breath    patient with syncopal episode. Patient fell suddenly while in the hallway she had been up about 3040 minutes seem to be fine. Family members there did not see her actually go down but heard her fall lumbar there quickly. No evidence of any seizure activity. Family member says this is happening at least twice before. When she got on the floor and started to wake up there was an episode of vomiting and incontinence of stool.  Re: Family members based on the sound patient went down hard backwards. Patient was complaining of low back pain but denies it now. Patient has a history of dementia so family members stated that is difficult to assess how she felt prior and also to interpret her current complaints or lack of complaints.  Past Medical History  Diagnosis Date  . Diabetes mellitus   . Hypertension   . Hyperlipidemia   . Dementia   . Chronic kidney disease     stage II   Past Surgical History  Procedure Laterality Date  . Abdominal hysterectomy    . Back surgery     Family History  Problem Relation Age of Onset  . Hypertension Mother   . Cancer Father     colon   . Cancer Sister     Breast cancer  . Osteoporosis Sister    Social History  Substance Use Topics  . Smoking status: Never Smoker   . Smokeless tobacco: Never Used  . Alcohol Use: No   OB History    No data available     Review of Systems   Constitutional: Negative for fever.  HENT: Negative for congestion.   Eyes: Negative for redness.  Respiratory: Negative for shortness of breath.   Cardiovascular: Positive for syncope. Negative for chest pain.  Gastrointestinal: Positive for vomiting. Negative for abdominal pain.  Genitourinary: Negative for dysuria.  Musculoskeletal: Positive for back pain. Negative for neck pain.  Skin: Negative for rash.  Neurological: Positive for syncope. Negative for headaches.  Hematological: Does not bruise/bleed easily.  Psychiatric/Behavioral: Positive for confusion.      Allergies  Review of patient's allergies indicates no known allergies.  Home Medications   Prior to Admission medications   Medication Sig Start Date End Date Taking? Authorizing Provider  ALPRAZolam (XANAX) 0.25 MG tablet Take 1 tablet (0.25 mg total) by mouth at bedtime as needed for anxiety or sleep. 01/04/16  Yes Alycia Rossetti, MD  donepezil (ARICEPT) 10 MG tablet take 1 tablet by mouth at bedtime for memory 01/04/16  Yes Alycia Rossetti, MD  glipiZIDE (GLUCOTROL) 5 MG tablet Take 1 tablet (5 mg total) by mouth 2 (two) times daily before a meal. 01/06/16  Yes Alycia Rossetti, MD  glucose blood test strip Use as instructed for once daily testing  With Easymax meter 12/31/12  Yes Alycia Rossetti, MD  losartan-hydrochlorothiazide (HYZAAR) 100-25 MG tablet TAKE 1 TABLET FOR BLOOD PRESSURE IN [facility] 01/04/16  Yes Alycia Rossetti, MD  metoprolol tartrate (LOPRESSOR) 25 MG tablet take 1 tablet by mouth twice a day with meals  FOR BLOOD PRESSURE 01/04/16  Yes Alycia Rossetti, MD  rosuvastatin (CRESTOR) 20 MG tablet Take 1 tablet (20 mg total) by mouth at bedtime. FOR CHOLESTEROL 01/04/16  Yes Alycia Rossetti, MD   BP 141/49 mmHg  Pulse 59  Resp 17  Ht 5\' 1"  (1.549 m)  Wt 57.153 kg  BMI 23.82 kg/m2  SpO2 97% Physical Exam  Constitutional: She appears well-developed and well-nourished. No distress.  HENT:  Head:  Normocephalic and atraumatic.  Mouth/Throat: Oropharynx is clear and moist.  Eyes: Conjunctivae and EOM are normal. Pupils are equal, round, and reactive to light.  Neck: Normal range of motion. Neck supple.  Cardiovascular: Normal rate, regular rhythm and normal heart sounds.   No murmur heard. Pulmonary/Chest: Effort normal and breath sounds normal. No respiratory distress.  Abdominal: Soft. Bowel sounds are normal. There is no tenderness.  Musculoskeletal: Normal range of motion. She exhibits tenderness.  Mild tenderness to the low back area to palpation.  Neurological: She is alert. No cranial nerve deficit. She exhibits normal muscle tone. Coordination normal.  Skin: Skin is warm. No rash noted.  Nursing note and vitals reviewed.   ED Course  Procedures (including critical care time) Labs Review Labs Reviewed  APTT - Abnormal; Notable for the following:    aPTT 22 (*)    All other components within normal limits  CBC - Abnormal; Notable for the following:    WBC 14.7 (*)    Hemoglobin 10.5 (*)    HCT 32.6 (*)    All other components within normal limits  DIFFERENTIAL - Abnormal; Notable for the following:    Neutro Abs 11.8 (*)    Monocytes Absolute 1.1 (*)    All other components within normal limits  COMPREHENSIVE METABOLIC PANEL - Abnormal; Notable for the following:    Glucose, Bld 235 (*)    BUN 27 (*)    Creatinine, Ser 1.31 (*)    ALT 13 (*)    GFR calc non Af Amer 37 (*)    GFR calc Af Amer 42 (*)    All other components within normal limits  ETHANOL  PROTIME-INR  TROPONIN I  URINE RAPID DRUG SCREEN, HOSP PERFORMED  URINALYSIS, ROUTINE W REFLEX MICROSCOPIC (NOT AT [facility])   Results for orders placed or performed during the hospital encounter of 01/11/16  Ethanol  Result Value Ref Range   Alcohol, Ethyl (B) <5 <5 mg/dL  Protime-INR  Result Value Ref Range   Prothrombin Time 14.2 11.6 - 15.2 seconds   INR 1.08 0.00 - 1.49  APTT  Result Value Ref Range    aPTT 22 (L) 24 - 37 seconds  CBC  Result Value Ref Range   WBC 14.7 (H) 4.0 - 10.5 K/uL   RBC 3.87 3.87 - 5.11 MIL/uL   Hemoglobin 10.5 (L) 12.0 - 15.0 g/dL   HCT 32.6 (L) 36.0 - 46.0 %   MCV 84.2 78.0 - 100.0 fL   MCH 27.1 26.0 - 34.0 pg   MCHC 32.2 30.0 - 36.0 g/dL   RDW 12.9 11.5 - 15.5 %   Platelets 202 150 - 400 K/uL  Differential  Result Value Ref Range   Neutrophils Relative % 81 %   Neutro Abs 11.8 (H) 1.7 - 7.7 K/uL   Lymphocytes Relative 12 %   Lymphs Abs 1.8 0.7 - 4.0 K/uL  Monocytes Relative 7 %   Monocytes Absolute 1.1 (H) 0.1 - 1.0 K/uL   Eosinophils Relative 0 %   Eosinophils Absolute 0.0 0.0 - 0.7 K/uL   Basophils Relative 0 %   Basophils Absolute 0.0 0.0 - 0.1 K/uL  Comprehensive metabolic panel  Result Value Ref Range   Sodium 140 135 - 145 mmol/L   Potassium 4.1 3.5 - 5.1 mmol/L   Chloride 105 101 - 111 mmol/L   CO2 27 22 - 32 mmol/L   Glucose, Bld 235 (H) 65 - 99 mg/dL   BUN 27 (H) 6 - 20 mg/dL   Creatinine, Ser 1.31 (H) 0.44 - 1.00 mg/dL   Calcium 8.9 8.9 - 10.3 mg/dL   Total Protein 6.6 6.5 - 8.1 g/dL   Albumin 3.6 3.5 - 5.0 g/dL   AST 18 15 - 41 U/L   ALT 13 (L) 14 - 54 U/L   Alkaline Phosphatase 57 38 - 126 U/L   Total Bilirubin 0.5 0.3 - 1.2 mg/dL   GFR calc non Af Amer 37 (L) >60 mL/min   GFR calc Af Amer 42 (L) >60 mL/min   Anion gap 8 5 - 15  Troponin I  Result Value Ref Range   Troponin I <0.03 <0.031 ng/mL     Imaging Review Dg Chest 2 View  01/11/2016  CLINICAL DATA:  Syncope EXAM: CHEST  2 VIEW COMPARISON:  10/10/2015 FINDINGS: The heart size is normal. There is aortic atherosclerosis. No pleural effusion or edema. No airspace consolidation noted. IMPRESSION: 1. No acute cardiopulmonary abnormalities. 2. Aortic atherosclerosis. Electronically Signed   By: Kerby Moors M.D.   On: 01/11/2016 08:40   Dg Lumbar Spine Complete  01/11/2016  CLINICAL DATA:  Fall.  Initial evaluation. EXAM: LUMBAR SPINE - COMPLETE 4+ VIEW COMPARISON:   CT 08/06/2010 FINDINGS: Scoliosis concave right. Prior L4-L5 posterior and interbody fusion. No acute abnormality identified. No evidence of fracture. Aortoiliac atherosclerotic vascular calcification. IMPRESSION: 1. Mild scoliosis concave right. L4-L5 posterior interbody fusion. Diffuse osteopenia. No acute bony abnormality. No evidence of fracture. 2. Aortoiliac atherosclerotic vascular disease . Electronically Signed   By: Marcello Moores  Register   On: 01/11/2016 08:32   Ct Head Wo Contrast  01/11/2016  CLINICAL DATA:  82 year old hypertensive diabetic female with dementia presenting with syncopal episode with loss of consciousness hitting posterior aspect of head yesterday. Initial encounter. EXAM: CT HEAD WITHOUT CONTRAST TECHNIQUE: Contiguous axial images were obtained from the base of the skull through the vertex without intravenous contrast. COMPARISON:  10/10/2015 and 05/11/2009 CT. FINDINGS: No skull fracture or intracranial hemorrhage. Mild global atrophy without hydrocephalus. Mild chronic small vessel disease changes without CT evidence of large acute infarct. Vascular calcifications. No intracranial mass lesion noted on this unenhanced exam. Orbital structures unremarkable. Mastoid air cells, middle ear cavities and paranasal sinuses which are visualized are clear. IMPRESSION: No skull fracture or intracranial hemorrhage. No CT evidence of large acute infarct. Electronically Signed   By: Genia Del M.D.   On: 01/11/2016 07:18   Dg Hips Bilat With Pelvis 3-4 Views  01/11/2016  CLINICAL DATA:  Fall. EXAM: DG HIP (WITH OR WITHOUT PELVIS) 3-4V BILAT COMPARISON:  No prior. FINDINGS: L4-L5 fusion. Diffuse osteopenia and degenerative change. No acute bony or joint abnormality identified. No evidence of fracture. Peripheral vascular calcification . IMPRESSION: 1. Diffuse osteopenia degenerative change. Prior L4-L5 fusion. No acute bony abnormality. 2. Peripheral vascular disease. Electronically Signed   By:  Marcello Moores  Register   On: 01/11/2016  08:34   I have personally reviewed and evaluated these images and lab results as part of my medical decision-making.   EKG Interpretation   Date/Time:  Monday January 11 2016 06:49:35 EDT Ventricular Rate:  67 PR Interval:  149 QRS Duration: 90 QT Interval:  430 QTC Calculation: [id number] R Axis:   10 Text Interpretation:  Sinus rhythm Low voltage, precordial leads Baseline  wander No significant change since last tracing 10 Oct 2015 Confirmed by  [facility]  MD-I, IVA ([id number]) on 01/11/2016 6:53:29 AM      MDM   Final diagnoses:  Syncope, unspecified syncope type  Mild dementia      Patient with unexplained syncopal episode. Michela Pitcher this happened 2 or 3 times in the past. Also was admitted in 2012 for this but that according to family was something entirely different that's been going on lately. Patient had been up for 30 minutes prior to the syncopal episode happened suddenly got family was close by but not directly there to witness it. They get there quickly there was no evidence of any seizure activity. Patient has a history of dementia so getting information about how she felt right before his difficult. She did have vomiting and incontinence when she went down. Discussed with hospitalist here they will admit to telemetry on soft for cardiac monitoring. Patient's workup including head CT chest x-ray x-ray of the low back hips and pelvis all negative for any injuries.  Fredia Sorrow, MD 01/11/16 0930

## 2016-01-11 NOTE — ED Notes (Signed)
MD at bedside. 

## 2016-01-11 NOTE — ED Notes (Signed)
Ambulatory to restroom. Steady gait.

## 2016-01-12 ENCOUNTER — Observation Stay (HOSPITAL_COMMUNITY)
Admit: 2016-01-12 | Discharge: 2016-01-12 | Disposition: A | Discharge status: Home / Self Care | Payer: Medicare HMO | Attending: Internal Medicine | Admitting: Internal Medicine

## 2016-01-12 ENCOUNTER — Observation Stay (HOSPITAL_BASED_OUTPATIENT_CLINIC_OR_DEPARTMENT_OTHER): Payer: Medicare HMO

## 2016-01-12 DIAGNOSIS — I10 Essential (primary) hypertension: Secondary | ICD-10-CM | POA: Diagnosis not present

## 2016-01-12 DIAGNOSIS — R55 Syncope and collapse: Secondary | ICD-10-CM | POA: Diagnosis not present

## 2016-01-12 DIAGNOSIS — E785 Hyperlipidemia, unspecified: Secondary | ICD-10-CM | POA: Diagnosis not present

## 2016-01-12 DIAGNOSIS — N183 Chronic kidney disease, stage 3 (moderate): Secondary | ICD-10-CM | POA: Diagnosis not present

## 2016-01-12 LAB — CBC
HEMATOCRIT: 28.4 % — AB (ref 36.0–46.0)
HEMOGLOBIN: 9.5 g/dL — AB (ref 12.0–15.0)
MCH: 27.9 pg (ref 26.0–34.0)
MCHC: 33.5 g/dL (ref 30.0–36.0)
MCV: 83.3 fL (ref 78.0–100.0)
Platelets: 188 10*3/uL (ref 150–400)
RBC: 3.41 MIL/uL — AB (ref 3.87–5.11)
RDW: 13.5 % (ref 11.5–15.5)
WBC: 9.6 10*3/uL (ref 4.0–10.5)

## 2016-01-12 LAB — GLUCOSE, CAPILLARY
GLUCOSE-CAPILLARY: 100 mg/dL — AB (ref 65–99)
GLUCOSE-CAPILLARY: 238 mg/dL — AB (ref 65–99)
Glucose-Capillary: 97 mg/dL (ref 65–99)
Glucose-Capillary: 267 mg/dL — ABNORMAL HIGH (ref 65–99)

## 2016-01-12 LAB — TROPONIN I
Troponin I: <0.03 ng/mL (ref <0.031)
Troponin I: <0.03 ng/mL (ref <0.031)

## 2016-01-12 LAB — ECHOCARDIOGRAM COMPLETE
Height: 61 in
WEIGHTICAEL: 2059.98 [oz_av]

## 2016-01-12 LAB — BASIC METABOLIC PANEL
ANION GAP: 10 (ref 5–15)
BUN: 26 mg/dL — ABNORMAL HIGH (ref 6–20)
CALCIUM: 8.4 mg/dL — AB (ref 8.9–10.3)
CO2: 25 mmol/L (ref 22–32)
Chloride: 107 mmol/L (ref 101–111)
Creatinine, Ser: 1.4 mg/dL — ABNORMAL HIGH (ref 0.44–1.00)
GFR, EST AFRICAN AMERICAN: 39 mL/min — AB (ref >60)
GFR, EST NON AFRICAN AMERICAN: 34 mL/min — AB (ref >60)
GLUCOSE: 109 mg/dL — AB (ref 65–99)
POTASSIUM: 3.7 mmol/L (ref 3.5–5.1)
Sodium: 142 mmol/L (ref 135–145)

## 2016-01-12 NOTE — Progress Notes (Signed)
Inpatient Diabetes Program Recommendations  AACE/ADA: New Consensus Statement on Inpatient Glycemic Control (2015)  Target Ranges:  Prepandial:   less than 140 mg/dL      Peak postprandial:   less than 180 mg/dL (1-2 hours)      Critically ill patients:  140 - 180 mg/dL   Review of Glycemic Control Results for Betty [name], Betty [name] (MRN [id number]) as of 01/12/2016 10:13  Ref. Range 01/11/2016 21:13 01/12/2016 07:43  Glucose-Capillary Latest Ref Range: 65-99 mg/dL 308 (H) 100 (H)  Results for Betty [name], Betty [name] (MRN [id number]) as of 01/12/2016 10:13  Ref. Range 01/04/2016 09:01  Hemoglobin A1C Latest Ref Range: <5.7 % 8.3 (H)   Diabetes history: DM Type 2 Outpatient Diabetes medications: Glucotrol 5 mg bid ac meals  Current orders for Inpatient glycemic control: Glucotrol 5 mg bid ac meals + Novolog Correction scale moderate  Inpatient Diabetes Program Recommendations:  Please consider discontinuing Glucotrol while in the hospital. Will follow.  Thank you, Nani Gasser. Cayce Quezada, RN, MSN, CDE Inpatient Glycemic Control Team Team Pager [phone] (8am-5pm) 01/12/2016 10:17 AM

## 2016-01-12 NOTE — Progress Notes (Signed)
EEG Completed; Results Pending  

## 2016-01-12 NOTE — Progress Notes (Signed)
Informed by central telemetry that patient had 6 beat run of VTach. Pt assessed and asymptomatic. Pt denies chest pain or discomfort. Vital signs are as follows: Temp 98.6, Pulse 68, Respirations 18, B/P 123/55, O2 100 % Room Air. MD E-Paged. No further instructions at this time.

## 2016-01-12 NOTE — Progress Notes (Signed)
[facility] PROGRESS NOTE  MAXENE STAUDACHER [id number] DOB: 07-03-1931 DOA: 01/11/2016 PCP: Vic Blackbird, MD  Summary  82 yof presented with complaints of syncope vs seizure. It appears she may have an element of dehydration and she has been started on IVF. HCTZ was discontinued and her cardiac enzymes were negative. ECHO and EEG have been ordered. UA indicated possible infection, for which she has been started on abx. Continue IVF for now and anticipate discharge home tomorrow.   Assessment/Plan: 1. Syncope versus seizure. Etiology is not entirely clear. May have had a vasovagal episode precipitated by dehydration. Orthostatic check and she did have a rise in heart rate. She did not get adequate hydration overnight, due to issues with her IV. Will continue on IV fluids. Follow up ECHO and EEG. She was noted to have 6 beat run of VTach on telemetry. Patient was asymptomatic at the time. TSH wnl. Anticipate discharge home tomorrow with outpatient cardiology and neurology follow up.  2. Hypertension. Stable. Will discontinue HCTZ since she is dehydrated. 3. Hyperlipidemia. Continue statin. 4. CKD stage III. Creatinine appears to be her baseline. Continue to monitor. 5. Diabetes. Continue glipizide. Follow blood sugars and start sliding scale insulin. A1C 8.3 6. Dementia. Continue aricept 7. Possible UTI. Urinalysis indicates possible UTI. Start empirically on rocephin and follow up urine culture.  Code Status: Full DVT prophylaxis: Lovenox Family Communication: Daughter and son bedside.  Disposition Plan: Discharge in 24 hours.    Consultants:  None  Procedures:  None  Antibiotics:  Rocephin 4/10>>  HPI/Subjective: Feeling well. No reports of chest pain, shortness of breath, n/v/d, or abd pain.  Objective: Filed Vitals:   01/12/16 0818 01/12/16 1035  BP: 123/55 110/40  Pulse: 68 63  Temp: 98.6 F (37 C)   Resp: 18     Intake/Output Summary (Last 24 hours)  at 01/12/16 1100 Last data filed at 01/12/16 1040  Gross per 24 hour  Intake    483 ml  Output    400 ml  Net     83 ml   Filed Weights   01/11/16 0629 01/11/16 1040 01/12/16 0400  Weight: 57.153 kg (126 lb) 58.8 kg (129 lb 10.1 oz) 58.4 kg (128 lb 12 oz)    Exam:  General: NAD, looks comfortable Cardiovascular: RRR, S1, S2  Respiratory: clear bilaterally, No wheezing, rales or rhonchi Abdomen: soft, non tender, no distention , bowel sounds normal Musculoskeletal: No edema b/l   Data Reviewed: Basic Metabolic Panel:  Recent Labs Lab 01/11/16 0650 01/12/16 0500  NA 140 142  K 4.1 3.7  CL 105 107  CO2 27 25  GLUCOSE 235* 109*  BUN 27* 26*  CREATININE 1.31* 1.40*  CALCIUM 8.9 8.4*   Liver Function Tests:  Recent Labs Lab 01/11/16 0650  AST 18  ALT 13*  ALKPHOS 57  BILITOT 0.5  PROT 6.6  ALBUMIN 3.6   No results for input(s): LIPASE, AMYLASE in the last 168 hours. No results for input(s): AMMONIA in the last 168 hours. CBC:  Recent Labs Lab 01/11/16 0650 01/12/16 0500  WBC 14.7* 9.6  NEUTROABS 11.8*  --   HGB 10.5* 9.5*  HCT 32.6* 28.4*  MCV 84.2 83.3  PLT 202 188   Cardiac Enzymes:  Recent Labs Lab 01/11/16 0650 01/11/16 1853 01/11/16 2343 01/12/16 0500  TROPONINI <0.03 <0.03 <0.03 <0.03   BNP (last 3 results) No results for input(s): BNP in the last 8760 hours.  ProBNP (last 3 results) No results for  input(s): PROBNP in the last 8760 hours.  CBG:  Recent Labs Lab 01/11/16 2113 01/12/16 0743  GLUCAP 308* 100*    No results found for this or any previous visit (from the past 240 hour(s)).   Studies: Dg Chest 2 View  01/11/2016  CLINICAL DATA:  Syncope EXAM: CHEST  2 VIEW COMPARISON:  10/10/2015 FINDINGS: The heart size is normal. There is aortic atherosclerosis. No pleural effusion or edema. No airspace consolidation noted. IMPRESSION: 1. No acute cardiopulmonary abnormalities. 2. Aortic atherosclerosis. Electronically Signed    By: Kerby Moors M.D.   On: 01/11/2016 08:40   Dg Lumbar Spine Complete  01/11/2016  CLINICAL DATA:  Fall.  Initial evaluation. EXAM: LUMBAR SPINE - COMPLETE 4+ VIEW COMPARISON:  CT 08/06/2010 FINDINGS: Scoliosis concave right. Prior L4-L5 posterior and interbody fusion. No acute abnormality identified. No evidence of fracture. Aortoiliac atherosclerotic vascular calcification. IMPRESSION: 1. Mild scoliosis concave right. L4-L5 posterior interbody fusion. Diffuse osteopenia. No acute bony abnormality. No evidence of fracture. 2. Aortoiliac atherosclerotic vascular disease . Electronically Signed   By: Marcello Moores  Register   On: 01/11/2016 08:32   Ct Head Wo Contrast  01/11/2016  CLINICAL DATA:  82 year old hypertensive diabetic female with dementia presenting with syncopal episode with loss of consciousness hitting posterior aspect of head yesterday. Initial encounter. EXAM: CT HEAD WITHOUT CONTRAST TECHNIQUE: Contiguous axial images were obtained from the base of the skull through the vertex without intravenous contrast. COMPARISON:  10/10/2015 and 05/11/2009 CT. FINDINGS: No skull fracture or intracranial hemorrhage. Mild global atrophy without hydrocephalus. Mild chronic small vessel disease changes without CT evidence of large acute infarct. Vascular calcifications. No intracranial mass lesion noted on this unenhanced exam. Orbital structures unremarkable. Mastoid air cells, middle ear cavities and paranasal sinuses which are visualized are clear. IMPRESSION: No skull fracture or intracranial hemorrhage. No CT evidence of large acute infarct. Electronically Signed   By: Genia Del M.D.   On: 01/11/2016 07:18   Dg Hips Bilat With Pelvis 3-4 Views  01/11/2016  CLINICAL DATA:  Fall. EXAM: DG HIP (WITH OR WITHOUT PELVIS) 3-4V BILAT COMPARISON:  No prior. FINDINGS: L4-L5 fusion. Diffuse osteopenia and degenerative change. No acute bony or joint abnormality identified. No evidence of fracture. Peripheral  vascular calcification . IMPRESSION: 1. Diffuse osteopenia degenerative change. Prior L4-L5 fusion. No acute bony abnormality. 2. Peripheral vascular disease. Electronically Signed   By: [place]   On: 01/11/2016 08:34    Scheduled Meds: . cefTRIAXone (ROCEPHIN)  IV  1 g Intravenous Q24H  . donepezil  10 mg Oral QHS  . enoxaparin (LOVENOX) injection  30 mg Subcutaneous Q24H  . glipiZIDE  5 mg Oral BID AC  . insulin aspart  0-15 Units Subcutaneous TID WC  . insulin aspart  0-5 Units Subcutaneous QHS  . losartan  100 mg Oral Daily  . metoprolol tartrate  25 mg Oral BID  . rosuvastatin  20 mg Oral QHS  . sodium chloride flush  3 mL Intravenous Q12H   Continuous Infusions: . sodium chloride 100 mL/hr at 01/11/16 1826    Principal Problem:   Syncope Active Problems:   Type II diabetes mellitus with renal manifestations (HCC)   Essential hypertension, benign   Hyperlipidemia   Mild dementia   CKD (chronic kidney disease), stage III   UTI (urinary tract infection)    Time spent: 25 minutes    Kathie Dike, MD [facility] Pager [phone]. If 7PM-7AM, please contact night-coverage at [email or web address], password [facility] 01/12/2016, 11:00  AM      By signing my name below, I, Rennis Harding, attest that this documentation has been prepared under the direction and in the presence of Kathie Dike, MD. Electronically signed: Rennis Harding, Scribe. 01/12/2016 10:47am   I, Dr. Kathie Dike, personally performed the services described in this documentaiton. All medical record entries made by the scribe were at my direction and in my presence. I have reviewed the chart and agree that the record reflects my personal performance and is accurate and complete  Kathie Dike, MD, 01/12/2016 11:17 AM

## 2016-01-13 DIAGNOSIS — R55 Syncope and collapse: Secondary | ICD-10-CM | POA: Diagnosis not present

## 2016-01-13 DIAGNOSIS — N390 Urinary tract infection, site not specified: Secondary | ICD-10-CM

## 2016-01-13 LAB — BASIC METABOLIC PANEL
Anion gap: 8 (ref 5–15)
BUN: 25 mg/dL — ABNORMAL HIGH (ref 6–20)
CALCIUM: 8.2 mg/dL — AB (ref 8.9–10.3)
CO2: 25 mmol/L (ref 22–32)
CREATININE: 1.29 mg/dL — AB (ref 0.44–1.00)
Chloride: 111 mmol/L (ref 101–111)
GFR calc Af Amer: 43 mL/min — ABNORMAL LOW (ref >60)
GFR calc non Af Amer: 37 mL/min — ABNORMAL LOW (ref >60)
Glucose, Bld: 87 mg/dL (ref 65–99)
Potassium: 3.7 mmol/L (ref 3.5–5.1)
Sodium: 144 mmol/L (ref 135–145)

## 2016-01-13 LAB — CBC
HCT: 29.8 % — ABNORMAL LOW (ref 36.0–46.0)
Hemoglobin: 9.7 g/dL — ABNORMAL LOW (ref 12.0–15.0)
MCH: 27.4 pg (ref 26.0–34.0)
MCHC: 32.6 g/dL (ref 30.0–36.0)
MCV: 84.2 fL (ref 78.0–100.0)
PLATELETS: 186 10*3/uL (ref 150–400)
RBC: 3.54 MIL/uL — AB (ref 3.87–5.11)
RDW: 13.3 % (ref 11.5–15.5)
WBC: 6.2 10*3/uL (ref 4.0–10.5)

## 2016-01-13 LAB — URINE CULTURE: Culture: >=100000 — AB

## 2016-01-13 LAB — GLUCOSE, CAPILLARY
GLUCOSE-CAPILLARY: 80 mg/dL (ref 65–99)
Glucose-Capillary: 109 mg/dL — ABNORMAL HIGH (ref 65–99)

## 2016-01-13 MED ORDER — CEFUROXIME AXETIL 250 MG PO TABS: 500.0000 mg | ORAL_TABLET | Freq: Every day | ORAL | Status: DC

## 2016-01-13 MED ORDER — CEFUROXIME AXETIL 500 MG PO TABS: 500.0000 mg | ORAL_TABLET | Freq: Every day | ORAL | Status: DC

## 2016-01-13 MED ORDER — LOSARTAN POTASSIUM 100 MG PO TABS: 100.0000 mg | ORAL_TABLET | Freq: Every day | ORAL | Status: DC

## 2016-01-13 NOTE — Progress Notes (Signed)
Received call from Central telemetry, patient had 5 beat run of V-tach, found patient attempting to get out of bed, calmed patient, returned to NSR per central telemetry.  Pulling telemetry leads off, continues to removed IV access, Dr. Sarajane Jews notified, son in room.

## 2016-01-13 NOTE — Progress Notes (Signed)
PROGRESS NOTE  Betty [name] [id number] DOB: 07-29-1931 DOA: 01/11/2016 PCP: Vic Blackbird, MD  Summary: 60 yof PMH of DM type 2, HTN, and dementia presented with syncopal episode by history. No prodromal symptoms. No reported seizure-like activity. After waking up the patient had an episode of vomiting and a bowel movement. No confusion or postictal period noted. History was somewhat limited by dementia. Admitted for further evaluation of loss of consciousness. Dehydration was suspected and hydrochlorothiazide was discontinued. Although no definite history to suggest seizure, EEG was performed. Urinalysis suggested possible infection she was started on antibiotics.  Assessment/Plan: 1. Syncope. Etiology unclear but suspect vasovagal, also need to consider Aricept; telemetry discontinued, EKG sinus rhythm, CT head no acute disease, echocardiogram reassuring, EEG pending. Vasovagal episode suspected precipitated by dehydration. Orthostatics were equivocal for heart rate. No history to suggest seizure episode. Vomiting after an episode of benign syncope is not uncommon.  2. Hypertension. Stable. HCTZ was held in the setting of dehydration.  3. CKD stage III. Stable. Creatinine appears to be her baseline.  4. Anemia of chronic kidney disease. Stable. 5. Diabetes. Stable. Continued on glipizide and SSI. A1C 8.3 6. Dementia with acute behavioral disturbance. Would consider discontinuing Aricept dose, certainly if has another episode of syncope. 7. UTI. UC with Group B strep. Started on IV ceftriaxone. Patient has removed multiple IVs.  8. Aortic atherosclerotic vascular disease. 9. Nonsustained V. tach. No structural heart disease. Potassium normal. TSH within normal limits. Continue metoprolol. No further evaluation suggested.   Doing well, ambulating without difficulty. After further discussion with her son suspect vasovagal episode, however she did have an episode last year as well both  from the context of UTI so that may be the precipitating etiology here. We discussed the possibility of arrhythmia, think this is less likely, she has no structural heart disease and she is already on a beta blocker. We discussed outpatient cardiac monitoring but he declined this option which I agree with at this point. We also discussed Aricept which can precipitate syncope and dysrhythmias and I suggested discontinuing this medication. Further discussion with PCP on this point.   Change to oral antibiotics  Discontinue HCTZ and start Losartan.   Follow up EEG as an outpatient  Consider follow-up with outpatient neurology and cardiology as clinically indicated; defer to PCP   Murray Hodgkins, MD  [facility] Direct contact:  --[place]  --[email or web address]; password [email or web address] and click  [phone] contact night coverage as above 01/13/2016, 8:27 AM    Consultants:  None  Procedures:  Echo Study Conclusions  - Left ventricle: The cavity size was normal. Wall thickness was  increased in a pattern of mild LVH. Systolic function was  vigorous. The estimated ejection fraction was in the range of 65%  to 70%. Wall motion was normal; there were no regional wall  motion abnormalities. Doppler parameters are consistent with  abnormal left ventricular relaxation (grade 1 diastolic  dysfunction). - Aortic valve: Moderately calcified annulus. Trileaflet;  moderately thickened leaflets. Valve area (VTI): 2.5 cm^2. Valve  area (Vmax): 2.3 cm^2. - Mitral valve: Mildly calcified annulus. Normal thickness leaflets  . - Atrial septum: No defect or patent foramen ovale was identified. - Pulmonary arteries: PA peak pressure: 31 mm Hg (S). PASP is  borderline elevated. - Technically adequate study.  Antibiotics:  Rocephin 4/10>>4/12  HPI/Subjective: Several episodes of confusion throughout the night per RN documentation. Has removed multiple IVs.  Feeling fine. Did not  eat well today. Overall  doing well today. No reports of chest pain, shortness of breath, n/v/d, or abd pain. She denies pain or burning when urinating.   Son bedside reports intermittent confusion. Per son, the fall was witnessed. She was in the bathroom and the family heard a loud thud and found her on the floor. When she came to it was reported that she was vomited. She has a history of confusion and multiple falls.   Objective: Filed Vitals:   01/12/16 1507 01/12/16 1938 01/12/16 2100 01/13/16 0500  BP: 125/46  174/62 157/52  Pulse: 68  80 60  Temp: 98.1 F (36.7 C)  98.4 F (36.9 C) 98 F (36.7 C)  TempSrc:   Oral Oral  Resp: 18  18 18   Height:      Weight:    58.06 kg (128 lb)  SpO2: 100% 98% 97% 97%    Intake/Output Summary (Last 24 hours) at 01/13/16 0827 Last data filed at 01/13/16 0600  Gross per 24 hour  Intake   1923 ml  Output    875 ml  Net   1048 ml     Filed Weights   01/11/16 1040 01/12/16 0400 01/13/16 0500  Weight: 58.8 kg (129 lb 10.1 oz) 58.4 kg (128 lb 12 oz) 58.06 kg (128 lb)    Exam:  Afebrile, VSS, No hypoxia.  General:  Appears calm and comfortable Cardiovascular: RRR, no m/r/g. No LE edema. Respiratory: CTA bilaterally, no w/r/r. Normal respiratory effort. Abdomen: soft, ntnd Musculoskeletal: grossly normal tone BUE/BLE Psychiatric: grossly normal mood and affect, speech fluent and appropriate Neurologic: grossly non-focal.  New data reviewed:  Creatinine improved 1.29, BUN 25  Hgb stable.   Scheduled Meds: . cefTRIAXone (ROCEPHIN)  IV  1 g Intravenous Q24H  . donepezil  10 mg Oral QHS  . enoxaparin (LOVENOX) injection  30 mg Subcutaneous Q24H  . glipiZIDE  5 mg Oral BID AC  . insulin aspart  0-15 Units Subcutaneous TID WC  . insulin aspart  0-5 Units Subcutaneous QHS  . losartan  100 mg Oral Daily  . metoprolol tartrate  25 mg Oral BID  . rosuvastatin  20 mg Oral QHS  . sodium chloride flush  3 mL Intravenous Q12H   Continuous  Infusions: . sodium chloride 100 mL/hr at 01/12/16 2132    Principal Problem:   Syncope Active Problems:   Type II diabetes mellitus with renal manifestations ([place])   Essential hypertension, benign   Hyperlipidemia   Mild dementia   CKD (chronic kidney disease), stage III   UTI (urinary tract infection)    By signing my name below, I, Rennis Harding attest that this documentation has been prepared under the direction and in the presence of Murray Hodgkins, MD Electronically signed: Rennis Harding  01/13/2016 2:26pm  I personally performed the services described in this documentation. All medical record entries made by the scribe were at my direction. I have reviewed the chart and agree that the record reflects my personal performance and is accurate and complete. Murray Hodgkins, MD

## 2016-01-13 NOTE — Discharge Summary (Signed)
Physician Discharge Summary  Betty [name] [id number] DOB: 1930/12/18 DOA: 01/11/2016  PCP: Vic Blackbird, MD  Admit date: 01/11/2016 Discharge date: 01/13/2016  Recommendations for Outpatient Follow-up:   Follow up with PCP in one week, follow-up hospitalization, see below  Would suggest discontinuing Aricept given her second episode of syncope  Hydrochlorothiazide discontinued, suspected to have caused dehydration  EEG results are pending at this time  Could consider outpatient cardiology neurology follow-up, however at this time conservative management appears appropriate. Defer to PCP.  Follow-up Information    Follow up with Vic Blackbird, MD. Schedule an appointment as soon as possible for a visit in 1 week.   Specialty:  Family Medicine   Contact information:   [address] [place] [address] [phone]      Discharge Diagnoses:  1. Syncope  2. DM Type 2 3. Essential HTN 4. Mild dementia 5. CKD Stage III 6. UTI 7. Aortic atherosclerotic vascular disease  Discharge Condition: Improved Disposition: Discharge home  Diet recommendation: Heart healthy, carb modified  Filed Weights   01/11/16 1040 01/12/16 0400 01/13/16 0500  Weight: 58.8 kg (129 lb 10.1 oz) 58.4 kg (128 lb 12 oz) 58.06 kg (128 lb)    History of present illness:  82 yof PMH of DM type 2, HTN, and dementia presented with syncopal episode by history. No prodromal symptoms. No reported seizure-like activity. After waking up the patient had an episode of vomiting and a bowel movement. No confusion or postictal period noted. History was somewhat limited by dementia.   Hospital Course:  Admitted, treated with IV fluids with improvement. No recurrent syncope. After further discussion with son, vasovagal etiology suspected, dehydration. Consideration also given to Aricept as below. CT head was unremarkable and EEG is pending at this time but there is no history to suggest seizure.  Hospitalization was uncomplicated and she is stable for discharge. Individual issues as below.  1. Syncope. Etiology unclear but suspect vasovagal, also need to consider Aricept; telemetry discontinued, EKG sinus rhythm, CT head no acute disease, echocardiogram reassuring, EEG pending. Vasovagal episode suspected precipitated by dehydration. Orthostatics were equivocal for heart rate. No history to suggest seizure episode. Vomiting after an episode of benign syncope is not uncommon.  2. Hypertension. Stable. HCTZ was held in the setting of dehydration.  3. CKD stage III. Stable. Creatinine appears to be her baseline.  4. Anemia of chronic kidney disease. Stable. 5. Diabetes. Stable. Continued on glipizide and SSI. A1C 8.3 6. Dementia with acute behavioral disturbance. Would consider discontinuing Aricept dose, certainly if has another episode of syncope. 7. UTI. UC with Group B strep. Started on IV ceftriaxone. Patient has removed multiple IVs.  8. Aortic atherosclerotic vascular disease. 9. Nonsustained V. tach. No structural heart disease. Potassium normal. TSH within normal limits. Continue metoprolol. No further evaluation suggested. Doing well, ambulating without difficulty. After further discussion with her son suspect vasovagal episode, however she did have an episode last year as well both from the context of UTI so that may be the precipitating etiology here. We discussed the possibility of arrhythmia, think this is less likely, she has no structural heart disease and she is already on a beta blocker. We discussed outpatient cardiac monitoring but he declined this option which I agree with at this point. We also discussed Aricept which can precipitate syncope and dysrhythmias and I suggested discontinuing this medication. Further discussion with PCP on this point.  Follow up EEG as an outpatient  Consider follow-up  with outpatient neurology and cardiology as clinically indicated; defer to  PCP  Consultants:  None  Procedures:  None   Discharge Instructions  Discharge Instructions    Diet Carb Modified    Complete by:  As directed      Discharge instructions    Complete by:  As directed   Call your physician or seek immediate medical attention for falls, unsteadiness on feet, passing out, worsening confusion or worsening of condition.     Walk with assistance    Complete by:  As directed           Discharge Medication List as of 01/13/2016  3:17 PM    START taking these medications   Details  cefUROXime (CEFTIN) 500 MG tablet Take 1 tablet (500 mg total) by mouth daily with supper., Starting 01/13/2016, Until Discontinued, Normal    losartan (COZAAR) 100 MG tablet Take 1 tablet (100 mg total) by mouth daily., Starting 01/13/2016, Until Discontinued, Normal      CONTINUE these medications which have NOT CHANGED   Details  ALPRAZolam (XANAX) 0.25 MG tablet Take 1 tablet (0.25 mg total) by mouth at bedtime as needed for anxiety or sleep., Starting 01/04/2016, Until Discontinued, Phone In    donepezil (ARICEPT) 10 MG tablet take 1 tablet by mouth at bedtime for memory, Normal    glipiZIDE (GLUCOTROL) 5 MG tablet Take 1 tablet (5 mg total) by mouth 2 (two) times daily before a meal., Starting 01/06/2016, Until Discontinued, Normal    glucose blood test strip Use as instructed for once daily testing  With Easymax meter, Print    metoprolol tartrate (LOPRESSOR) 25 MG tablet take 1 tablet by mouth twice a day with meals  FOR BLOOD PRESSURE, Normal    rosuvastatin (CRESTOR) 20 MG tablet Take 1 tablet (20 mg total) by mouth at bedtime. FOR CHOLESTEROL, Starting 01/04/2016, Until Discontinued, Normal      STOP taking these medications     losartan-hydrochlorothiazide (HYZAAR) 100-25 MG tablet        No Known Allergies  The results of significant diagnostics from this hospitalization (including imaging, microbiology, ancillary and laboratory) are listed below for  reference.    Significant Diagnostic Studies: Dg Chest 2 View  01/11/2016  CLINICAL DATA:  Syncope EXAM: CHEST  2 VIEW COMPARISON:  10/10/2015 FINDINGS: The heart size is normal. There is aortic atherosclerosis. No pleural effusion or edema. No airspace consolidation noted. IMPRESSION: 1. No acute cardiopulmonary abnormalities. 2. Aortic atherosclerosis. Electronically Signed   By: Kerby Moors M.D.   On: 01/11/2016 08:40   Dg Lumbar Spine Complete  01/11/2016  CLINICAL DATA:  Fall.  Initial evaluation. EXAM: LUMBAR SPINE - COMPLETE 4+ VIEW COMPARISON:  CT 08/06/2010 FINDINGS: Scoliosis concave right. Prior L4-L5 posterior and interbody fusion. No acute abnormality identified. No evidence of fracture. Aortoiliac atherosclerotic vascular calcification. IMPRESSION: 1. Mild scoliosis concave right. L4-L5 posterior interbody fusion. Diffuse osteopenia. No acute bony abnormality. No evidence of fracture. 2. Aortoiliac atherosclerotic vascular disease . Electronically Signed   By: Marcello Moores  Register   On: 01/11/2016 08:32   Ct Head Wo Contrast  01/11/2016  CLINICAL DATA:  82 year old hypertensive diabetic female with dementia presenting with syncopal episode with loss of consciousness hitting posterior aspect of head yesterday. Initial encounter. EXAM: CT HEAD WITHOUT CONTRAST TECHNIQUE: Contiguous axial images were obtained from the base of the skull through the vertex without intravenous contrast. COMPARISON:  10/10/2015 and 05/11/2009 CT. FINDINGS: No skull fracture or intracranial hemorrhage. Mild global  atrophy without hydrocephalus. Mild chronic small vessel disease changes without CT evidence of large acute infarct. Vascular calcifications. No intracranial mass lesion noted on this unenhanced exam. Orbital structures unremarkable. Mastoid air cells, middle ear cavities and paranasal sinuses which are visualized are clear. IMPRESSION: No skull fracture or intracranial hemorrhage. No CT evidence of large  acute infarct. Electronically Signed   By: Genia Del M.D.   On: 01/11/2016 07:18   Dg Hips Bilat With Pelvis 3-4 Views  01/11/2016  CLINICAL DATA:  Fall. EXAM: DG HIP (WITH OR WITHOUT PELVIS) 3-4V BILAT COMPARISON:  No prior. FINDINGS: L4-L5 fusion. Diffuse osteopenia and degenerative change. No acute bony or joint abnormality identified. No evidence of fracture. Peripheral vascular calcification . IMPRESSION: 1. Diffuse osteopenia degenerative change. Prior L4-L5 fusion. No acute bony abnormality. 2. Peripheral vascular disease. Electronically Signed   By: Marcello Moores  Register   On: 01/11/2016 08:34    Microbiology: Recent Results (from the past 240 hour(s))  Culture, Urine     Status: Abnormal   Collection Time: 01/11/16  6:13 PM  Result Value Ref Range Status   Specimen Description URINE, CLEAN CATCH  Final   Special Requests NONE  Final   Culture (A)  Final    >=100,000 COLONIES/mL GROUP B STREP(S.AGALACTIAE)ISOLATED TESTING AGAINST S. AGALACTIAE NOT ROUTINELY PERFORMED DUE TO PREDICTABILITY OF AMP/PEN/VAN SUSCEPTIBILITY. Performed at [facility]    Report Status 01/13/2016 FINAL  Final     Labs: Basic Metabolic Panel:  Recent Labs Lab 01/11/16 0650 01/12/16 0500 01/13/16 0522  NA 140 142 144  K 4.1 3.7 3.7  CL 105 107 111  CO2 27 25 25   GLUCOSE 235* 109* 87  BUN 27* 26* 25*  CREATININE 1.31* 1.40* 1.29*  CALCIUM 8.9 8.4* 8.2*   Liver Function Tests:  Recent Labs Lab 01/11/16 0650  AST 18  ALT 13*  ALKPHOS 57  BILITOT 0.5  PROT 6.6  ALBUMIN 3.6   CBC:  Recent Labs Lab 01/11/16 0650 01/12/16 0500 01/13/16 0522  WBC 14.7* 9.6 6.2  NEUTROABS 11.8*  --   --   HGB 10.5* 9.5* 9.7*  HCT 32.6* 28.4* 29.8*  MCV 84.2 83.3 84.2  PLT 202 188 186   Cardiac Enzymes:  Recent Labs Lab 01/11/16 0650 01/11/16 1853 01/11/16 2343 01/12/16 0500  TROPONINI <0.03 <0.03 <0.03 <0.03   CBG:  Recent Labs Lab 01/12/16 1135 01/12/16 1639 01/12/16 2053  01/13/16 0750 01/13/16 1133  GLUCAP 238* 97 267* 80 109*    Principal Problem:   Syncope Active Problems:   Type II diabetes mellitus with renal manifestations ([facility])   Essential hypertension, benign   Hyperlipidemia   Mild dementia   CKD (chronic kidney disease), stage III   UTI (urinary tract infection)   Time coordinating discharge: 35 minutes   Signed:  Murray Hodgkins, MD [facility] 01/13/2016, 8:13 PM   By signing my name below, I, Rennis Harding attest that this documentation has been prepared under the direction and in the presence of Murray Hodgkins, MD Electronically signed: Rennis Harding 01/13/2016 2:26pm   I personally performed the services described in this documentation. All medical record entries made by the scribe were at my direction. I have reviewed the chart and agree that the record reflects my personal performance and is accurate and complete. Murray Hodgkins, MD

## 2016-01-13 NOTE — Progress Notes (Signed)
Discharged via wheelchair accompanied by staff for discharge home in care of family.  Stable at discharge.

## 2016-01-13 NOTE — Progress Notes (Signed)
Pt has had several episodes of confusion throughout the night. Pt has been alert and oriented to self, but disoriented to time, situation, and place. She has continuously removed telemetry leads and removed her iv several times. Daughter has stayed the night with pt and pt still continues to display moments of confusion and removal of monitoring equipment as well as treatment equipment. Pt has hx of Dementia.

## 2016-01-13 NOTE — Progress Notes (Signed)
Central telemetry notified of discharge, telemetry removed.  Discharge instructions reviewed with patient and family, questions answered, understanding verbalized.  Family helping patient get dressed.

## 2016-01-13 NOTE — Care Management Note (Signed)
Case Management Note  Patient Details  Name: Betty [name] MRN: [id number] Date of Birth: Aug 15, 1931  Subjective/Objective:      Spoke with patient and family for discharge plan. Patient is from home  with son who is her care giver. She is alert and pleasant and answers questions appropriately.   Son is in the  Room and verifies answers. No issues identified. Patient does not use and DME at home.          Action/Plan: Home with family care.   Expected Discharge Date:                  Expected Discharge Plan:  Home/Self Care  In-House Referral:     Discharge planning Services  CM Consult, NA  Post Acute Care Choice:  NA Choice offered to:     DME Arranged:  N/A DME Agency:  NA  HH Arranged:  NA HH Agency:     Status of Service:  Completed, signed off  Medicare Important Message Given:    Date Medicare IM Given:    Medicare IM give by:    Date Additional Medicare IM Given:    Additional Medicare Important Message give by:     If discussed at [place] of Stay Meetings, dates discussed:    Additional Comments:  Alvie Heidelberg, RN 01/13/2016, 3:30 PM

## 2016-01-13 NOTE — Plan of Care (Signed)
Problem: Education: Goal: Knowledge of [facility] Education information/materials will improve Outcome: Progressing Daughter at bedside and all information has been explained.   Problem: Safety: Goal: Ability to remain free from injury will improve Outcome: Progressing Pt a high fall risk. Pt bed exit on, family is sitting with pt, etc.

## 2016-01-13 NOTE — Progress Notes (Signed)
Inpatient Diabetes Program Recommendations  AACE/ADA: New Consensus Statement on Inpatient Glycemic Control (2015)  Target Ranges:  Prepandial:   less than 140 mg/dL      Peak postprandial:   less than 180 mg/dL (1-2 hours)      Critically ill patients:  140 - 180 mg/dL   Review of Glycemic Control Results for SANJANA, CLIFT (MRN [id number]) as of 01/13/2016 10:37  Ref. Range 01/12/2016 07:43 01/12/2016 11:35 01/12/2016 16:39 01/12/2016 20:53 01/13/2016 07:50  Glucose-Capillary Latest Ref Range: 65-99 mg/dL 100 (H) 238 (H) 97 267 (H) 80   Inpatient Diabetes Program Recommendations:  Please consider Novolog 3 units ac meal coverage tid and discontinuing Glucotrol while in the hospital.  Thank you, Nani Gasser. Courtlynn Holloman, RN, MSN, CDE Inpatient Glycemic Control Team Team Pager [phone] (8am-5pm) 01/13/2016 10:38 AM

## 2016-01-15 ENCOUNTER — Telehealth: Payer: Self-pay | Admitting: Family Medicine

## 2016-01-15 MED ORDER — CEPHALEXIN 250 MG PO CAPS: 250.0000 mg | ORAL_CAPSULE | Freq: Two times a day (BID) | ORAL | Status: DC

## 2016-01-15 NOTE — Telephone Encounter (Signed)
Send in keflex 250mg  po BID for 3 days

## 2016-01-15 NOTE — Telephone Encounter (Signed)
Son called and made aware of Rx

## 2016-01-15 NOTE — Telephone Encounter (Signed)
Mother out of Hospital.  They told her to take antibiotic for UTI but did not send order in.  Please advise.  [place] f/u appt made

## 2016-01-26 NOTE — Procedures (Signed)
  [place] A. Merlene Laughter, MD     [email or web address]           HISTORY: The patient 82 year old who was transferred episode of syncope suspicious for seizure.  MEDICATIONS: Scheduled Meds: Continuous Infusions: PRN Meds:.  Prior to Admission medications   Medication Sig Start Date End Date Taking? Authorizing Provider  ALPRAZolam (XANAX) 0.25 MG tablet Take 1 tablet (0.25 mg total) by mouth at bedtime as needed for anxiety or sleep. 01/04/16   Alycia Rossetti, MD  cefUROXime (CEFTIN) 500 MG tablet Take 1 tablet (500 mg total) by mouth daily with supper. 01/13/16   Samuella Cota, MD  cephALEXin (KEFLEX) 250 MG capsule Take 1 capsule (250 mg total) by mouth 2 (two) times daily. 01/15/16   Alycia Rossetti, MD  donepezil (ARICEPT) 10 MG tablet take 1 tablet by mouth at bedtime for memory 01/04/16   Alycia Rossetti, MD  glipiZIDE (GLUCOTROL) 5 MG tablet Take 1 tablet (5 mg total) by mouth 2 (two) times daily before a meal. 01/06/16   Alycia Rossetti, MD  glucose blood test strip Use as instructed for once daily testing  With Easymax meter 12/31/12   Alycia Rossetti, MD  losartan (COZAAR) 100 MG tablet Take 1 tablet (100 mg total) by mouth daily. 01/13/16   Samuella Cota, MD  metoprolol tartrate (LOPRESSOR) 25 MG tablet take 1 tablet by mouth twice a day with meals  FOR BLOOD PRESSURE 01/04/16   Alycia Rossetti, MD  rosuvastatin (CRESTOR) 20 MG tablet Take 1 tablet (20 mg total) by mouth at bedtime. FOR CHOLESTEROL 01/04/16   Alycia Rossetti, MD      ANALYSIS: A 16 channel recording using standard 10 20 measurements is conducted for 21 minutes. There is a well-formed posterior dominant rhythm of 8.5 Hz which attenuates with eye opening. Awake and drowsy activities are observed. Beta activity observed in the frontal areas. Photic simulation and hyperventilation are not carried out. There is no focal or lateralized slowing. There is no epileptiform activities  observed.   IMPRESSION: This is a normal recording of the awake and drowsy states.      Anysha Frappier A. Merlene Laughter, M.D.  Diplomate, Tax adviser of Psychiatry and Neurology ( Neurology).

## 2016-01-27 ENCOUNTER — Ambulatory Visit (INDEPENDENT_AMBULATORY_CARE_PROVIDER_SITE_OTHER): Payer: Medicare HMO | Admitting: Family Medicine

## 2016-01-27 ENCOUNTER — Encounter: Payer: Self-pay | Admitting: Family Medicine

## 2016-01-27 VITALS — BP 148/70 | HR 88 | Temp 98.3°F | Resp 16 | Ht 61.0 in | Wt 130.0 lb

## 2016-01-27 DIAGNOSIS — R55 Syncope and collapse: Secondary | ICD-10-CM | POA: Diagnosis not present

## 2016-01-27 DIAGNOSIS — N390 Urinary tract infection, site not specified: Secondary | ICD-10-CM

## 2016-01-27 DIAGNOSIS — R69 Illness, unspecified: Secondary | ICD-10-CM | POA: Diagnosis not present

## 2016-01-27 DIAGNOSIS — I10 Essential (primary) hypertension: Secondary | ICD-10-CM | POA: Diagnosis not present

## 2016-01-27 DIAGNOSIS — F0390 Unspecified dementia without behavioral disturbance: Secondary | ICD-10-CM

## 2016-01-27 DIAGNOSIS — F03A0 Unspecified dementia, mild, without behavioral disturbance, psychotic disturbance, mood disturbance, and anxiety: Secondary | ICD-10-CM

## 2016-01-27 NOTE — Progress Notes (Signed)
Patient ID: Betty [name], female   DOB: September 24, 1931, 82 y.o.   MRN: [id number]    Subjective:    Patient ID: Betty [name], female    DOB: Mar 01, 1931, 82 y.o.   MRN: [id number]  Patient presents for Hospital F/U Patient here for hospital follow-up. She was admitted to the hospital on April 10 after syncopal event. The specifics are little confusing. Suspected that maybe she was dehydrated and she had a vasovagal episode. There was no evidence of any seizure activity however she did have an EEG done by neurology which was negative. She has CT scan which was unremarkable. Her telemetry was normal. Her blood pressure was stable renal function was stable her blood sugar was stable. He is also treated for urinary tract infection she was initially started on IV ceftriaxone however the note states that she removed several IVs. When she was discharged home they did not provide her with an antibiotic therefore prescribe Keflex 250 mg twice a day for 3 days. There was discussion of possibly discontinuing the Aricept that she has had 2 other episodes although the previous episode she also had a urinary tract infection.  Today she feel well no concerns   On review of her chart back in March 2012 she had 2 syncopal episodes. He was question whether she truly lost consciousness. At that time she was being evaluated for possible hypothyroidism as well. Her blood pressures were also elevated. She had a rule out done for myocardial infarction she had consultation with cardiology who recommended an event monitor that time. She was placed back on her regular home medications. She was back at her baseline. No further workup was done  Note aricept was started in  2014 Review Of Systems:  GEN- denies fatigue, fever, weight loss,weakness, recent illness HEENT- denies eye drainage, change in vision, nasal discharge, CVS- denies chest pain, palpitations RESP- denies SOB, cough, wheeze ABD- denies N/V, change  in stools, abd pain GU- denies dysuria, hematuria, dribbling, incontinence MSK- denies joint pain, muscle aches, injury Neuro- denies headache, dizziness, syncope, seizure activity       Objective:    BP 148/70 mmHg  Pulse 88  Temp(Src) 98.3 F (36.8 C) (Oral)  Resp 16  Ht 5\' 1"  (1.549 m)  Wt 130 lb (58.968 kg)  BMI 24.58 kg/m2 GEN- NAD, alert and oriented x3 HEENT- PERRL, EOMI, non injected sclera, pink conjunctiva, MMM, oropharynx clear Neck- Supple, no thyromegaly, no bruit  CVS- RRR, 2/6  murmur RESP-CTAB ABD-NABS,soft,NT,ND Neuro- CNII-XII intact, no deficits  EXT- No edema Pulses- Radial, DP- 2+        Assessment & Plan:      Problem List Items Addressed This Visit    None      Note: This dictation was prepared with Dragon dictation along with smaller phrase technology. Any transcriptional errors that result from this process are unintentional.

## 2016-01-27 NOTE — Patient Instructions (Addendum)
Restart Aricept  Continue Losartan  Dr. Sampson Goon for Neurology appointment to be scheduled  PCS form for aide Bring me [name] form  F/U keep appt for May

## 2016-01-28 ENCOUNTER — Encounter: Payer: Self-pay | Admitting: Family Medicine

## 2016-01-28 NOTE — Assessment & Plan Note (Signed)
?   This was more of dehydration, agree with D/C of HCTZ Nothing cardiac found in past, with regards to Aricept no prologed PT seen, she had episodes before she was put on this medication Family prefers her on medication with regards to her mood. She did have sundowning in the hospital, and does not do well out of her element Will pose question to neurology as well about Aricept EEG was normal, though no seizure activity found  WIll keep continue losartan, possible vasovagal, she had last 2 syncopal events witinessed by daughter and she was in normal state of health before she passed out.

## 2016-01-28 NOTE — Assessment & Plan Note (Signed)
Will recheck urine culture, no symptoms

## 2016-01-28 NOTE — Assessment & Plan Note (Signed)
BP will be a little elevated without diuretic Family pushing her to drink more

## 2016-01-29 LAB — URINE CULTURE
COLONY COUNT: NO GROWTH
ORGANISM ID, BACTERIA: NO GROWTH

## 2016-02-01 DIAGNOSIS — R69 Illness, unspecified: Secondary | ICD-10-CM | POA: Diagnosis not present

## 2016-02-01 DIAGNOSIS — I10 Essential (primary) hypertension: Secondary | ICD-10-CM | POA: Diagnosis not present

## 2016-02-01 DIAGNOSIS — E1121 Type 2 diabetes mellitus with diabetic nephropathy: Secondary | ICD-10-CM | POA: Diagnosis not present

## 2016-02-01 DIAGNOSIS — R296 Repeated falls: Secondary | ICD-10-CM | POA: Diagnosis not present

## 2016-02-10 ENCOUNTER — Other Ambulatory Visit: Payer: Self-pay | Admitting: *Deleted

## 2016-02-10 MED ORDER — LOSARTAN POTASSIUM 100 MG PO TABS: 100.0000 mg | ORAL_TABLET | Freq: Every day | ORAL | Status: DC

## 2016-02-10 NOTE — Telephone Encounter (Signed)
Received fax requesting refill on Losartan.   Refill appropriate and filled per protocol. 

## 2016-02-15 ENCOUNTER — Ambulatory Visit: Payer: Medicare HMO | Admitting: Family Medicine

## 2016-02-19 ENCOUNTER — Ambulatory Visit (INDEPENDENT_AMBULATORY_CARE_PROVIDER_SITE_OTHER): Payer: Medicare HMO | Admitting: Family Medicine

## 2016-02-19 ENCOUNTER — Encounter: Payer: Self-pay | Admitting: Family Medicine

## 2016-02-19 VITALS — BP 148/78 | HR 62 | Temp 98.7°F | Resp 14 | Ht 61.0 in | Wt 132.0 lb

## 2016-02-19 DIAGNOSIS — F0390 Unspecified dementia without behavioral disturbance: Secondary | ICD-10-CM

## 2016-02-19 DIAGNOSIS — N183 Chronic kidney disease, stage 3 unspecified: Secondary | ICD-10-CM

## 2016-02-19 DIAGNOSIS — I10 Essential (primary) hypertension: Secondary | ICD-10-CM

## 2016-02-19 DIAGNOSIS — E1122 Type 2 diabetes mellitus with diabetic chronic kidney disease: Secondary | ICD-10-CM

## 2016-02-19 DIAGNOSIS — R69 Illness, unspecified: Secondary | ICD-10-CM | POA: Diagnosis not present

## 2016-02-19 DIAGNOSIS — F03A0 Unspecified dementia, mild, without behavioral disturbance, psychotic disturbance, mood disturbance, and anxiety: Secondary | ICD-10-CM

## 2016-02-19 MED ORDER — HYDROCHLOROTHIAZIDE 12.5 MG PO TABS: 12.5000 mg | ORAL_TABLET | Freq: Every day | ORAL | Status: DC

## 2016-02-19 NOTE — Patient Instructions (Signed)
Restarting HCTZ at 12.5mg  once a day  Keep checking CBG  F/U 2 months

## 2016-02-19 NOTE — Assessment & Plan Note (Signed)
Continue with Aricept 

## 2016-02-19 NOTE — Assessment & Plan Note (Addendum)
Her blood pressure is starting to increase with the hydrochlorothiazide off.  restart her HCTZ 12.5 mg continue the losartan and metoprolol. She's also started some mild swelling at her ankles are being off of her diuretic

## 2016-02-19 NOTE — Assessment & Plan Note (Signed)
Blood sugars improving continue glipizide 5 mg twice a day

## 2016-02-19 NOTE — Progress Notes (Signed)
Patient ID: Betty [name], female   DOB: [birth year], 82 y.o.   MRN: [id number]    Subjective:    Patient ID: Betty [name], female    DOB: 07-12-1931, 82 y.o.   MRN: [id number]  Patient presents for 6 week F/U Patient here for interim follow-up for syncopal episode required overnight admission. See previous note from 4/26. I restarted Aricept this was also discussed with the neurologist this is not thought to be  causing her syncopal episodes. She had normal neurological workup otherwise. His is also thought to be less likely cardiac at this time. Her hydrochlorothiazide was discontinued as she was little dehydrated as well. She was also treated for urinary tract infection in the setting of syncopal event. There was also mention of some possible peripheral neuropathy due to her diabetes that could be causing some balance disturbance.  Today she is here for follow-up on her blood pressure medications.Her blood pressure has been increasing his been running upwards of 180 -190 at the neurologist office  Diabetes mellitus her last A1c was 8.3% last month increasing glipizide to 5 mg twice a day, no meter today states CBG look good   Review Of Systems:  GEN- denies fatigue, fever, weight loss,weakness, recent illness HEENT- denies eye drainage, change in vision, nasal discharge, CVS- denies chest pain, palpitations RESP- denies SOB, cough, wheeze ABD- denies N/V, change in stools, abd pain GU- denies dysuria, hematuria, dribbling, incontinence MSK- denies joint pain, muscle aches, injury Neuro- denies headache, dizziness, syncope, seizure activity       Objective:    BP 148/78 mmHg  Pulse 62  Temp(Src) 98.7 F (37.1 C) (Oral)  Resp 14  Ht 5\' 1"  (1.549 m)  Wt 132 lb (59.875 kg)  BMI 24.95 kg/m2 GEN- NAD, alert and oriented x3 CVS- RRR, 2/6  murmur RESP-CTAB ABD-NABS,soft,NT,ND EXT- trace pedal edema Pulses- Radial, DP- 2+     Assessment & Plan:      Problem List  Items Addressed This Visit    Type II diabetes mellitus with renal manifestations (HCC)    Blood sugars improving continue glipizide 5 mg twice a day      Mild dementia    Continue with Aricept      Essential hypertension, benign - Primary    Her blood pressure is starting to increase with the hydrochlorothiazide off.  restart her HCTZ 12.5 mg continue the losartan and metoprolol. She's also started some mild swelling at her ankles are being off of her diuretic      Relevant Medications   hydrochlorothiazide (HYDRODIURIL) 12.5 MG tablet      Note: This dictation was prepared with Dragon dictation along with smaller phrase technology. Any transcriptional errors that result from this process are unintentional.

## 2016-04-25 ENCOUNTER — Ambulatory Visit: Payer: Medicare HMO | Admitting: Family Medicine

## 2016-05-28 ENCOUNTER — Other Ambulatory Visit: Payer: Self-pay | Admitting: Family Medicine

## 2016-06-24 ENCOUNTER — Telehealth: Payer: Self-pay | Admitting: Family Medicine

## 2016-06-24 MED ORDER — TRAZODONE HCL 50 MG PO TABS: 25.0000 mg | ORAL_TABLET | Freq: Every day | ORAL | 3 refills | Status: DC

## 2016-06-24 NOTE — Telephone Encounter (Signed)
[phone]  Tammy, patients daughter calling to say the xanax is not helping Betty [name] sleep at al, is there anything else she can take to help her sleep?

## 2016-06-24 NOTE — Telephone Encounter (Signed)
Trazodone 25mg  at bedtime

## 2016-06-24 NOTE — Telephone Encounter (Signed)
MD please advise

## 2016-06-24 NOTE — Telephone Encounter (Signed)
Call placed to patient and patient daughter Lynelle Smoke, made aware.

## 2016-08-05 ENCOUNTER — Encounter: Payer: Self-pay | Admitting: Family Medicine

## 2016-08-05 ENCOUNTER — Ambulatory Visit (INDEPENDENT_AMBULATORY_CARE_PROVIDER_SITE_OTHER): Payer: Medicare HMO | Admitting: Family Medicine

## 2016-08-05 VITALS — BP 134/70 | HR 56 | Temp 98.2°F | Resp 14 | Ht 61.0 in | Wt 127.0 lb

## 2016-08-05 DIAGNOSIS — N183 Chronic kidney disease, stage 3 unspecified: Secondary | ICD-10-CM

## 2016-08-05 DIAGNOSIS — Z23 Encounter for immunization: Secondary | ICD-10-CM | POA: Diagnosis not present

## 2016-08-05 DIAGNOSIS — E782 Mixed hyperlipidemia: Secondary | ICD-10-CM

## 2016-08-05 DIAGNOSIS — E1122 Type 2 diabetes mellitus with diabetic chronic kidney disease: Secondary | ICD-10-CM | POA: Diagnosis not present

## 2016-08-05 DIAGNOSIS — F5104 Psychophysiologic insomnia: Secondary | ICD-10-CM | POA: Diagnosis not present

## 2016-08-05 DIAGNOSIS — F0390 Unspecified dementia without behavioral disturbance: Secondary | ICD-10-CM | POA: Diagnosis not present

## 2016-08-05 DIAGNOSIS — I10 Essential (primary) hypertension: Secondary | ICD-10-CM | POA: Diagnosis not present

## 2016-08-05 DIAGNOSIS — F03A0 Unspecified dementia, mild, without behavioral disturbance, psychotic disturbance, mood disturbance, and anxiety: Secondary | ICD-10-CM

## 2016-08-05 DIAGNOSIS — R69 Illness, unspecified: Secondary | ICD-10-CM | POA: Diagnosis not present

## 2016-08-05 LAB — COMPREHENSIVE METABOLIC PANEL
ALT: 16 U/L (ref 6–29)
AST: 20 U/L (ref 10–35)
Albumin: 4.1 g/dL (ref 3.6–5.1)
Alkaline Phosphatase: 54 U/L (ref 33–130)
BILIRUBIN TOTAL: 0.5 mg/dL (ref 0.2–1.2)
BUN: 29 mg/dL — AB (ref 7–25)
CALCIUM: 9.3 mg/dL (ref 8.6–10.4)
CO2: 26 mmol/L (ref 20–31)
Chloride: 106 mmol/L (ref 98–110)
Creat: 1.5 mg/dL — ABNORMAL HIGH (ref 0.60–0.88)
GLUCOSE: 140 mg/dL — AB (ref 70–99)
Potassium: 4.1 mmol/L (ref 3.5–5.3)
SODIUM: 141 mmol/L (ref 135–146)
Total Protein: 6.7 g/dL (ref 6.1–8.1)

## 2016-08-05 LAB — CBC WITH DIFFERENTIAL/PLATELET
BASOS PCT: 0 %
Basophils Absolute: 0 cells/uL (ref 0–200)
EOS PCT: 0 %
Eosinophils Absolute: 0 cells/uL — ABNORMAL LOW (ref 15–500)
HCT: 32.6 % — ABNORMAL LOW (ref 35.0–45.0)
Hemoglobin: 10.5 g/dL — ABNORMAL LOW (ref 12.0–15.0)
LYMPHS PCT: 22 %
Lymphs Abs: 1540 cells/uL (ref 850–3900)
MCH: 26.9 pg — ABNORMAL LOW (ref 27.0–33.0)
MCHC: 32.2 g/dL (ref 32.0–36.0)
MCV: 83.4 fL (ref 80.0–100.0)
MONOS PCT: 8 %
MPV: 10.5 fL (ref 7.5–12.5)
Monocytes Absolute: 560 cells/uL (ref 200–950)
NEUTROS PCT: 70 %
Neutro Abs: 4900 cells/uL (ref 1500–7800)
PLATELETS: 227 10*3/uL (ref 140–400)
RBC: 3.91 MIL/uL (ref 3.80–5.10)
RDW: 14.5 % (ref 11.0–15.0)
WBC: 7 10*3/uL (ref 3.8–10.8)

## 2016-08-05 LAB — HEMOGLOBIN A1C
Hgb A1c MFr Bld: 7.8 % — ABNORMAL HIGH (ref <5.7)
Mean Plasma Glucose: 177 mg/dL

## 2016-08-05 LAB — LIPID PANEL
CHOLESTEROL: 173 mg/dL (ref 125–200)
HDL: 66 mg/dL (ref >=46)
LDL Cholesterol: 89 mg/dL (ref <130)
TRIGLYCERIDES: 88 mg/dL (ref <150)
Total CHOL/HDL Ratio: 2.6 Ratio (ref <=5.0)
VLDL: 18 mg/dL (ref <30)

## 2016-08-05 MED ORDER — HYDROCHLOROTHIAZIDE 12.5 MG PO TABS: 12.5000 mg | ORAL_TABLET | Freq: Every day | ORAL | 6 refills | Status: DC

## 2016-08-05 MED ORDER — ALPRAZOLAM 0.25 MG PO TABS: 0.2500 mg | ORAL_TABLET | Freq: Every evening | ORAL | 3 refills | Status: DC | PRN

## 2016-08-05 MED ORDER — METOPROLOL TARTRATE 25 MG PO TABS: ORAL_TABLET | ORAL | 6 refills | Status: DC

## 2016-08-05 MED ORDER — DONEPEZIL HCL 10 MG PO TABS: ORAL_TABLET | ORAL | 6 refills | Status: DC

## 2016-08-05 MED ORDER — ROSUVASTATIN CALCIUM 20 MG PO TABS: 20.0000 mg | ORAL_TABLET | Freq: Every day | ORAL | 5 refills | Status: DC

## 2016-08-05 MED ORDER — GLIPIZIDE 5 MG PO TABS: 5.0000 mg | ORAL_TABLET | Freq: Two times a day (BID) | ORAL | 6 refills | Status: DC

## 2016-08-05 MED ORDER — LOSARTAN POTASSIUM 100 MG PO TABS: 100.0000 mg | ORAL_TABLET | Freq: Every day | ORAL | 6 refills | Status: DC

## 2016-08-05 MED ORDER — TRAZODONE HCL 50 MG PO TABS: 25.0000 mg | ORAL_TABLET | Freq: Every day | ORAL | 6 refills | Status: DC

## 2016-08-05 NOTE — Assessment & Plan Note (Signed)
Continue trazodone one half a tablet at bedtime she also has alprazolam in case this does not work

## 2016-08-05 NOTE — Assessment & Plan Note (Signed)
In general she is doing well she will continue with the Aricept

## 2016-08-05 NOTE — Assessment & Plan Note (Signed)
Goal is A1c less than 8%. Recheck her A1c fasting labs today. Her blood pressure looks good. Continue to watch her appetite she does tend to fluctuate. We'll also make sure that she has her supplement drink once a day. Her medications were refilled and this was discussed with her son who was present for the exam.

## 2016-08-05 NOTE — Addendum Note (Signed)
Addended by: Sheral Flow on: 08/05/2016 11:59 AM   Modules accepted: Orders

## 2016-08-05 NOTE — Patient Instructions (Signed)
F/U 4 months  Flu shot  Medication refilled We will call with lab results

## 2016-08-05 NOTE — Progress Notes (Signed)
Subjective:    Patient ID: Betty [name], female    DOB: 1931/08/13, 82 y.o.   MRN: [id number]  Patient presents for F/U (is fasting)  Patient to follow-up chronic medical problems. She is taking her medicines as described.  Diabetes mellitus last A1c was 8.3% and her glipizide was changed to 5 mg twice a day Hypertension she is now back on hydrochlorothiazide 12.5 mg as well as losartan and metoprolol her blood pressures and better controlled with this. Previous leg swelling also improved She did not bring her meter with her today she does have her blood pressure readings which arrange from 1:30 to 140 over 60s to 70  Weight down 5 pounds appetite is good per report she is also drinking boost a couple times a week She has not had any falls   Review Of Systems:  GEN- denies fatigue, fever, weight loss,weakness, recent illness HEENT- denies eye drainage, change in vision, nasal discharge, CVS- denies chest pain, palpitations RESP- denies SOB, cough, wheeze ABD- denies N/V, change in stools, abd pain GU- denies dysuria, hematuria, dribbling, incontinence MSK- denies joint pain, muscle aches, injury Neuro- denies headache, dizziness, syncope, seizure activity       Objective:    BP 134/70 (BP Location: Left Arm, Patient Position: Sitting, Cuff Size: Normal)   Pulse (!) 56   Temp 98.2 F (36.8 C) (Oral)   Resp 14   Ht 5\' 1"  (1.549 m)   Wt 127 lb (57.6 kg)   SpO2 99%   BMI 24.00 kg/m  GEN- NAD, alert and oriented x3 HEENT- PERRL, EOMI, non injected sclera, pink conjunctiva, MMM, oropharynx clear Neck- Supple, no thyromegaly CVS- RRR, 2/6 SEM  RESP-CTAB ABD-NABS,soft,NT,ND EXT- No edema Pulses- Radial, DP- 2+        Assessment & Plan:      Problem List Items Addressed This Visit    Type II diabetes mellitus with renal manifestations (HCC) - Primary    Goal is A1c less than 8%. Recheck her A1c fasting labs today. Her blood pressure looks good. Continue to  watch her appetite she does tend to fluctuate. We'll also make sure that she has her supplement drink once a day. Her medications were refilled and this was discussed with her son who was present for the exam.      Relevant Medications   losartan (COZAAR) 100 MG tablet   rosuvastatin (CRESTOR) 20 MG tablet   glipiZIDE (GLUCOTROL) 5 MG tablet   Other Relevant Orders   CBC with Differential/Platelet   Comprehensive metabolic panel   Hemoglobin A1c   Mild dementia    In general she is doing well she will continue with the Aricept      Relevant Medications   ALPRAZolam (XANAX) 0.25 MG tablet   donepezil (ARICEPT) 10 MG tablet   traZODone (DESYREL) 50 MG tablet   Hyperlipidemia   Relevant Medications   hydrochlorothiazide (HYDRODIURIL) 12.5 MG tablet   losartan (COZAAR) 100 MG tablet   metoprolol tartrate (LOPRESSOR) 25 MG tablet   rosuvastatin (CRESTOR) 20 MG tablet   Other Relevant Orders   Lipid panel   Essential hypertension, benign   Relevant Medications   hydrochlorothiazide (HYDRODIURIL) 12.5 MG tablet   losartan (COZAAR) 100 MG tablet   metoprolol tartrate (LOPRESSOR) 25 MG tablet   rosuvastatin (CRESTOR) 20 MG tablet   CKD (chronic kidney disease), stage III   Chronic insomnia    Continue trazodone one half a tablet at bedtime she also has alprazolam in  case this does not work       Other Visit Diagnoses    Need for prophylactic vaccination and inoculation against influenza       Relevant Medications   glipiZIDE (GLUCOTROL) 5 MG tablet      Note: This dictation was prepared with Dragon dictation along with smaller phrase technology. Any transcriptional errors that result from this process are unintentional.

## 2016-08-18 ENCOUNTER — Other Ambulatory Visit: Payer: Self-pay | Admitting: *Deleted

## 2016-08-18 DIAGNOSIS — N189 Chronic kidney disease, unspecified: Secondary | ICD-10-CM

## 2016-09-16 ENCOUNTER — Other Ambulatory Visit: Payer: Self-pay

## 2016-09-28 ENCOUNTER — Other Ambulatory Visit: Payer: Medicare HMO

## 2016-09-28 DIAGNOSIS — N189 Chronic kidney disease, unspecified: Secondary | ICD-10-CM | POA: Diagnosis not present

## 2016-09-28 LAB — BASIC METABOLIC PANEL WITH GFR
BUN: 22 mg/dL (ref 7–25)
CHLORIDE: 100 mmol/L (ref 98–110)
CO2: 23 mmol/L (ref 20–31)
Calcium: 8.8 mg/dL (ref 8.6–10.4)
Creat: 1.43 mg/dL — ABNORMAL HIGH (ref 0.60–0.88)
GFR, EST NON AFRICAN AMERICAN: 34 mL/min — AB (ref >=60)
GFR, Est African American: 39 mL/min — ABNORMAL LOW (ref >=60)
Glucose, Bld: 132 mg/dL — ABNORMAL HIGH (ref 70–99)
POTASSIUM: 3.7 mmol/L (ref 3.5–5.3)
SODIUM: 136 mmol/L (ref 135–146)

## 2016-09-29 ENCOUNTER — Telehealth: Payer: Self-pay

## 2016-09-29 NOTE — Telephone Encounter (Signed)
Spoke to son Clayvon. Gave results. He verbalized understanding.

## 2016-09-29 NOTE — Telephone Encounter (Signed)
-----   Message from Susy Frizzle, MD sent at 09/29/2016  7:18 AM EST ----- Stable CKD.

## 2016-12-05 ENCOUNTER — Ambulatory Visit (INDEPENDENT_AMBULATORY_CARE_PROVIDER_SITE_OTHER): Payer: Medicare HMO | Admitting: Family Medicine

## 2016-12-05 ENCOUNTER — Encounter: Payer: Self-pay | Admitting: Family Medicine

## 2016-12-05 VITALS — BP 130/74 | HR 80 | Temp 98.1°F | Resp 14 | Ht 61.0 in | Wt 127.0 lb

## 2016-12-05 DIAGNOSIS — E782 Mixed hyperlipidemia: Secondary | ICD-10-CM | POA: Diagnosis not present

## 2016-12-05 DIAGNOSIS — E1122 Type 2 diabetes mellitus with diabetic chronic kidney disease: Secondary | ICD-10-CM | POA: Diagnosis not present

## 2016-12-05 DIAGNOSIS — F03A0 Unspecified dementia, mild, without behavioral disturbance, psychotic disturbance, mood disturbance, and anxiety: Secondary | ICD-10-CM

## 2016-12-05 DIAGNOSIS — N183 Chronic kidney disease, stage 3 unspecified: Secondary | ICD-10-CM

## 2016-12-05 DIAGNOSIS — I10 Essential (primary) hypertension: Secondary | ICD-10-CM

## 2016-12-05 DIAGNOSIS — R69 Illness, unspecified: Secondary | ICD-10-CM | POA: Diagnosis not present

## 2016-12-05 DIAGNOSIS — F0390 Unspecified dementia without behavioral disturbance: Secondary | ICD-10-CM

## 2016-12-05 LAB — CBC WITH DIFFERENTIAL/PLATELET
BASOS PCT: 0 %
Basophils Absolute: 0 cells/uL (ref 0–200)
EOS ABS: 0 {cells}/uL — AB (ref 15–500)
Eosinophils Relative: 0 %
HEMATOCRIT: 31.7 % — AB (ref 35.0–45.0)
Hemoglobin: 10.2 g/dL — ABNORMAL LOW (ref 12.0–15.0)
LYMPHS PCT: 27 %
Lymphs Abs: 1728 cells/uL (ref 850–3900)
MCH: 27.1 pg (ref 27.0–33.0)
MCHC: 32.2 g/dL (ref 32.0–36.0)
MCV: 84.1 fL (ref 80.0–100.0)
MONOS PCT: 9 %
MPV: 10 fL (ref 7.5–12.5)
Monocytes Absolute: 576 cells/uL (ref 200–950)
Neutro Abs: 4096 cells/uL (ref 1500–7800)
Neutrophils Relative %: 64 %
PLATELETS: 209 10*3/uL (ref 140–400)
RBC: 3.77 MIL/uL — AB (ref 3.80–5.10)
RDW: 14.6 % (ref 11.0–15.0)
WBC: 6.4 10*3/uL (ref 3.8–10.8)

## 2016-12-05 LAB — COMPREHENSIVE METABOLIC PANEL
ALK PHOS: 52 U/L (ref 33–130)
ALT: 11 U/L (ref 6–29)
AST: 15 U/L (ref 10–35)
Albumin: 3.8 g/dL (ref 3.6–5.1)
BUN: 25 mg/dL (ref 7–25)
CALCIUM: 9.1 mg/dL (ref 8.6–10.4)
CO2: 26 mmol/L (ref 20–31)
Chloride: 102 mmol/L (ref 98–110)
Creat: 1.48 mg/dL — ABNORMAL HIGH (ref 0.60–0.88)
GLUCOSE: 136 mg/dL — AB (ref 70–99)
POTASSIUM: 4.3 mmol/L (ref 3.5–5.3)
Sodium: 137 mmol/L (ref 135–146)
Total Bilirubin: 0.4 mg/dL (ref 0.2–1.2)
Total Protein: 6.2 g/dL (ref 6.1–8.1)

## 2016-12-05 LAB — HEMOGLOBIN A1C
Hgb A1c MFr Bld: 7.5 % — ABNORMAL HIGH (ref <5.7)
Mean Plasma Glucose: 169 mg/dL

## 2016-12-05 LAB — LIPID PANEL
CHOL/HDL RATIO: 2.6 ratio (ref <5.0)
CHOLESTEROL: 173 mg/dL (ref <200)
HDL: 66 mg/dL (ref >50)
LDL CALC: 87 mg/dL (ref <100)
TRIGLYCERIDES: 98 mg/dL (ref <150)
VLDL: 20 mg/dL (ref <30)

## 2016-12-05 MED ORDER — ALPRAZOLAM 0.25 MG PO TABS: 0.2500 mg | ORAL_TABLET | Freq: Every evening | ORAL | 3 refills | Status: DC | PRN

## 2016-12-05 NOTE — Patient Instructions (Signed)
F/U 4 months for Physical  

## 2016-12-05 NOTE — Progress Notes (Signed)
   Subjective:    Patient ID: Betty [name], female    DOB: November 06, 1930, 83 y.o.   MRN: [id number]  Patient presents for 4 month F/U (is fasting)  Pt here to f/u DM- last A1C 7.8% , CBG Has been good she denies any hypoglycemic symptoms. Did not bring her meter with her today. Denies any blood sugars greater than 200   No difficultues at home, children are her caregivers. Taking Aricept for dementia. No falls HTN- tolerating BP medications   Anxiety and insomnia- uses xanax as needed, trazodone for sleep     Review Of Systems:  GEN- denies fatigue, fever, weight loss,weakness, recent illness HEENT- denies eye drainage, change in vision, nasal discharge, CVS- denies chest pain, palpitations RESP- denies SOB, cough, wheeze ABD- denies N/V, change in stools, abd pain GU- denies dysuria, hematuria, dribbling, incontinence MSK- denies joint pain, muscle aches, injury Neuro- denies headache, dizziness, syncope, seizure activity       Objective:    BP 130/74   Pulse 80   Temp 98.1 F (36.7 C) (Oral)   Resp 14   Ht 5\' 1"  (1.549 m)   Wt 127 lb (57.6 kg)   SpO2 99%   BMI 24.00 kg/m  GEN- NAD, alert and oriented x3 HEENT- PERRL, EOMI, non injected sclera, pink conjunctiva, MMM, oropharynx clear Neck- Supple, no thyromegaly CVS- RRR, 2/6 SEM  RESP-CTAB ABD-NABS,soft,NT,ND EXT- No edema Pulses- Radial, DP- 2+        Assessment & Plan:      Problem List Items Addressed This Visit    Type II diabetes mellitus with renal manifestations (HCC)    A1c goal is less than 8%. She's been doing well with regards to her blood sugars she is also maintaining her weight she does drink ensure as needed. Family's no particular concerns today. We'll continue her current regimen of medications I will check her kidney function A1c today.      Relevant Orders   Hemoglobin A1c (Completed)   Mild dementia - Primary    Continue with Aricept no significant decline      Relevant  Medications   ALPRAZolam (XANAX) 0.25 MG tablet   Hyperlipidemia   Relevant Orders   Lipid panel (Completed)   Essential hypertension, benign    Blood pressure is well controlled      CKD (chronic kidney disease), stage III   Relevant Orders   CBC with Differential/Platelet (Completed)   Comprehensive metabolic panel (Completed)      Note: This dictation was prepared with Dragon dictation along with smaller phrase technology. Any transcriptional errors that result from this process are unintentional.

## 2016-12-06 NOTE — Assessment & Plan Note (Signed)
A1c goal is less than 8%. She's been doing well with regards to her blood sugars she is also maintaining her weight she does drink ensure as needed. Family's no particular concerns today. We'll continue her current regimen of medications I will check her kidney function A1c today.

## 2016-12-06 NOTE — Assessment & Plan Note (Signed)
Blood pressure is well controlled 

## 2016-12-06 NOTE — Assessment & Plan Note (Addendum)
Continue with Aricept no significant decline

## 2017-02-01 ENCOUNTER — Other Ambulatory Visit: Payer: Self-pay | Admitting: Family Medicine

## 2017-02-25 ENCOUNTER — Other Ambulatory Visit: Payer: Self-pay | Admitting: Family Medicine

## 2017-02-25 DIAGNOSIS — Z23 Encounter for immunization: Secondary | ICD-10-CM

## 2017-02-28 NOTE — Telephone Encounter (Signed)
Refill appropriate and filled per protocol. 

## 2017-04-10 ENCOUNTER — Encounter: Payer: Medicare HMO | Admitting: Family Medicine

## 2017-04-10 ENCOUNTER — Ambulatory Visit: Payer: Medicare HMO | Admitting: Family Medicine

## 2017-04-28 ENCOUNTER — Encounter: Payer: Self-pay | Admitting: Family Medicine

## 2017-04-28 ENCOUNTER — Ambulatory Visit (INDEPENDENT_AMBULATORY_CARE_PROVIDER_SITE_OTHER): Payer: Medicare HMO | Admitting: Family Medicine

## 2017-04-28 ENCOUNTER — Encounter: Payer: Medicare HMO | Admitting: Family Medicine

## 2017-04-28 VITALS — BP 128/64 | HR 68 | Temp 98.5°F | Resp 14 | Ht 61.0 in | Wt 129.0 lb

## 2017-04-28 DIAGNOSIS — E1122 Type 2 diabetes mellitus with diabetic chronic kidney disease: Secondary | ICD-10-CM | POA: Diagnosis not present

## 2017-04-28 DIAGNOSIS — F0390 Unspecified dementia without behavioral disturbance: Secondary | ICD-10-CM

## 2017-04-28 DIAGNOSIS — N183 Chronic kidney disease, stage 3 unspecified: Secondary | ICD-10-CM

## 2017-04-28 DIAGNOSIS — I10 Essential (primary) hypertension: Secondary | ICD-10-CM

## 2017-04-28 DIAGNOSIS — F5104 Psychophysiologic insomnia: Secondary | ICD-10-CM

## 2017-04-28 DIAGNOSIS — Z0000 Encounter for general adult medical examination without abnormal findings: Secondary | ICD-10-CM | POA: Diagnosis not present

## 2017-04-28 DIAGNOSIS — R69 Illness, unspecified: Secondary | ICD-10-CM | POA: Diagnosis not present

## 2017-04-28 DIAGNOSIS — F03A0 Unspecified dementia, mild, without behavioral disturbance, psychotic disturbance, mood disturbance, and anxiety: Secondary | ICD-10-CM

## 2017-04-28 DIAGNOSIS — E782 Mixed hyperlipidemia: Secondary | ICD-10-CM | POA: Diagnosis not present

## 2017-04-28 NOTE — Patient Instructions (Addendum)
Discuss Namenda, this is used in addition to Aricept for Dementia It does come as a combination pill called Namzeric  F/U 4 months

## 2017-04-28 NOTE — Progress Notes (Signed)
Subjective:   Patient presents for Medicare Annual/Subsequent preventive examination.  Patient here for a wellness exam. Patient here with her grandson who brings her to all of her appointments. She's been doing well the past few months. She did have a tick bite removed from her right ankle but did not notice any rash or swelling afterwards. She has been eating well. He has noticed occasional decline in her mental status. She has underlying dementia. He did go away for a week and she does not seem to know who he was when he returned. She also gets confused when they're driving to did not remember coming to her doctor's office could not remember if I was a female or female physician. She is currently taking the Aricept without any difficulties  DM- blood sugars have been "good" last A1C 7.5% , no hypoglycemia symptoms Review Past Medical/Family/Social: Per EMR   Risk Factors  Current exercise habits: walks Dietary issues discussed: None  Cardiac risk factors: HTN, DM  Depression Screen  (Note: if answer to either of the following is "Yes", a more complete depression screening is indicated)  Over the past two weeks, have you felt down, depressed or hopeless? No Over the past two weeks, have you felt little interest or pleasure in doing things? No Have you lost interest or pleasure in daily life? No Do you often feel hopeless? No Do you cry easily over simple problems? No   Activities of Daily Living  In your present state of health, do you have any difficulty performing the following activities?:  Driving?yes Managing money? yes  Feeding yourself? No  Getting from bed to chair? No  Climbing a flight of stairs? No  Preparing food and eating?: No  Bathing or showering? No  Getting dressed: No  Getting to the toilet? No  Using the toilet:No  Moving around from place to place: No  In the past year have you fallen or had a near fall?:No  Are you sexually active? No  Do you have more  than one partner? No   Hearing Difficulties: No  Do you often ask people to speak up or repeat themselves? No  Do you experience ringing or noises in your ears? No Do you have difficulty understanding soft or whispered voices? No  Do you feel that you have a problem with memory? YES Do you often misplace items?YES Do you feel safe at home? Yes  Cognitive Testing - DEMENTIA noted  Alert? Yes Normal Appearance?Yes  Oriented to person? Yes Place? Yes  Time? Yes  Recall of three objects? Yes  Can perform simple calculations? Yes  Displays appropriate judgment?Yes  Can read the correct time from a watch face?Yes   List the Names of Other Physician/Practitioners you currently use:  Eye doctor   Screening Tests / Date Passed screening ages Pneumonia- UTD                     Zostavax  UTD Influenza Vaccine UTD  ROS:  GEN- denies fatigue, fever, weight loss,weakness, recent illness HEENT- denies eye drainage, change in vision, nasal discharge, CVS- denies chest pain, palpitations RESP- denies SOB, cough, wheeze ABD- denies N/V, change in stools, abd pain GU- denies dysuria, hematuria, dribbling, incontinence MSK- denies joint pain, muscle aches, injury Neuro- denies headache, dizziness, syncope, seizure activity  PHYSICAL: GEN- NAD, alert and oriented x3 HEENT- PERRL, EOMI, non injected sclera, pink conjunctiva, MMM, oropharynx clear, TM clear bilat no effusion  Neck- Supple, no thryomegaly,  no bruit CVS- RRR, 2/6 SEM RESP-CTAB ABD-NABS,soft,NT,ND EXT- No edema Pulses- Radial, DP- 2+      Assessment:    Annual wellness medicare exam   Plan:    During the course of the visit the patient was educated and counseled about appropriate screening and preventive services including:  Immunizations UTD, TDAP will be given for any injury  Screen NEG for depression. PHQ- 9 score of 12 (moderate depression) She is maintaining her weight and her appetite is good. She's not had  any falls at home. She has good care from her children/grandchildren.  Regards to her dementia she is having some decline discuss with her grandson the addition of Namenda to her Aricept. He is can discuss with Tammy  Who is power of attorney. If they're in agreement and I would like to eventually transition her to the Namzeric.  With regards to her chronic insomnia she is doing well with the trazodone and occasional use of the alprazolam.  Diabetes mellitus her blood sugars have been very well controlled for her age goal is less than 8% without any hypoglycemia symptoms. We'll recheck her O7S and metabolic panel today.  She has HPOA, Willmail the advanced directives packet for DNR discussion  Diet review for nutrition referral? Yes ____ Not Indicated __x__  Patient Instructions (the written plan) was given to the patient.  Medicare Attestation  I have personally reviewed:  The patient's medical and social history  Their use of alcohol, tobacco or illicit drugs  Their current medications and supplements  The patient's functional ability including ADLs,fall risks, home safety risks, cognitive, and hearing and visual impairment  Diet and physical activities  Evidence for depression or mood disorders  The patient's weight, height, BMI, and visual acuity have been recorded in the chart. I have made referrals, counseling, and provided education to the patient based on review of the above and I have provided the patient with a written personalized care plan for preventive services.

## 2017-04-29 LAB — COMPREHENSIVE METABOLIC PANEL WITH GFR
ALT: 12 U/L (ref 6–29)
AST: 15 U/L (ref 10–35)
Albumin: 3.9 g/dL (ref 3.6–5.1)
Alkaline Phosphatase: 52 U/L (ref 33–130)
BUN: 36 mg/dL — ABNORMAL HIGH (ref 7–25)
CO2: 20 mmol/L (ref 20–31)
Calcium: 9.1 mg/dL (ref 8.6–10.4)
Chloride: 109 mmol/L (ref 98–110)
Creat: 1.57 mg/dL — ABNORMAL HIGH (ref 0.60–0.88)
Glucose, Bld: 126 mg/dL — ABNORMAL HIGH (ref 70–99)
Potassium: 4.2 mmol/L (ref 3.5–5.3)
Sodium: 144 mmol/L (ref 135–146)
Total Bilirubin: 0.4 mg/dL (ref 0.2–1.2)
Total Protein: 6.5 g/dL (ref 6.1–8.1)

## 2017-04-29 LAB — HEMOGLOBIN A1C
HEMOGLOBIN A1C: 7.7 % — AB (ref <5.7)
MEAN PLASMA GLUCOSE: 174 mg/dL

## 2017-05-10 ENCOUNTER — Other Ambulatory Visit: Payer: Self-pay | Admitting: *Deleted

## 2017-05-10 MED ORDER — MEMANTINE HCL-DONEPEZIL HCL ER 28-10 MG PO CP24: 1.0000 | ORAL_CAPSULE | Freq: Every day | ORAL | 3 refills | Status: DC

## 2017-06-15 ENCOUNTER — Other Ambulatory Visit: Payer: Self-pay

## 2017-06-15 MED ORDER — MEMANTINE HCL-DONEPEZIL HCL ER 28-10 MG PO CP24: 1.0000 | ORAL_CAPSULE | Freq: Every day | ORAL | 3 refills | Status: DC

## 2017-06-15 NOTE — Telephone Encounter (Signed)
Refill appropriate 

## 2017-06-27 ENCOUNTER — Other Ambulatory Visit: Payer: Self-pay | Admitting: Family Medicine

## 2017-06-27 NOTE — Telephone Encounter (Signed)
Okay to refill? 

## 2017-06-27 NOTE — Telephone Encounter (Signed)
Ok to refill??  Last office visit 03/30/2017.  Last refill 12/05/2016, #2 refills.

## 2017-07-07 ENCOUNTER — Telehealth: Payer: Self-pay | Admitting: *Deleted

## 2017-07-07 NOTE — Telephone Encounter (Signed)
Received letter from [employer].   Reports that HCTZ (Lot [id number]) has been recalled D/T manufacturing error. Aldactone was noted in shipment.   Call placed to patient son Clayvon to make aware. [place].

## 2017-07-07 NOTE — Telephone Encounter (Signed)
Received return call from patient son, Clayvon.   Reports that in addition to the letter from [employer], he received letter from pharmacy with pictures of what the medication is supposed to look like. Reports that he has checked patient's medication and noted to be correct.

## 2017-07-11 ENCOUNTER — Ambulatory Visit (INDEPENDENT_AMBULATORY_CARE_PROVIDER_SITE_OTHER): Payer: Medicare HMO | Admitting: *Deleted

## 2017-07-11 DIAGNOSIS — Z23 Encounter for immunization: Secondary | ICD-10-CM

## 2017-08-28 ENCOUNTER — Other Ambulatory Visit: Payer: Self-pay | Admitting: *Deleted

## 2017-08-28 MED ORDER — ROSUVASTATIN CALCIUM 20 MG PO TABS: ORAL_TABLET | ORAL | 3 refills | Status: DC

## 2017-08-29 ENCOUNTER — Ambulatory Visit: Payer: Medicare HMO | Admitting: Family Medicine

## 2017-09-01 ENCOUNTER — Ambulatory Visit: Payer: Medicare HMO | Admitting: Family Medicine

## 2017-09-11 ENCOUNTER — Ambulatory Visit: Payer: Medicare HMO | Admitting: Family Medicine

## 2017-09-15 ENCOUNTER — Other Ambulatory Visit: Payer: Self-pay | Admitting: *Deleted

## 2017-09-15 MED ORDER — TRAZODONE HCL 50 MG PO TABS: 25.0000 mg | ORAL_TABLET | Freq: Every day | ORAL | 6 refills | Status: DC

## 2017-10-26 ENCOUNTER — Other Ambulatory Visit: Payer: Self-pay | Admitting: *Deleted

## 2017-10-26 DIAGNOSIS — Z23 Encounter for immunization: Secondary | ICD-10-CM

## 2017-10-26 MED ORDER — METOPROLOL TARTRATE 25 MG PO TABS: ORAL_TABLET | ORAL | 6 refills | Status: DC

## 2017-10-26 MED ORDER — GLIPIZIDE 5 MG PO TABS: ORAL_TABLET | ORAL | 6 refills | Status: DC

## 2017-10-26 MED ORDER — LOSARTAN POTASSIUM 100 MG PO TABS: 100.0000 mg | ORAL_TABLET | Freq: Every day | ORAL | 6 refills | Status: DC

## 2017-10-26 MED ORDER — HYDROCHLOROTHIAZIDE 12.5 MG PO TABS: 12.5000 mg | ORAL_TABLET | Freq: Every day | ORAL | 6 refills | Status: DC

## 2017-10-27 ENCOUNTER — Encounter: Payer: Self-pay | Admitting: Family Medicine

## 2017-10-27 ENCOUNTER — Ambulatory Visit (INDEPENDENT_AMBULATORY_CARE_PROVIDER_SITE_OTHER): Payer: Medicare HMO | Admitting: Family Medicine

## 2017-10-27 ENCOUNTER — Other Ambulatory Visit: Payer: Self-pay

## 2017-10-27 VITALS — BP 118/60 | HR 66 | Temp 98.7°F | Resp 14 | Ht 61.0 in | Wt 118.0 lb

## 2017-10-27 DIAGNOSIS — F0280 Dementia in other diseases classified elsewhere without behavioral disturbance: Secondary | ICD-10-CM

## 2017-10-27 DIAGNOSIS — N183 Chronic kidney disease, stage 3 unspecified: Secondary | ICD-10-CM

## 2017-10-27 DIAGNOSIS — E1122 Type 2 diabetes mellitus with diabetic chronic kidney disease: Secondary | ICD-10-CM

## 2017-10-27 DIAGNOSIS — G309 Alzheimer's disease, unspecified: Secondary | ICD-10-CM | POA: Diagnosis not present

## 2017-10-27 DIAGNOSIS — R634 Abnormal weight loss: Secondary | ICD-10-CM | POA: Diagnosis not present

## 2017-10-27 DIAGNOSIS — E782 Mixed hyperlipidemia: Secondary | ICD-10-CM

## 2017-10-27 DIAGNOSIS — I10 Essential (primary) hypertension: Secondary | ICD-10-CM

## 2017-10-27 DIAGNOSIS — R69 Illness, unspecified: Secondary | ICD-10-CM | POA: Diagnosis not present

## 2017-10-27 NOTE — Patient Instructions (Addendum)
Nasal saline  F/U 1 month recheck  Freestyle [place]

## 2017-10-27 NOTE — Progress Notes (Signed)
   Subjective:    Patient ID: Betty [name], female    DOB: 1931-05-25, 84 y.o.   MRN: [id number]  Patient presents for Follow-up (is fasting) Here to follow-up chronic medical problems.  Medications reviewed  Diabetes mellitus her last A1c 7.7% goal is less than 8% and not been checking her blood sugars did request the no stick meter.  On glipizide 5mg  BID  SHe often will skip meals, states she cant eat when she takes her meds, gets comfused and refuses to eat dinner telling her son it will interfere with her sleep pilling. Family has noticed more of this talk with her change in dementia  she does drink ensure daily   Started on Namzeric back in July  HTN- taking BP meds as prescribed    Review Of Systems:  GEN- denies fatigue, fever, weight loss,weakness, recent illness HEENT- denies eye drainage, change in vision, nasal discharge, CVS- denies chest pain, palpitations RESP- denies SOB, cough, wheeze ABD- denies N/V, change in stools, abd pain GU- denies dysuria, hematuria, dribbling, incontinence MSK- denies joint pain, muscle aches, injury Neuro- denies headache, dizziness, syncope, seizure activity       Objective:    BP 118/60   Pulse 66   Temp 98.7 F (37.1 C) (Oral)   Resp 14   Ht 5\' 1"  (1.549 m)   Wt 118 lb (53.5 kg)   SpO2 100%   BMI 22.30 kg/m  GEN- NAD, alert and oriented x3 HEENT- PERRL, EOMI, non injected sclera, pink conjunctiva, MMM, oropharynx clear Neck- Supple, no thyromegaly CVS- RRR,2/6  murmur RESP-CTAB ABD-NABS,soft,NT,ND EXT- No edema Pulses- Radial, DP- 2+        Assessment & Plan:      Problem List Items Addressed This Visit      Unprioritized   Hyperlipidemia   Relevant Orders   Lipid panel (Completed)   CKD (chronic kidney disease), stage III (HCC)   Type II diabetes mellitus with renal manifestations (HCC) - Primary   Relevant Orders   CBC with Differential/Platelet (Completed)   Comprehensive metabolic panel  (Completed)   Hemoglobin A1c (Completed)   Lipid panel (Completed)   HM DIABETES FOOT EXAM (Completed)   Essential hypertension, benign   Dementia   Weight loss    I think this is in correlation with her worsening dementia Discussed with her needs to eat with her meds Son also at bedside to reiterate Continue ensure Will decrease glipizide down to 5mg  daily, A1C is  7.9% but with her decrease in nutrition prefer her to run a little higher HTN - blood pressure controlled F/U 1 MONTH for weight check           Note: This dictation was prepared with Dragon dictation along with smaller phrase technology. Any transcriptional errors that result from this process are unintentional.

## 2017-10-28 ENCOUNTER — Encounter: Payer: Self-pay | Admitting: Family Medicine

## 2017-10-28 LAB — CBC WITH DIFFERENTIAL/PLATELET
BASOS PCT: 0.2 %
Basophils Absolute: 12 cells/uL (ref 0–200)
EOS PCT: 0.3 %
Eosinophils Absolute: 18 cells/uL (ref 15–500)
HEMATOCRIT: 31.3 % — AB (ref 35.0–45.0)
Hemoglobin: 10 g/dL — ABNORMAL LOW (ref 11.7–15.5)
LYMPHS ABS: 1752 {cells}/uL (ref 850–3900)
MCH: 26.7 pg — ABNORMAL LOW (ref 27.0–33.0)
MCHC: 31.9 g/dL — ABNORMAL LOW (ref 32.0–36.0)
MCV: 83.5 fL (ref 80.0–100.0)
MPV: 10.9 fL (ref 7.5–12.5)
Monocytes Relative: 9.6 %
Neutro Abs: 3642 cells/uL (ref 1500–7800)
Neutrophils Relative %: 60.7 %
PLATELETS: 209 10*3/uL (ref 140–400)
RBC: 3.75 10*6/uL — AB (ref 3.80–5.10)
RDW: 13.2 % (ref 11.0–15.0)
TOTAL LYMPHOCYTE: 29.2 %
WBC: 6 10*3/uL (ref 3.8–10.8)
WBCMIX: 576 {cells}/uL (ref 200–950)

## 2017-10-28 LAB — COMPREHENSIVE METABOLIC PANEL
AG Ratio: 1.5 (calc) (ref 1.0–2.5)
ALBUMIN MSPROF: 3.8 g/dL (ref 3.6–5.1)
ALKALINE PHOSPHATASE (APISO): 50 U/L (ref 33–130)
ALT: 8 U/L (ref 6–29)
AST: 15 U/L (ref 10–35)
BUN/Creatinine Ratio: 18 (calc) (ref 6–22)
BUN: 29 mg/dL — AB (ref 7–25)
CHLORIDE: 107 mmol/L (ref 98–110)
CO2: 27 mmol/L (ref 20–32)
CREATININE: 1.58 mg/dL — AB (ref 0.60–0.88)
Calcium: 9.3 mg/dL (ref 8.6–10.4)
GLOBULIN: 2.6 g/dL (ref 1.9–3.7)
Glucose, Bld: 131 mg/dL — ABNORMAL HIGH (ref 65–99)
POTASSIUM: 3.9 mmol/L (ref 3.5–5.3)
SODIUM: 143 mmol/L (ref 135–146)
TOTAL PROTEIN: 6.4 g/dL (ref 6.1–8.1)
Total Bilirubin: 0.3 mg/dL (ref 0.2–1.2)

## 2017-10-28 LAB — LIPID PANEL
CHOL/HDL RATIO: 3.1 (calc) (ref <5.0)
CHOLESTEROL: 200 mg/dL — AB (ref <200)
HDL: 64 mg/dL (ref >50)
LDL CHOLESTEROL (CALC): 112 mg/dL — AB
Non-HDL Cholesterol (Calc): 136 mg/dL (calc) — ABNORMAL HIGH (ref <130)
Triglycerides: 125 mg/dL (ref <150)

## 2017-10-28 LAB — HEMOGLOBIN A1C
HEMOGLOBIN A1C: 7.9 %{Hb} — AB (ref <5.7)
MEAN PLASMA GLUCOSE: 180 (calc)
eAG (mmol/L): 10 (calc)

## 2017-10-28 NOTE — Assessment & Plan Note (Signed)
I think this is in correlation with her worsening dementia Discussed with her needs to eat with her meds Son also at bedside to reiterate Continue ensure Will decrease glipizide down to 5mg  daily, A1C is  7.9% but with her decrease in nutrition prefer her to run a little higher HTN - blood pressure controlled F/U 1 MONTH for weight check

## 2017-11-02 ENCOUNTER — Other Ambulatory Visit: Payer: Self-pay | Admitting: *Deleted

## 2017-11-02 DIAGNOSIS — Z23 Encounter for immunization: Secondary | ICD-10-CM

## 2017-11-02 MED ORDER — FREESTYLE LIBRE 14 DAY SENSOR MISC: 1.0000 | Freq: Every day | 1 refills | Status: DC

## 2017-11-02 MED ORDER — FREESTYLE LIBRE 14 DAY READER DEVI: 1.0000 | Freq: Every day | 1 refills | Status: DC

## 2017-11-02 MED ORDER — GLIPIZIDE 5 MG PO TABS: ORAL_TABLET | ORAL | 6 refills | Status: DC

## 2017-11-06 ENCOUNTER — Telehealth: Payer: Self-pay | Admitting: *Deleted

## 2017-11-06 NOTE — Telephone Encounter (Signed)
Your PA has been faxed to the plan as a paper copy. Please contact the plan directly if you haven't received a determination in a typical timeframe.  You will be notified of the determination via fax. 

## 2017-11-06 NOTE — Telephone Encounter (Signed)
Received request from pharmacy for PA on [facility] and sensor.   PA submitted.   Dx: E11.9- DM.    THE PATIENT HAS DEMENTIA AND ARTHRITIS IN HANDS. CHECKING HER FSBS 2-3 TIMES DAILY AS NEEDED IS A HARDSHIP AS SHE HAS TO HAVE HER FINGERS STUCK. THE FREESTYLE LIBRE SENSOR WILL HELP TO MONITOR HER CBG AS NEEDED WITHOUT THE NEED TO HAVE HER FINGERS STUCK.

## 2017-11-16 ENCOUNTER — Encounter: Payer: Self-pay | Admitting: *Deleted

## 2017-11-16 NOTE — Telephone Encounter (Signed)
Received fax from [employer].   Reports that Betty [name] will have to be filed under Medicare part B and requested further information.   Faxed to [employer].

## 2017-11-20 ENCOUNTER — Other Ambulatory Visit: Payer: Self-pay | Admitting: Family Medicine

## 2017-11-22 NOTE — Telephone Encounter (Signed)
Received determination of PA.   PA denied.

## 2017-11-28 ENCOUNTER — Other Ambulatory Visit: Payer: Self-pay | Admitting: *Deleted

## 2017-11-28 ENCOUNTER — Encounter: Payer: Self-pay | Admitting: Family Medicine

## 2017-11-28 ENCOUNTER — Ambulatory Visit: Payer: Medicare HMO | Admitting: Family Medicine

## 2017-11-28 ENCOUNTER — Other Ambulatory Visit: Payer: Self-pay

## 2017-11-28 VITALS — BP 142/78 | HR 60 | Temp 98.9°F | Resp 16 | Ht 61.0 in | Wt 124.0 lb

## 2017-11-28 DIAGNOSIS — N183 Type 2 diabetes mellitus with diabetic chronic kidney disease: Secondary | ICD-10-CM

## 2017-11-28 DIAGNOSIS — E441 Mild protein-calorie malnutrition: Secondary | ICD-10-CM | POA: Diagnosis not present

## 2017-11-28 DIAGNOSIS — G309 Alzheimer's disease, unspecified: Secondary | ICD-10-CM

## 2017-11-28 DIAGNOSIS — F0280 Dementia in other diseases classified elsewhere without behavioral disturbance: Secondary | ICD-10-CM | POA: Diagnosis not present

## 2017-11-28 DIAGNOSIS — E1122 Type 2 diabetes mellitus with diabetic chronic kidney disease: Secondary | ICD-10-CM | POA: Diagnosis not present

## 2017-11-28 DIAGNOSIS — R69 Illness, unspecified: Secondary | ICD-10-CM | POA: Diagnosis not present

## 2017-11-28 MED ORDER — LANCETS MISC: 11 refills | Status: DC

## 2017-11-28 NOTE — Progress Notes (Signed)
   Subjective:    Patient ID: Betty [name], female    DOB: Jan 01, 1931, 84 y.o.   MRN: [id number]  Patient presents for Follow-up (weight check)   Pt here to f/u her weight. Seen 1 month ago, in setting of dementia, diabetes, fluctating appetite, weight was decreasing. Weight at last visit in Jan 118lbs Her A1C was  7.9%, decreased glipizide to 5mg  once a day  Blood pressure controlled on current medications Has some mild runny nose, no fever, no congestion Feels good Son states they feed her before giving any pills she will eat and is eating good portions        Review Of Systems:  GEN- denies fatigue, fever, weight loss,weakness, recent illness HEENT- denies eye drainage, change in vision, nasal discharge, CVS- denies chest pain, palpitations RESP- denies SOB, cough, wheeze ABD- denies N/V, change in stools, abd pain GU- denies dysuria, hematuria, dribbling, incontinence MSK- denies joint pain, muscle aches, injury Neuro- denies headache, dizziness, syncope, seizure activity       Objective:    BP (!) 142/78   Pulse 60   Temp 98.9 F (37.2 C) (Oral)   Resp 16   Ht 5\' 1"  (1.549 m)   Wt 124 lb (56.2 kg)   SpO2 99%   BMI 23.43 kg/m  GEN- NAD, alert and oriented x3 HEENT- PERRL, EOMI, non injected sclera, pink conjunctiva, MMM, oropharynx clear, nares clear rhinorrhea, TM obscurred by wax in canal CVS- RRR, 2/6 systolic murmur RESP-CTAB EXT- No edema Pulses- Radial 2+        Assessment & Plan:      Problem List Items Addressed This Visit      Unprioritized   Dementia   Type II diabetes mellitus with renal manifestations ([place]) - Primary    Continue current dose of glipzide Family to start checking CBG Advised will run a little higher, as I reduced her meds To keep from having hypoglycemia symptoms       Mild protein malnutrition (HCC)    Improved weight gain Continue current home feeding regimen  No sign of acute illness         Note:  This dictation was prepared with Dragon dictation along with smaller phrase technology. Any transcriptional errors that result from this process are unintentional.

## 2017-11-28 NOTE — Assessment & Plan Note (Signed)
Continue current dose of glipzide Family to start checking CBG Advised will run a little higher, as I reduced her meds To keep from having hypoglycemia symptoms

## 2017-11-28 NOTE — Assessment & Plan Note (Signed)
Improved weight gain Continue current home feeding regimen  No sign of acute illness

## 2017-11-28 NOTE — Patient Instructions (Signed)
F/U 4 months  

## 2017-12-18 ENCOUNTER — Other Ambulatory Visit: Payer: Self-pay | Admitting: Family Medicine

## 2017-12-21 ENCOUNTER — Other Ambulatory Visit: Payer: Self-pay | Admitting: Family Medicine

## 2017-12-21 NOTE — Telephone Encounter (Signed)
Ok to refill??  Last office visit 11/28/2017.  Last refill 06/27/2017, #2 refills.

## 2018-01-15 ENCOUNTER — Other Ambulatory Visit: Payer: Self-pay | Admitting: Family Medicine

## 2018-01-18 ENCOUNTER — Other Ambulatory Visit: Payer: Self-pay | Admitting: Family Medicine

## 2018-01-24 ENCOUNTER — Other Ambulatory Visit: Payer: Self-pay | Admitting: Family Medicine

## 2018-02-01 ENCOUNTER — Other Ambulatory Visit: Payer: Self-pay | Admitting: Family Medicine

## 2018-02-02 NOTE — Telephone Encounter (Signed)
Ok to refill??  Last office visit 11/28/2017.  Last refill 12/22/2017. 

## 2018-02-27 ENCOUNTER — Other Ambulatory Visit: Payer: Self-pay | Admitting: Family Medicine

## 2018-03-16 ENCOUNTER — Other Ambulatory Visit: Payer: Self-pay | Admitting: Family Medicine

## 2018-03-26 ENCOUNTER — Inpatient Hospital Stay (HOSPITAL_COMMUNITY)
Admission: EM | Admit: 2018-03-26 | Discharge: 2018-04-02 | DRG: 481 | Disposition: A | Discharge status: Skilled Nursing Facility | Payer: Medicare HMO | Attending: Internal Medicine | Admitting: Internal Medicine

## 2018-03-26 ENCOUNTER — Other Ambulatory Visit: Payer: Self-pay

## 2018-03-26 ENCOUNTER — Emergency Department (HOSPITAL_COMMUNITY): Payer: Medicare HMO

## 2018-03-26 ENCOUNTER — Encounter (HOSPITAL_COMMUNITY): Payer: Self-pay | Admitting: Emergency Medicine

## 2018-03-26 DIAGNOSIS — W1830XA Fall on same level, unspecified, initial encounter: Secondary | ICD-10-CM | POA: Diagnosis not present

## 2018-03-26 DIAGNOSIS — S72142A Displaced intertrochanteric fracture of left femur, initial encounter for closed fracture: Secondary | ICD-10-CM | POA: Diagnosis not present

## 2018-03-26 DIAGNOSIS — Z4789 Encounter for other orthopedic aftercare: Secondary | ICD-10-CM | POA: Diagnosis not present

## 2018-03-26 DIAGNOSIS — Y92008 Other place in unspecified non-institutional (private) residence as the place of occurrence of the external cause: Secondary | ICD-10-CM | POA: Diagnosis not present

## 2018-03-26 DIAGNOSIS — I129 Hypertensive chronic kidney disease with stage 1 through stage 4 chronic kidney disease, or unspecified chronic kidney disease: Secondary | ICD-10-CM | POA: Diagnosis present

## 2018-03-26 DIAGNOSIS — S72002A Fracture of unspecified part of neck of left femur, initial encounter for closed fracture: Secondary | ICD-10-CM

## 2018-03-26 DIAGNOSIS — E860 Dehydration: Secondary | ICD-10-CM | POA: Diagnosis present

## 2018-03-26 DIAGNOSIS — Z9181 History of falling: Secondary | ICD-10-CM | POA: Diagnosis not present

## 2018-03-26 DIAGNOSIS — S72141P Displaced intertrochanteric fracture of right femur, subsequent encounter for closed fracture with malunion: Secondary | ICD-10-CM | POA: Diagnosis not present

## 2018-03-26 DIAGNOSIS — D72829 Elevated white blood cell count, unspecified: Secondary | ICD-10-CM | POA: Diagnosis present

## 2018-03-26 DIAGNOSIS — E1122 Type 2 diabetes mellitus with diabetic chronic kidney disease: Secondary | ICD-10-CM | POA: Diagnosis not present

## 2018-03-26 DIAGNOSIS — E1129 Type 2 diabetes mellitus with other diabetic kidney complication: Secondary | ICD-10-CM | POA: Diagnosis not present

## 2018-03-26 DIAGNOSIS — E1165 Type 2 diabetes mellitus with hyperglycemia: Secondary | ICD-10-CM | POA: Diagnosis not present

## 2018-03-26 DIAGNOSIS — Z7984 Long term (current) use of oral hypoglycemic drugs: Secondary | ICD-10-CM | POA: Diagnosis not present

## 2018-03-26 DIAGNOSIS — W19XXXA Unspecified fall, initial encounter: Secondary | ICD-10-CM | POA: Diagnosis not present

## 2018-03-26 DIAGNOSIS — F0390 Unspecified dementia without behavioral disturbance: Secondary | ICD-10-CM | POA: Diagnosis not present

## 2018-03-26 DIAGNOSIS — D649 Anemia, unspecified: Secondary | ICD-10-CM | POA: Diagnosis present

## 2018-03-26 DIAGNOSIS — S72009A Fracture of unspecified part of neck of unspecified femur, initial encounter for closed fracture: Secondary | ICD-10-CM

## 2018-03-26 DIAGNOSIS — R262 Difficulty in walking, not elsewhere classified: Secondary | ICD-10-CM | POA: Diagnosis not present

## 2018-03-26 DIAGNOSIS — N183 Chronic kidney disease, stage 3 unspecified: Secondary | ICD-10-CM | POA: Diagnosis present

## 2018-03-26 DIAGNOSIS — F0391 Unspecified dementia with behavioral disturbance: Secondary | ICD-10-CM | POA: Diagnosis present

## 2018-03-26 DIAGNOSIS — E782 Mixed hyperlipidemia: Secondary | ICD-10-CM | POA: Diagnosis not present

## 2018-03-26 DIAGNOSIS — Z419 Encounter for procedure for purposes other than remedying health state, unspecified: Secondary | ICD-10-CM

## 2018-03-26 DIAGNOSIS — I10 Essential (primary) hypertension: Secondary | ICD-10-CM | POA: Diagnosis present

## 2018-03-26 DIAGNOSIS — Z90710 Acquired absence of both cervix and uterus: Secondary | ICD-10-CM

## 2018-03-26 DIAGNOSIS — S72141D Displaced intertrochanteric fracture of right femur, subsequent encounter for closed fracture with routine healing: Secondary | ICD-10-CM | POA: Diagnosis not present

## 2018-03-26 DIAGNOSIS — E785 Hyperlipidemia, unspecified: Secondary | ICD-10-CM | POA: Diagnosis present

## 2018-03-26 DIAGNOSIS — S299XXA Unspecified injury of thorax, initial encounter: Secondary | ICD-10-CM | POA: Diagnosis not present

## 2018-03-26 DIAGNOSIS — S72143A Displaced intertrochanteric fracture of unspecified femur, initial encounter for closed fracture: Secondary | ICD-10-CM

## 2018-03-26 DIAGNOSIS — M6281 Muscle weakness (generalized): Secondary | ICD-10-CM | POA: Diagnosis not present

## 2018-03-26 DIAGNOSIS — R69 Illness, unspecified: Secondary | ICD-10-CM | POA: Diagnosis not present

## 2018-03-26 DIAGNOSIS — F05 Delirium due to known physiological condition: Secondary | ICD-10-CM | POA: Diagnosis present

## 2018-03-26 DIAGNOSIS — F419 Anxiety disorder, unspecified: Secondary | ICD-10-CM | POA: Diagnosis present

## 2018-03-26 DIAGNOSIS — S79912A Unspecified injury of left hip, initial encounter: Secondary | ICD-10-CM | POA: Diagnosis not present

## 2018-03-26 DIAGNOSIS — R55 Syncope and collapse: Secondary | ICD-10-CM | POA: Diagnosis not present

## 2018-03-26 DIAGNOSIS — Z7189 Other specified counseling: Secondary | ICD-10-CM | POA: Diagnosis not present

## 2018-03-26 DIAGNOSIS — M25552 Pain in left hip: Secondary | ICD-10-CM | POA: Diagnosis not present

## 2018-03-26 DIAGNOSIS — F0150 Vascular dementia without behavioral disturbance: Secondary | ICD-10-CM | POA: Diagnosis not present

## 2018-03-26 LAB — CBC WITH DIFFERENTIAL/PLATELET
Basophils Absolute: 0 10*3/uL (ref 0.0–0.1)
Basophils Relative: 0 %
EOS PCT: 0 %
Eosinophils Absolute: 0 10*3/uL (ref 0.0–0.7)
HEMATOCRIT: 26.4 % — AB (ref 36.0–46.0)
HEMOGLOBIN: 8.5 g/dL — AB (ref 12.0–15.0)
LYMPHS ABS: 0.3 10*3/uL — AB (ref 0.7–4.0)
Lymphocytes Relative: 2 %
MCH: 27.2 pg (ref 26.0–34.0)
MCHC: 32.2 g/dL (ref 30.0–36.0)
MCV: 84.6 fL (ref 78.0–100.0)
Monocytes Absolute: 1.1 10*3/uL — ABNORMAL HIGH (ref 0.1–1.0)
Monocytes Relative: 7 %
NEUTROS ABS: 14.7 10*3/uL (ref 1.7–7.7)
Neutrophils Relative %: 91 %
Platelets: 155 10*3/uL (ref 150–400)
RBC: 3.12 MIL/uL — AB (ref 3.87–5.11)
RDW: 13.9 % (ref 11.5–15.5)
WBC: 16 10*3/uL — AB (ref 4.0–10.5)

## 2018-03-26 LAB — BASIC METABOLIC PANEL
Anion gap: 11 (ref 5–15)
BUN: 42 mg/dL — AB (ref 6–20)
CO2: 23 mmol/L (ref 22–32)
Calcium: 8.4 mg/dL — ABNORMAL LOW (ref 8.9–10.3)
Chloride: 107 mmol/L (ref 101–111)
Creatinine, Ser: 1.69 mg/dL — ABNORMAL HIGH (ref 0.44–1.00)
GFR calc Af Amer: 31 mL/min — ABNORMAL LOW (ref >60)
GFR calc non Af Amer: 26 mL/min — ABNORMAL LOW (ref >60)
GLUCOSE: 306 mg/dL — AB (ref 65–99)
POTASSIUM: 3.9 mmol/L (ref 3.5–5.1)
Sodium: 141 mmol/L (ref 135–145)

## 2018-03-26 LAB — PROTIME-INR
INR: 1.13
Prothrombin Time: 14.4 seconds (ref 11.4–15.2)

## 2018-03-26 MED ORDER — HEPARIN SODIUM (PORCINE) 5000 UNIT/ML IJ SOLN
5000.0000 [IU] | Freq: Three times a day (TID) | INTRAMUSCULAR | Status: DC
  Administered 2018-03-27: 5000 [IU] via SUBCUTANEOUS
  Filled 2018-03-26: qty 1

## 2018-03-26 MED ORDER — ALPRAZOLAM 0.25 MG PO TABS
0.2500 mg | ORAL_TABLET | Freq: Every day | ORAL | Status: DC
  Administered 2018-03-27 – 2018-04-01 (×7): 0.25 mg via ORAL
  Filled 2018-03-26 (×7): qty 1

## 2018-03-26 MED ORDER — LACTATED RINGERS IV SOLN
INTRAVENOUS | Status: DC
  Administered 2018-03-27: 02:00:00 via INTRAVENOUS

## 2018-03-26 MED ORDER — ONDANSETRON HCL 4 MG PO TABS: 4.0000 mg | ORAL_TABLET | Freq: Four times a day (QID) | ORAL | Status: DC | PRN

## 2018-03-26 MED ORDER — ACETAMINOPHEN 650 MG RE SUPP: 650.0000 mg | Freq: Four times a day (QID) | RECTAL | Status: DC | PRN

## 2018-03-26 MED ORDER — ONDANSETRON HCL 4 MG/2ML IJ SOLN
4.0000 mg | Freq: Four times a day (QID) | INTRAMUSCULAR | Status: DC | PRN
  Administered 2018-03-28: 4 mg via INTRAVENOUS
  Filled 2018-03-26: qty 2

## 2018-03-26 MED ORDER — MEMANTINE HCL ER 28 MG PO CP24
28.0000 mg | ORAL_CAPSULE | Freq: Every morning | ORAL | Status: DC
  Administered 2018-03-27 – 2018-04-02 (×6): 28 mg via ORAL
  Filled 2018-03-26 (×9): qty 1

## 2018-03-26 MED ORDER — LORAZEPAM 2 MG/ML IJ SOLN
0.5000 mg | INTRAMUSCULAR | Status: DC | PRN
  Administered 2018-03-30 (×2): 0.5 mg via INTRAVENOUS
  Filled 2018-03-26 (×2): qty 1

## 2018-03-26 MED ORDER — METOPROLOL TARTRATE 25 MG PO TABS
12.5000 mg | ORAL_TABLET | Freq: Two times a day (BID) | ORAL | Status: DC
Start: 2018-03-27 — End: 2018-04-02
  Administered 2018-03-27 – 2018-04-02 (×14): 12.5 mg via ORAL
  Filled 2018-03-26 (×14): qty 1

## 2018-03-26 MED ORDER — KETOROLAC TROMETHAMINE 15 MG/ML IJ SOLN: 15.0000 mg | Freq: Four times a day (QID) | INTRAMUSCULAR | Status: DC | PRN

## 2018-03-26 MED ORDER — INSULIN ASPART 100 UNIT/ML ~~LOC~~ SOLN
0.0000 [IU] | SUBCUTANEOUS | Status: DC
  Administered 2018-03-27 (×2): 5 [IU] via SUBCUTANEOUS
  Administered 2018-03-27: 2 [IU] via SUBCUTANEOUS
  Administered 2018-03-27: 1 [IU] via SUBCUTANEOUS
  Administered 2018-03-28: 2 [IU] via SUBCUTANEOUS
  Administered 2018-03-28 (×2): 3 [IU] via SUBCUTANEOUS
  Administered 2018-03-29 (×4): 2 [IU] via SUBCUTANEOUS
  Administered 2018-03-30: 3 [IU] via SUBCUTANEOUS
  Administered 2018-03-30: 5 [IU] via SUBCUTANEOUS
  Administered 2018-03-30: 3 [IU] via SUBCUTANEOUS
  Administered 2018-03-31 (×2): 2 [IU] via SUBCUTANEOUS
  Administered 2018-03-31: 1 [IU] via SUBCUTANEOUS
  Administered 2018-03-31: 3 [IU] via SUBCUTANEOUS
  Administered 2018-03-31: 5 [IU] via SUBCUTANEOUS

## 2018-03-26 MED ORDER — ACETAMINOPHEN 325 MG PO TABS
650.0000 mg | ORAL_TABLET | Freq: Four times a day (QID) | ORAL | Status: DC | PRN
Start: 2018-03-26 — End: 2018-04-02
  Administered 2018-04-01: 650 mg via ORAL
  Filled 2018-03-26: qty 2

## 2018-03-26 NOTE — ED Notes (Addendum)
Pt complaining of left hip and leg pain.  Pt's left leg is shorter than right leg and is rotated inward.

## 2018-03-26 NOTE — ED Triage Notes (Signed)
Pt was found in the floor at home and not sure how long she was down. Pt has shortening of the left leg and cannot bear weight on the leg.

## 2018-03-26 NOTE — ED Notes (Signed)
Pt calm and cooperative at this time  Family at bedside  Family reports to this nurse that patient has Dementia and has sun downers. Pt has been known to climb out of bed and take out tubes and IV lines.

## 2018-03-26 NOTE — ED Provider Notes (Signed)
[facility] EMERGENCY DEPARTMENT Provider Note   CSN: [id number] Arrival date & time: 03/26/18  1950     History   Chief Complaint Chief Complaint  Patient presents with  . Fall    HPI Betty [name] is a 84 y.o. female.  Level 5 caveat secondary to dementia.  Most of the history is been given by the patient's sons.  Patient's son said he went out to run some errands and when he came home he found her on the living room floor after an apparent fall.  Patient cannot recall in any details of the fall.  She is complaining of left hip pain.  Son states at baseline she is sometimes confused but ambulates independently although somewhat too quickly.  She does not use a cane or a walker.  Since the fall she is unable to ambulate.  She denies any head neck or back pain.  She denies any numbness or tingling in her extremities.  The history is provided by the patient and a relative.  Fall  This is a new problem. The current episode started 1 to 2 hours ago. The problem occurs constantly. The problem has not changed since onset.Pertinent negatives include no chest pain, no abdominal pain, no headaches and no shortness of breath. Associated symptoms comments: Left hip pain. The symptoms are aggravated by bending and twisting. The symptoms are relieved by position. She has tried nothing for the symptoms. The treatment provided no relief.    Past Medical History:  Diagnosis Date  . Chronic kidney disease    stage II  . Dementia   . Diabetes mellitus   . Hyperlipidemia   . Hypertension     Patient Active Problem List   Diagnosis Date Noted  . Mild protein malnutrition ([place]) 11/28/2017  . Syncope 01/11/2016  . UTI (urinary tract infection) 01/11/2016  . Diarrhea 12/02/2014  . Chronic insomnia 04/01/2013  . CKD (chronic kidney disease), stage III ([place]) 01/02/2013  . Dementia 09/23/2012  . Weight loss 09/23/2012  . Anxiety 06/21/2012  . Type II diabetes mellitus with renal  manifestations ([place]) 05/17/2012  . Essential hypertension, benign 05/17/2012  . Hyperlipidemia 05/17/2012    Past Surgical History:  Procedure Laterality Date  . ABDOMINAL HYSTERECTOMY    . BACK SURGERY       OB History   None      Home Medications    Prior to Admission medications   Medication Sig Start Date End Date Taking? Authorizing Provider  ALPRAZolam Duanne Moron) 0.25 MG tablet TAKE 1 TABLET BY MOUTH AT BEDTIME AS NEEDED 02/02/18  Yes [place], Modena Nunnery, MD  glipiZIDE (GLUCOTROL) 5 MG tablet take 1 tablet by mouth QD with Breakfast 11/02/17  Yes [place], Modena Nunnery, MD  hydrochlorothiazide (HYDRODIURIL) 12.5 MG tablet Take 1 tablet (12.5 mg total) by mouth daily. 10/26/17  Yes [place], Modena Nunnery, MD  losartan (COZAAR) 100 MG tablet TAKE 1 TABLET BY MOUTH EVERY DAY 02/28/18  Yes [place], Modena Nunnery, MD  metoprolol tartrate (LOPRESSOR) 25 MG tablet take 1 tablet by mouth twice a day with food for high blood pressure 10/26/17  Yes [place], Modena Nunnery, MD  NAMZARIC 28-10 MG CP24 TAKE 1 CAPSULE BY MOUTH ONCE DAILY(BEGIN AFTER TITRATION PACK IS COMPLETED 03/16/18  Yes [place], Modena Nunnery, MD  rosuvastatin (CRESTOR) 20 MG tablet take 1 tablet by mouth at bedtime FOR CHOLESTEROL 08/28/17  Yes [place], Lonell Grandchild F, MD  glucose blood test strip Use as instructed for once daily testing  With Easymax meter 12/31/12   [place], Modena Nunnery, MD  Lancets MISC Use as directed to monitor FSBS 2x daily. Dx: E11.9. 11/28/17   Alycia Rossetti, MD    Family History Family History  Problem Relation Age of Onset  . Hypertension Mother   . Cancer Father        colon   . Cancer Sister        Breast cancer  . Osteoporosis Sister     Social History Social History   Tobacco Use  . Smoking status: Never Smoker  . Smokeless tobacco: Never Used  Substance Use Topics  . Alcohol use: No  . Drug use: No     Allergies   Patient has no known allergies.   Review of Systems Review of Systems  Constitutional:  Negative for fever.  HENT: Negative for sore throat.   Eyes: Negative for visual disturbance.  Respiratory: Negative for shortness of breath.   Cardiovascular: Negative for chest pain.  Gastrointestinal: Negative for abdominal pain.  Genitourinary: Negative for dysuria.  Musculoskeletal: Negative for back pain and neck pain.  Skin: Negative for rash.  Neurological: Negative for headaches.  Psychiatric/Behavioral: Positive for confusion.     Physical Exam Updated Vital Signs BP (!) 168/52   Pulse 80   Temp 99.3 F (37.4 C) (Oral)   Resp 17   Wt 61.2 kg (135 lb)   SpO2 99%   BMI 25.51 kg/m   Physical Exam  Constitutional: She appears well-developed and well-nourished.  HENT:  Head: Normocephalic and atraumatic.  Right Ear: External ear normal.  Left Ear: External ear normal.  Nose: Nose normal.  Mouth/Throat: Oropharynx is clear and moist.  Eyes: Conjunctivae are normal.  Neck: Normal range of motion. Neck supple.  Cardiovascular: Normal rate, regular rhythm, normal heart sounds and intact distal pulses.  Pulmonary/Chest: Effort normal. She has no wheezes. She has no rales.  Abdominal: Soft. She exhibits no mass. There is no tenderness. There is no guarding.  Musculoskeletal: She exhibits no edema.  She is got some shortening and internal rotation of her left lower extremity.  She has diffuse tenderness about her left hip.  Nontender knee ankle and left lower extremity.  Full range of motion nontender upper extremities.  Pulses intact throughout.  Neurological: She is alert. She has normal strength. No sensory deficit. GCS eye subscore is 4. GCS verbal subscore is 5. GCS motor subscore is 6.  Skin: Skin is warm and dry.  Psychiatric: She has a normal mood and affect.     ED Treatments / Results  Labs (all labs ordered are listed, but only abnormal results are displayed) Labs Reviewed  BASIC METABOLIC PANEL - Abnormal; Notable for the following components:      Result  Value   Glucose, Bld 306 (*)    BUN 42 (*)    Creatinine, Ser 1.69 (*)    Calcium 8.4 (*)    GFR calc non Af Amer 26 (*)    GFR calc Af Amer 31 (*)    All other components within normal limits  CBC WITH DIFFERENTIAL/PLATELET - Abnormal; Notable for the following components:   WBC 16.0 (*)    RBC 3.12 (*)    Hemoglobin 8.5 (*)    HCT 26.4 (*)    Lymphs Abs 0.3 (*)    Monocytes Absolute 1.1 (*)    All other components within normal limits  PROTIME-INR    EKG None  Radiology Dg Chest 1 View  Result Date: 03/26/2018 CLINICAL DATA:  Unwitnessed fall at home.  Preop left hip fracture. EXAM: CHEST  1 VIEW COMPARISON:  01/11/2016 FINDINGS: Heart is top-normal in size. Moderate aortic atherosclerosis with ectasia. Clear lungs. No acute osseous abnormality. IMPRESSION: Aortic atherosclerosis.  No active pulmonary disease. Electronically Signed   By: Ashley Royalty M.D.   On: 03/26/2018 22:16   Dg Hip Unilat With Pelvis 2-3 Views Left  Result Date: 03/26/2018 CLINICAL DATA:  Unwitnessed fall at home today.  Pain. EXAM: DG HIP (WITH OR WITHOUT PELVIS) 2-3V LEFT COMPARISON:  None. FINDINGS: Acute, varus angulated intertrochanteric fracture of the left femur with avulsed lesser trochanter. Status post L4-5 lumbar fusion with hardware in place. No acute pelvic fracture. The native right hip appears intact. IMPRESSION: Acute, varus angulated intertrochanteric fracture of the left femur with avulsed lesser trochanter. Electronically Signed   By: Ashley Royalty M.D.   On: 03/26/2018 22:08    Procedures Procedures (including critical care time)  Medications Ordered in ED Medications - No data to display   Initial Impression / Assessment and Plan / ED Course  I have reviewed the triage vital signs and the nursing notes.  Pertinent labs & imaging results that were available during my care of the patient were reviewed by me and considered in my medical decision making (see chart for  details).  Clinical Course as of Mar 27 2307  Mon Mar 26, 2018  2239 Discussed with Dr. [name] from orthopedics.  He recommends keeping the patient n.p.o. after midnight and admit to medicine.  He is try to get her on scheduled for tomorrow but it might need to be bumped until Wednesday.  I updated the family regarding these results.  1 of the sons wanted to make sure that admitting team is   [MB]  2258 EKG is normal sinus rhythm rate of 78 possible late R wave progression normal intervals no acute ST-T changes.   [MB]    Clinical Course User Index [MB] Hayden Rasmussen, MD     Final Clinical Impressions(s) / ED Diagnoses   Final diagnoses:  Closed left hip fracture, initial encounter [facility])    ED Discharge Orders    None       Hayden Rasmussen, MD 03/28/18 [id number]

## 2018-03-26 NOTE — H&P (Addendum)
History and Physical    SEMAJ KHAM WUJ:[id number] DOB: 1930-11-21 DOA: 03/26/2018  PCP: Alycia Rossetti, MD   Patient coming from: Home  Chief Complaint: Fall with left hip pain  HPI: Betty [name] is a 84 y.o. female with medical history significant for dementia with sundowning, CKD stage III, type 2 diabetes, dyslipidemia, and hypertension who was brought to the emergency department after being found down at home.  Patient is slightly confused, but states that she tripped on some close and carpeting on her floor.  She denies any lightheadedness or dizziness.  Family members found her on the living room floor sometime afterwards.  Patient cannot recall how long she has been on the floor.  She does live with her son at home.  She denies any head or neck pain and does not recall hitting her head.   ED Course: Vital signs are stable and laboratory data indicates leukocytosis of 16,000 and hemoglobin 8.5.  No overt bleeding noted.  BUN 42 and creatinine 1.69 which is stable per her baseline.  Glucose is noted to be 306.  Review of Systems: Cannot be fully obtained due to patient condition.  Past Medical History:  Diagnosis Date  . Chronic kidney disease    stage II  . Dementia   . Diabetes mellitus   . Hyperlipidemia   . Hypertension     Past Surgical History:  Procedure Laterality Date  . ABDOMINAL HYSTERECTOMY    . BACK SURGERY       reports that she has never smoked. She has never used smokeless tobacco. She reports that she does not drink alcohol or use drugs.  No Known Allergies  Family History  Problem Relation Age of Onset  . Hypertension Mother   . Cancer Father        colon   . Cancer Sister        Breast cancer  . Osteoporosis Sister     Prior to Admission medications   Medication Sig Start Date End Date Taking? Authorizing Provider  ALPRAZolam Duanne Moron) 0.25 MG tablet TAKE 1 TABLET BY MOUTH AT BEDTIME AS NEEDED 02/02/18  Yes Remy, Modena Nunnery, MD   glipiZIDE (GLUCOTROL) 5 MG tablet take 1 tablet by mouth QD with Breakfast 11/02/17  Yes [place], Modena Nunnery, MD  hydrochlorothiazide (HYDRODIURIL) 12.5 MG tablet Take 1 tablet (12.5 mg total) by mouth daily. 10/26/17  Yes [place], Modena Nunnery, MD  metoprolol tartrate (LOPRESSOR) 25 MG tablet take 1 tablet by mouth twice a day with food for high blood pressure 10/26/17  Yes [place], Modena Nunnery, MD  NAMZARIC 28-10 MG CP24 TAKE 1 CAPSULE BY MOUTH ONCE DAILY(BEGIN AFTER TITRATION PACK IS COMPLETED 03/16/18  Yes [place], Modena Nunnery, MD  rosuvastatin (CRESTOR) 20 MG tablet take 1 tablet by mouth at bedtime FOR CHOLESTEROL 08/28/17  Yes , Lonell Grandchild F, MD  glucose blood test strip Use as instructed for once daily testing  With Easymax meter 12/31/12   Alycia Rossetti, MD  Lancets MISC Use as directed to monitor FSBS 2x daily. Dx: E11.9. 11/28/17   Alycia Rossetti, MD  losartan (COZAAR) 100 MG tablet TAKE 1 TABLET BY MOUTH EVERY DAY Patient not taking: Reported on 03/26/2018 02/28/18   Alycia Rossetti, MD    Physical Exam: Vitals:   03/26/18 1951 03/26/18 1954 03/26/18 2000 03/26/18 2003  BP: (!) 163/65  (!) 168/52   Pulse: 79  80   Resp:   17   Temp: 99.3 F (  37.4 C)     TempSrc: Oral     SpO2: 95%  99% 99%  Weight:  61.2 kg (135 lb)      Constitutional: NAD, calm, comfortable, mildly confused Vitals:   03/26/18 1951 03/26/18 1954 03/26/18 2000 03/26/18 2003  BP: (!) 163/65  (!) 168/52   Pulse: 79  80   Resp:   17   Temp: 99.3 F (37.4 C)     TempSrc: Oral     SpO2: 95%  99% 99%  Weight:  61.2 kg (135 lb)     Eyes: lids and conjunctivae normal ENMT: Mucous membranes are moist.  Neck: normal, supple Respiratory: clear to auscultation bilaterally. Normal respiratory effort. No accessory muscle use.  Cardiovascular: Regular rate and rhythm, no murmurs. No extremity edema. Abdomen: no tenderness, no distention. Bowel sounds positive.  Musculoskeletal:  No joint deformity upper and lower  extremities.  L hip tenderness to palpation. Skin: no rashes, lesions, ulcers.   Labs on Admission: I have personally reviewed following labs and imaging studies  CBC: Recent Labs  Lab 03/26/18 2059  WBC 16.0*  NEUTROABS 14.7  HGB 8.5*  HCT 26.4*  MCV 84.6  PLT 782   Basic Metabolic Panel: Recent Labs  Lab 03/26/18 2059  NA 141  K 3.9  CL 107  CO2 23  GLUCOSE 306*  BUN 42*  CREATININE 1.69*  CALCIUM 8.4*   GFR: Estimated Creatinine Clearance: 20.4 mL/min (A) (by C-G formula based on SCr of 1.69 mg/dL (H)). Liver Function Tests: No results for input(s): AST, ALT, ALKPHOS, BILITOT, PROT, ALBUMIN in the last 168 hours. No results for input(s): LIPASE, AMYLASE in the last 168 hours. No results for input(s): AMMONIA in the last 168 hours. Coagulation Profile: Recent Labs  Lab 03/26/18 2059  INR 1.13   Cardiac Enzymes: No results for input(s): CKTOTAL, CKMB, CKMBINDEX, TROPONINI in the last 168 hours. BNP (last 3 results) No results for input(s): PROBNP in the last 8760 hours. HbA1C: No results for input(s): HGBA1C in the last 72 hours. CBG: No results for input(s): GLUCAP in the last 168 hours. Lipid Profile: No results for input(s): CHOL, HDL, LDLCALC, TRIG, CHOLHDL, LDLDIRECT in the last 72 hours. Thyroid Function Tests: No results for input(s): TSH, T4TOTAL, FREET4, T3FREE, THYROIDAB in the last 72 hours. Anemia Panel: No results for input(s): VITAMINB12, FOLATE, FERRITIN, TIBC, IRON, RETICCTPCT in the last 72 hours. Urine analysis:    Component Value Date/Time   COLORURINE YELLOW 01/11/2016 [place] 01/11/2016 0931   LABSPEC 1.020 01/11/2016 0931   PHURINE 6.0 01/11/2016 0931   GLUCOSEU NEGATIVE 01/11/2016 0931   HGBUR TRACE (A) 01/11/2016 0931   BILIRUBINUR NEGATIVE 01/11/2016 0931   KETONESUR TRACE (A) 01/11/2016 0931   PROTEINUR TRACE (A) 01/11/2016 0931   UROBILINOGEN 0.2 07/04/2014 0906   NITRITE NEGATIVE 01/11/2016 0931    LEUKOCYTESUR MODERATE (A) 01/11/2016 0931    Radiological Exams on Admission: Dg Chest 1 View  Result Date: 03/26/2018 CLINICAL DATA:  Unwitnessed fall at home.  Preop left hip fracture. EXAM: CHEST  1 VIEW COMPARISON:  01/11/2016 FINDINGS: Heart is top-normal in size. Moderate aortic atherosclerosis with ectasia. Clear lungs. No acute osseous abnormality. IMPRESSION: Aortic atherosclerosis.  No active pulmonary disease. Electronically Signed   By: Ashley Royalty M.D.   On: 03/26/2018 22:16   Dg Hip Unilat With Pelvis 2-3 Views Left  Result Date: 03/26/2018 CLINICAL DATA:  Unwitnessed fall at home today.  Pain. EXAM: DG HIP (WITH  OR WITHOUT PELVIS) 2-3V LEFT COMPARISON:  None. FINDINGS: Acute, varus angulated intertrochanteric fracture of the left femur with avulsed lesser trochanter. Status post L4-5 lumbar fusion with hardware in place. No acute pelvic fracture. The native right hip appears intact. IMPRESSION: Acute, varus angulated intertrochanteric fracture of the left femur with avulsed lesser trochanter. Electronically Signed   By: Ashley Royalty M.D.   On: 03/26/2018 22:08   EKG: SR at 79bpm.   Assessment/Plan Principal Problem:   Hip fracture (HCC) Active Problems:   Type II diabetes mellitus with renal manifestations (HCC)   Essential hypertension, benign   Hyperlipidemia   Anxiety   Dementia   CKD (chronic kidney disease), stage III ([place])    1. Left, closed intertrochanteric hip fracture varus angulation status post suspected mechanical fall.  Evaluation per orthopedics in a.m.  N.p.o. except medications and maintain on gentle IV fluid, time-limited.  Heparin for DVT prophylaxis.  Pain management with Toradol as needed.  Fall precautions. 2. Dyslipidemia.  Hold statin and check CK level. 3. Type 2 diabetes with hyperglycemia.  Maintain on sliding scale insulin with every 4 hour Accu-Cheks. Hold home meds. 4. Dementia with anxiety and sundowning.  Xanax at bedtime with Ativan as  needed for severe agitation. Continue home Namzaric. 5. CKD stage III appears to be at baseline.  Repeat renal panel in a.m. 6. Hypertension.  Hold home blood pressure medications due to soft blood pressure readings currently.  Last blood pressure was approximately 90 systolic and has not been recorded in the chart as of yet.   DVT prophylaxis: Heparin Code Status: Full Family Communication: Daughter and granddaughter at bedside (nurse) Disposition Plan: Evaluation of hip fracture by orthopedics in a.m. Consults called: Dr. [name] is aware, called by EDP Admission status: Inpatient, [facility] Pager [phone]  If 7PM-7AM, please contact night-coverage [email or web address] Password [facility]  03/26/2018, 11:38 PM

## 2018-03-26 NOTE — ED Notes (Signed)
Pt moved to room closer to nurse's station for closer observation

## 2018-03-27 DIAGNOSIS — E1122 Type 2 diabetes mellitus with diabetic chronic kidney disease: Secondary | ICD-10-CM

## 2018-03-27 DIAGNOSIS — S72141P Displaced intertrochanteric fracture of right femur, subsequent encounter for closed fracture with malunion: Secondary | ICD-10-CM

## 2018-03-27 DIAGNOSIS — S72143A Displaced intertrochanteric fracture of unspecified femur, initial encounter for closed fracture: Secondary | ICD-10-CM

## 2018-03-27 DIAGNOSIS — S72141D Displaced intertrochanteric fracture of right femur, subsequent encounter for closed fracture with routine healing: Secondary | ICD-10-CM

## 2018-03-27 DIAGNOSIS — F0390 Unspecified dementia without behavioral disturbance: Secondary | ICD-10-CM

## 2018-03-27 HISTORY — DX: Displaced intertrochanteric fracture of unspecified femur, initial encounter for closed fracture: S72.143A

## 2018-03-27 LAB — GLUCOSE, CAPILLARY
GLUCOSE-CAPILLARY: 132 mg/dL — AB (ref 70–99)
Glucose-Capillary: 144 mg/dL — ABNORMAL HIGH (ref 70–99)
Glucose-Capillary: 145 mg/dL — ABNORMAL HIGH (ref 70–99)
Glucose-Capillary: 147 mg/dL — ABNORMAL HIGH (ref 70–99)
Glucose-Capillary: 166 mg/dL — ABNORMAL HIGH (ref 70–99)
Glucose-Capillary: 253 mg/dL — ABNORMAL HIGH (ref 70–99)

## 2018-03-27 LAB — SURGICAL PCR SCREEN
MRSA, PCR: NEGATIVE
STAPHYLOCOCCUS AUREUS: NEGATIVE

## 2018-03-27 LAB — ABO/RH: ABO/RH(D): A POS

## 2018-03-27 LAB — BASIC METABOLIC PANEL
Anion gap: 12 (ref 5–15)
BUN: 43 mg/dL — AB (ref 8–23)
CO2: 24 mmol/L (ref 22–32)
CREATININE: 1.75 mg/dL — AB (ref 0.44–1.00)
Calcium: 8.5 mg/dL — ABNORMAL LOW (ref 8.9–10.3)
Chloride: 108 mmol/L (ref 98–111)
GFR calc Af Amer: 29 mL/min — ABNORMAL LOW (ref >60)
GFR, EST NON AFRICAN AMERICAN: 25 mL/min — AB (ref >60)
GLUCOSE: 140 mg/dL — AB (ref 70–99)
Potassium: 3.5 mmol/L (ref 3.5–5.1)
SODIUM: 144 mmol/L (ref 135–145)

## 2018-03-27 LAB — CBC
HEMATOCRIT: 25.7 % — AB (ref 36.0–46.0)
Hemoglobin: 8.3 g/dL — ABNORMAL LOW (ref 12.0–15.0)
MCH: 27.2 pg (ref 26.0–34.0)
MCHC: 32.3 g/dL (ref 30.0–36.0)
MCV: 84.3 fL (ref 78.0–100.0)
Platelets: 162 10*3/uL (ref 150–400)
RBC: 3.05 MIL/uL — ABNORMAL LOW (ref 3.87–5.11)
RDW: 14.2 % (ref 11.5–15.5)
WBC: 12.4 10*3/uL — AB (ref 4.0–10.5)

## 2018-03-27 LAB — CBG MONITORING, ED: Glucose-Capillary: 261 mg/dL — ABNORMAL HIGH (ref 70–99)

## 2018-03-27 LAB — CK: CK TOTAL: 274 U/L — AB (ref 38–234)

## 2018-03-27 LAB — PREPARE RBC (CROSSMATCH)

## 2018-03-27 MED ORDER — POVIDONE-IODINE 10 % EX SWAB
2.0000 "application " | Freq: Once | CUTANEOUS | Status: AC
  Administered 2018-03-28: 2 via TOPICAL

## 2018-03-27 MED ORDER — SODIUM CHLORIDE 0.9% IV SOLUTION
Freq: Once | INTRAVENOUS | Status: AC
  Administered 2018-03-27: 12:00:00 via INTRAVENOUS

## 2018-03-27 MED ORDER — SENNA 8.6 MG PO TABS
2.0000 | ORAL_TABLET | Freq: Every day | ORAL | Status: DC
  Administered 2018-03-27 – 2018-03-31 (×4): 17.2 mg via ORAL
  Filled 2018-03-27 (×5): qty 2

## 2018-03-27 MED ORDER — PHENOL 1.4 % MT LIQD: 1.0000 | OROMUCOSAL | Status: DC | PRN

## 2018-03-27 MED ORDER — TRAMADOL HCL 50 MG PO TABS
50.0000 mg | ORAL_TABLET | Freq: Four times a day (QID) | ORAL | Status: DC | PRN
  Administered 2018-03-27 – 2018-03-29 (×4): 50 mg via ORAL
  Filled 2018-03-27 (×4): qty 1

## 2018-03-27 MED ORDER — CEFAZOLIN SODIUM-DEXTROSE 2-4 GM/100ML-% IV SOLN
2.0000 g | INTRAVENOUS | Status: AC
  Administered 2018-03-28: 2 g via INTRAVENOUS
  Filled 2018-03-27: qty 100

## 2018-03-27 MED ORDER — POTASSIUM CHLORIDE IN NACL 20-0.45 MEQ/L-% IV SOLN
INTRAVENOUS | Status: DC
  Administered 2018-03-27 – 2018-04-01 (×9): via INTRAVENOUS
  Filled 2018-03-27 (×15): qty 1000

## 2018-03-27 MED ORDER — CHLORHEXIDINE GLUCONATE 4 % EX LIQD
60.0000 mL | Freq: Once | CUTANEOUS | Status: AC
  Administered 2018-03-28: 4 via TOPICAL
  Filled 2018-03-27: qty 15

## 2018-03-27 MED ORDER — POLYETHYLENE GLYCOL 3350 17 G PO PACK
17.0000 g | PACK | Freq: Every day | ORAL | Status: DC
  Administered 2018-03-27 – 2018-03-31 (×4): 17 g via ORAL
  Filled 2018-03-27 (×5): qty 1

## 2018-03-27 MED ORDER — DONEPEZIL HCL 5 MG PO TABS
10.0000 mg | ORAL_TABLET | Freq: Every day | ORAL | Status: DC
  Administered 2018-03-27 – 2018-04-02 (×6): 10 mg via ORAL
  Filled 2018-03-27 (×6): qty 2

## 2018-03-27 NOTE — Consult Note (Signed)
Reason for Consult: Left hip fracture Referring Physician: [name] is an 84 y.o. female.  HPI: 84 year old female who could not give me a history she has dementia I took the history from the ER doctor and the medical record and the family.  Apparently the patient has been having some balance and walking issues but was ambulatory without any assistive devices she is been developing dementia for several years worsening in the last few months.  She was home alone and when her son came back from going out for a minute or 2 he found her down unwitnessed fall.  She has a fractured left hip inotrope fracture she cannot walk she complains of pain some spasming in the left hip some pain in the left ankle.  She was injured and brought to the hospital in June 24  Past Medical History:  Diagnosis Date  . Chronic kidney disease    stage II  . Dementia   . Diabetes mellitus   . Hyperlipidemia   . Hypertension     Past Surgical History:  Procedure Laterality Date  . ABDOMINAL HYSTERECTOMY    . BACK SURGERY      Family History  Problem Relation Age of Onset  . Hypertension Mother   . Cancer Father        colon   . Cancer Sister        Breast cancer  . Osteoporosis Sister     Social History:  reports that she has never smoked. She has never used smokeless tobacco. She reports that she does not drink alcohol or use drugs.  Allergies: No Known Allergies  Medications: I have reviewed the patient's current medications.  Results for orders placed or performed during the hospital encounter of 03/26/18 (from the past 48 hour(s))  Basic metabolic panel     Status: Abnormal   Collection Time: 03/26/18  8:59 PM  Result Value Ref Range   Sodium 141 135 - 145 mmol/L   Potassium 3.9 3.5 - 5.1 mmol/L   Chloride 107 101 - 111 mmol/L   CO2 23 22 - 32 mmol/L   Glucose, Bld 306 (H) 65 - 99 mg/dL   BUN 42 (H) 6 - 20 mg/dL   Creatinine, Ser 1.69 (H) 0.44 - 1.00 mg/dL   Calcium 8.4  (L) 8.9 - 10.3 mg/dL   GFR calc non Af Amer 26 (L) >60 mL/min   GFR calc Af Amer 31 (L) >60 mL/min    Comment: (NOTE) The eGFR has been calculated using the CKD EPI equation. This calculation has not been validated in all clinical situations. eGFR's persistently <60 mL/min signify possible Chronic Kidney Disease.    Anion gap 11 5 - 15    Comment: Performed at [facility], [address], [place], [place] [address]  CBC with Differential     Status: Abnormal   Collection Time: 03/26/18  8:59 PM  Result Value Ref Range   WBC 16.0 (H) 4.0 - 10.5 K/uL    Comment: WHITE COUNT CONFIRMED ON SMEAR   RBC 3.12 (L) 3.87 - 5.11 MIL/uL   Hemoglobin 8.5 (L) 12.0 - 15.0 g/dL   HCT 26.4 (L) 36.0 - 46.0 %   MCV 84.6 78.0 - 100.0 fL   MCH 27.2 26.0 - 34.0 pg   MCHC 32.2 30.0 - 36.0 g/dL   RDW 13.9 11.5 - 15.5 %   Platelets 155 150 - 400 K/uL   Neutrophils Relative % 91 %  Neutro Abs 14.7 1.7 - 7.7 K/uL   Lymphocytes Relative 2 %   Lymphs Abs 0.3 (L) 0.7 - 4.0 K/uL   Monocytes Relative 7 %   Monocytes Absolute 1.1 (H) 0.1 - 1.0 K/uL   Eosinophils Relative 0 %   Eosinophils Absolute 0.0 0.0 - 0.7 K/uL   Basophils Relative 0 %   Basophils Absolute 0.0 0.0 - 0.1 K/uL    Comment: Performed at [facility], [address] [place], [place] [address]  Protime-INR     Status: None   Collection Time: 03/26/18  8:59 PM  Result Value Ref Range   Prothrombin Time 14.4 11.4 - 15.2 seconds   INR 1.13     Comment: Performed at [facility], [address], [place], [place] [address]  CK     Status: Abnormal   Collection Time: 03/26/18  8:59 PM  Result Value Ref Range   Total CK 274 (H) 38 - 234 U/L    Comment: Performed at [facility], [address], [place], [place] [address]  CBG monitoring, ED     Status: Abnormal   Collection Time: 03/27/18 12:23 AM  Result Value Ref Range   Glucose-Capillary 261 (H) 70 - 99 mg/dL  Glucose, capillary     Status: Abnormal   Collection Time: 03/27/18  4:34 AM   Result Value Ref Range   Glucose-Capillary 144 (H) 70 - 99 mg/dL  Basic metabolic panel     Status: Abnormal   Collection Time: 03/27/18  5:56 AM  Result Value Ref Range   Sodium 144 135 - 145 mmol/L   Potassium 3.5 3.5 - 5.1 mmol/L   Chloride 108 98 - 111 mmol/L   CO2 24 22 - 32 mmol/L   Glucose, Bld 140 (H) 70 - 99 mg/dL   BUN 43 (H) 8 - 23 mg/dL   Creatinine, Ser 1.75 (H) 0.44 - 1.00 mg/dL   Calcium 8.5 (L) 8.9 - 10.3 mg/dL   GFR calc non Af Amer 25 (L) >60 mL/min   GFR calc Af Amer 29 (L) >60 mL/min    Comment: (NOTE) The eGFR has been calculated using the CKD EPI equation. This calculation has not been validated in all clinical situations. eGFR's persistently <60 mL/min signify possible Chronic Kidney Disease.    Anion gap 12 5 - 15    Comment: Performed at [facility], [address] [place], [place] [address]  CBC     Status: Abnormal   Collection Time: 03/27/18  5:56 AM  Result Value Ref Range   WBC 12.4 (H) 4.0 - 10.5 K/uL   RBC 3.05 (L) 3.87 - 5.11 MIL/uL   Hemoglobin 8.3 (L) 12.0 - 15.0 g/dL   HCT 25.7 (L) 36.0 - 46.0 %   MCV 84.3 78.0 - 100.0 fL   MCH 27.2 26.0 - 34.0 pg   MCHC 32.3 30.0 - 36.0 g/dL   RDW 14.2 11.5 - 15.5 %   Platelets 162 150 - 400 K/uL    Comment: Performed at [facility], [address] [place], [place] [address]  Glucose, capillary     Status: Abnormal   Collection Time: 03/27/18  7:39 AM  Result Value Ref Range   Glucose-Capillary 132 (H) 70 - 99 mg/dL  Type and screen [facility]     Status: None (Preliminary result)   Collection Time: 03/27/18  8:33 AM  Result Value Ref Range   ABO/RH(D) A POS    Antibody Screen NEG  Sample Expiration 03/30/2018    Unit Number [id number]    Blood Component Type RED CELLS,LR    Unit division 00    Status of Unit ISSUED    Transfusion Status OK TO TRANSFUSE    Crossmatch Result      Compatible Performed at [facility], [address], [place], [place] [address]    Unit Number  [id number]    Blood Component Type RED CELLS,LR    Unit division 00    Status of Unit ISSUED    Transfusion Status OK TO TRANSFUSE    Crossmatch Result Compatible   Prepare RBC     Status: None   Collection Time: 03/27/18  8:33 AM  Result Value Ref Range   Order Confirmation      ORDER PROCESSED BY BLOOD BANK Performed at [facility], [address] [place], [place] [address]   ABO/Rh     Status: None   Collection Time: 03/27/18  8:33 AM  Result Value Ref Range   ABO/RH(D)      A POS Performed at [facility], [address], [place], [place] [address]   Surgical pcr screen     Status: None   Collection Time: 03/27/18 10:59 AM  Result Value Ref Range   MRSA, PCR NEGATIVE NEGATIVE   Staphylococcus aureus NEGATIVE NEGATIVE    Comment: (NOTE) The Xpert SA Assay (FDA approved for NASAL specimens in patients 55 years of age and older), is one component of a comprehensive surveillance program. It is not intended to diagnose infection nor to guide or monitor treatment. Performed at [facility], [address] [place], [place] [address]   Glucose, capillary     Status: Abnormal   Collection Time: 03/27/18 11:24 AM  Result Value Ref Range   Glucose-Capillary 253 (H) 70 - 99 mg/dL  Glucose, capillary     Status: Abnormal   Collection Time: 03/27/18  4:20 PM  Result Value Ref Range   Glucose-Capillary 166 (H) 70 - 99 mg/dL    Dg Chest 1 View  Result Date: 03/26/2018 CLINICAL DATA:  Unwitnessed fall at home.  Preop left hip fracture. EXAM: CHEST  1 VIEW COMPARISON:  01/11/2016 FINDINGS: Heart is top-normal in size. Moderate aortic atherosclerosis with ectasia. Clear lungs. No acute osseous abnormality. IMPRESSION: Aortic atherosclerosis.  No active pulmonary disease. Electronically Signed   By: Ashley Royalty M.D.   On: 03/26/2018 22:16   Dg Hip Unilat With Pelvis 2-3 Views Left  Result Date: 03/26/2018 CLINICAL DATA:  Unwitnessed fall at home today.  Pain. EXAM: DG HIP (WITH OR  WITHOUT PELVIS) 2-3V LEFT COMPARISON:  None. FINDINGS: Acute, varus angulated intertrochanteric fracture of the left femur with avulsed lesser trochanter. Status post L4-5 lumbar fusion with hardware in place. No acute pelvic fracture. The native right hip appears intact. IMPRESSION: Acute, varus angulated intertrochanteric fracture of the left femur with avulsed lesser trochanter. Electronically Signed   By: Ashley Royalty M.D.   On: 03/26/2018 22:08    ROS Blood pressure (!) 143/55, pulse 65, temperature 98.6 F (37 C), temperature source Oral, resp. rate 17, height 5' 1"  (1.549 m), weight 125 lb 14.1 oz (57.1 kg), SpO2 100 %. Physical Exam  Constitutional: She appears well-developed and well-nourished. No distress.  HENT:  Head: Normocephalic and atraumatic.  Right Ear: External ear normal.  Left Ear: External ear normal.  Nose: Nose normal.  Mouth/Throat: No oropharyngeal exudate.  Eyes: Pupils are equal, round, and reactive to light. Conjunctivae and EOM  are normal. Right eye exhibits no discharge. Left eye exhibits no discharge. No scleral icterus.  Neck: Normal range of motion. Neck supple. No JVD present. No tracheal deviation present. No thyromegaly present.  Cardiovascular: Normal rate, normal heart sounds and intact distal pulses.  Respiratory: Effort normal. No respiratory distress. She has no wheezes. She exhibits no tenderness.  GI: Soft. Bowel sounds are normal. She exhibits no distension.  Musculoskeletal:  Right and left upper extremity  alignment normal Range of motion no contracture Motor muscle tone normal no tremor no atrophy All joints reduced without subluxation Skin normal Pulses normal Sensation normal   Lymphadenopathy:       Right cervical: No superficial cervical adenopathy present.      Left cervical: No superficial cervical adenopathy present.  Neurological: She is alert. She has normal reflexes. She is disoriented. She displays no atrophy, no tremor and  normal reflexes. No cranial nerve deficit. She exhibits normal muscle tone. She displays no seizure activity. Gait abnormal. Coordination normal.  Skin: Skin is warm and dry. No rash noted. She is not diaphoretic. No erythema.  Psychiatric: She has a normal mood and affect. Her behavior is normal. Judgment and thought content normal.    Right lower extremity Alignment normal Range of motion no contracture No subluxations or dislocations Motor no atrophy or tremor Skin normal Pulse and perfusion normal Sensation normal  Left lower extremity is externally rotated and short it is tender in the proximal femur at the range of motion is deferred because of fractured hip the knee and ankle are reduced there is no atrophy or tremor skin is intact pulse and perfusion are normal and sensation is normal   Assessment/Plan: #1 anemia #2 dementia #3 intertrochanteric fracture left hip 3 part  The patient will need blood transfusion to improve her hemoglobin.  Her dementia will be a confounding variable in terms of her recovery.  She needs to have stabilization of the fracture I choose to use a gamma nail intramedullary device.  Her hemoglobin will be checked after 2 units of blood and will plan on doing surgery on June 26.  Planned procedure Internal fixation left hip with gamma nail  The procedure has been fully reviewed with the patient; The risks and benefits of surgery have been discussed and explained and understood. Alternative treatment has also been reviewed, questions were encouraged and answered. The postoperative plan is also been reviewed.   Betty [name] 03/27/2018, 5:17 PM

## 2018-03-27 NOTE — Progress Notes (Addendum)
PROGRESS NOTE  Betty [name] IDP:[id number] DOB: Feb 15, 1931 DOA: 03/26/2018 PCP: Alycia Rossetti, MD  Brief History:  84 year old female with a history of dementia, CKD stage III, diabetes mellitus, hyperlipidemia, hypertension presenting after being found down at home.  The patient lives with her son.  He went to out to run some errands, and when he came home he found the patient on the living room floor from a suspected mechanical fall.  The patient cannot provide any details regarding the fall.  The patient has significant left hip pain and was not able to get up.  At baseline, the patient is able to ambulate independently without a cane or walker.  The family states that she normally has poor p.o. intake, but otherwise the patient had not been complaining of any chest discomfort, shortness breath, vomiting or diarrhea, headaches.  Upon presentation to emergency department, x-rays revealed an acute varus angulated intertrochanteric left femur fracture.  BMP showed a serum creatinine 1.69 with WBC 16.0.  Orthopedics was consulted to assist with management.  Assessment/Plan: Closed left intertrochanteric femur fracture -Appreciate orthopedics consult -Plans noted for surgery 03/28/2018 -Pain control -DVT prophylaxis per Ortho -We will likely need skilled nursing facility -PRBC transfusion per Dr. [name] -personally reviewed CXR--no infiltrates or edema  CKD stage III -Discontinue Toradol as the patient's serum creatinine is gradually rising -Baseline creatinine 1.2-1.5 -Judicious IV fluids--1/2NS with KCl -am BMP  Essential hypertension -Continue metoprolol succinate -Holding losartan and HCTZ secondary to increasing serum creatinine  Diabetes mellitus type 2 -Hemoglobin A1c -NovoLog sliding scale -Holding glipizide -10/27/2017 hemoglobin A1c 7.9  Dementia with behavioral disturbance -Continue Aricept and Namenda  Hyperlipidemia -Holding Crestor secondary to  elevated CPK -Repeat CPK in the a.m.  Anxiety -Continue home dose alprazolam   Disposition Plan:   SNF after surgery Family Communication:   Daughter updated at bedside 6/25  Consultants:  Ortho--Hence  Code Status:  FUL  DVT Prophylaxis:  [facility] Heparin   Procedures: As Listed in Progress Note Above  Antibiotics: None    Subjective: Patient denies fevers, chills, headache, chest pain, dyspnea, nausea, vomiting, diarrhea, abdominal pain, dysuria, hematuria, hematochezia, and melena.   Objective: Vitals:   03/26/18 2003 03/26/18 2200 03/27/18 0128 03/27/18 0600  BP:   (!) 128/54 (!) 130/49  Pulse:  71 71 72  Resp:  18 19 15   Temp:   99.3 F (37.4 C) 99.6 F (37.6 C)  TempSrc:      SpO2: 99% 99% 99% 100%  Weight:   57.1 kg (125 lb 14.1 oz)     Intake/Output Summary (Last 24 hours) at 03/27/2018 0830 Last data filed at 03/27/2018 0511 Gross per 24 hour  Intake 253.75 ml  Output -  Net 253.75 ml   Weight change:  Exam:   General:  Pt is alert, follows commands appropriately, not in acute distress  HEENT: No icterus, No thrush, No neck mass, [place]/AT  Cardiovascular: RRR, S1/S2, no rubs, no gallops  Respiratory: Fine bibasilar crackles but no wheeze.  Abdomen: Soft/+BS, non tender, non distended, no guarding  Extremities: No edema, No lymphangitis, No petechiae, No rashes, no synovitis   Data Reviewed: I have personally reviewed following labs and imaging studies Basic Metabolic Panel: Recent Labs  Lab 03/26/18 2059 03/27/18 0556  NA 141 144  K 3.9 3.5  CL 107 108  CO2 23 24  GLUCOSE 306* 140*  BUN 42* 43*  CREATININE 1.69* 1.75*  CALCIUM  8.4* 8.5*   Liver Function Tests: No results for input(s): AST, ALT, ALKPHOS, BILITOT, PROT, ALBUMIN in the last 168 hours. No results for input(s): LIPASE, AMYLASE in the last 168 hours. No results for input(s): AMMONIA in the last 168 hours. Coagulation Profile: Recent Labs  Lab 03/26/18 2059  INR  1.13   CBC: Recent Labs  Lab 03/26/18 2059 03/27/18 0556  WBC 16.0* 12.4*  NEUTROABS 14.7  --   HGB 8.5* 8.3*  HCT 26.4* 25.7*  MCV 84.6 84.3  PLT 155 162   Cardiac Enzymes: Recent Labs  Lab 03/26/18 2059  CKTOTAL 274*   BNP: Invalid input(s): POCBNP CBG: Recent Labs  Lab 03/27/18 0023 03/27/18 0434 03/27/18 0739  GLUCAP 261* 144* 132*   HbA1C: No results for input(s): HGBA1C in the last 72 hours. Urine analysis:    Component Value Date/Time   COLORURINE YELLOW 01/11/2016 [place] 01/11/2016 0931   LABSPEC 1.020 01/11/2016 0931   PHURINE 6.0 01/11/2016 0931   GLUCOSEU NEGATIVE 01/11/2016 0931   HGBUR TRACE (A) 01/11/2016 0931   BILIRUBINUR NEGATIVE 01/11/2016 0931   KETONESUR TRACE (A) 01/11/2016 0931   PROTEINUR TRACE (A) 01/11/2016 0931   UROBILINOGEN 0.2 07/04/2014 0906   NITRITE NEGATIVE 01/11/2016 0931   LEUKOCYTESUR MODERATE (A) 01/11/2016 0931   Sepsis Labs: @LABRCNTIP (procalcitonin:4,lacticidven:4) )No results found for this or any previous visit (from the past 240 hour(s)).   Scheduled Meds: . sodium chloride   Intravenous Once  . ALPRAZolam  0.25 mg Oral QHS  . donepezil  10 mg Oral Daily  . insulin aspart  0-9 Units Subcutaneous Q4H  . memantine  28 mg Oral q morning - 10a  . metoprolol tartrate  12.5 mg Oral BID  . polyethylene glycol  17 g Oral Daily  . senna  2 tablet Oral Daily   Continuous Infusions: . 0.45 % NaCl with KCl 20 mEq / L      Procedures/Studies: Dg Chest 1 View  Result Date: 03/26/2018 CLINICAL DATA:  Unwitnessed fall at home.  Preop left hip fracture. EXAM: CHEST  1 VIEW COMPARISON:  01/11/2016 FINDINGS: Heart is top-normal in size. Moderate aortic atherosclerosis with ectasia. Clear lungs. No acute osseous abnormality. IMPRESSION: Aortic atherosclerosis.  No active pulmonary disease. Electronically Signed   By: Ashley Royalty M.D.   On: 03/26/2018 22:16   Dg Hip Unilat With Pelvis 2-3 Views  Left  Result Date: 03/26/2018 CLINICAL DATA:  Unwitnessed fall at home today.  Pain. EXAM: DG HIP (WITH OR WITHOUT PELVIS) 2-3V LEFT COMPARISON:  None. FINDINGS: Acute, varus angulated intertrochanteric fracture of the left femur with avulsed lesser trochanter. Status post L4-5 lumbar fusion with hardware in place. No acute pelvic fracture. The native right hip appears intact. IMPRESSION: Acute, varus angulated intertrochanteric fracture of the left femur with avulsed lesser trochanter. Electronically Signed   By: Ashley Royalty M.D.   On: 03/26/2018 22:08    Orson Eva, DO  [facility] Pager [phone]  If 7PM-7AM, please contact night-coverage [email or web address] Password [email or web address] 03/27/2018, 8:30 AM   LOS: 1 day

## 2018-03-27 NOTE — Progress Notes (Signed)
Patient has not voided throughout night, pure wick catheter in place. Bladder scan done showing >321ml. Patient expressing need to void but states that she has to "get up" and go to the bathroom. Notified Dr. Manuella Ghazi to make aware. Awaiting orders.

## 2018-03-27 NOTE — Progress Notes (Signed)
Nutrition Brief Note  Patient identified on the Malnutrition Screening Tool (MST) Report  The patient presents with hx of CKD-3, dementia, DM, and HTN. She present with a hip fracture and is scheduled for surgery on 6/26. Based on chart review. The patient weight is stable with the exception of an outlier weight in January.   Wt Readings from Last 15 Encounters:  03/27/18 125 lb 14.1 oz (57.1 kg)  11/28/17 124 lb (56.2 kg)  10/27/17 118 lb (53.5 kg)  04/28/17 129 lb (58.5 kg)  12/05/16 127 lb (57.6 kg)  08/05/16 127 lb (57.6 kg)  02/19/16 132 lb (59.9 kg)  01/27/16 130 lb (59 kg)  01/13/16 128 lb (58.1 kg)  01/04/16 125 lb (56.7 kg)  10/10/15 135 lb (61.2 kg)  09/04/15 132 lb (59.9 kg)  04/08/15 131 lb (59.4 kg)  12/02/14 132 lb (59.9 kg)  07/30/14 132 lb (59.9 kg)    Body mass index is 23.79 kg/m. Patient meets criteria for normal based on current BMI.   Current diet order is Regular- no documented meal intake.  Labs and medications reviewed.   No nutrition interventions warranted at this time. She will be NPO after midnight for surgery tomorrow. If nutrition issues arise, please consult RD.   Colman Cater MS,RD,CSG,LDN Office: [phone] Pager: [phone]

## 2018-03-28 ENCOUNTER — Inpatient Hospital Stay (HOSPITAL_COMMUNITY): Payer: Medicare HMO | Admitting: Anesthesiology

## 2018-03-28 ENCOUNTER — Inpatient Hospital Stay (HOSPITAL_COMMUNITY): Payer: Medicare HMO

## 2018-03-28 ENCOUNTER — Encounter (HOSPITAL_COMMUNITY)
Admission: EM | Disposition: A | Discharge status: Skilled Nursing Facility | Payer: Self-pay | Source: Home / Self Care | Attending: Internal Medicine

## 2018-03-28 ENCOUNTER — Telehealth: Payer: Self-pay | Admitting: *Deleted

## 2018-03-28 ENCOUNTER — Encounter (HOSPITAL_COMMUNITY): Payer: Self-pay | Admitting: Anesthesiology

## 2018-03-28 HISTORY — PX: INTRAMEDULLARY (IM) NAIL INTERTROCHANTERIC: SHX5875

## 2018-03-28 LAB — TYPE AND SCREEN
ABO/RH(D): A POS
ANTIBODY SCREEN: NEGATIVE
UNIT DIVISION: 0
UNIT DIVISION: 0

## 2018-03-28 LAB — BPAM RBC
BLOOD PRODUCT EXPIRATION DATE: 201907062359
Blood Product Expiration Date: 201907182359
ISSUE DATE / TIME: 201906251151
ISSUE DATE / TIME: 201906251456
Unit Type and Rh: 600
Unit Type and Rh: 600

## 2018-03-28 LAB — GLUCOSE, CAPILLARY
GLUCOSE-CAPILLARY: 222 mg/dL — AB (ref 70–99)
GLUCOSE-CAPILLARY: 204 mg/dL — AB (ref 70–99)
Glucose-Capillary: 182 mg/dL — ABNORMAL HIGH (ref 70–99)
Glucose-Capillary: 177 mg/dL — ABNORMAL HIGH (ref 70–99)
Glucose-Capillary: 154 mg/dL — ABNORMAL HIGH (ref 70–99)
Glucose-Capillary: 247 mg/dL — ABNORMAL HIGH (ref 70–99)

## 2018-03-28 LAB — BASIC METABOLIC PANEL
Anion gap: 7 (ref 5–15)
BUN: 37 mg/dL — ABNORMAL HIGH (ref 8–23)
CALCIUM: 8 mg/dL — AB (ref 8.9–10.3)
CO2: 26 mmol/L (ref 22–32)
Chloride: 106 mmol/L (ref 98–111)
Creatinine, Ser: 1.44 mg/dL — ABNORMAL HIGH (ref 0.44–1.00)
GFR calc non Af Amer: 32 mL/min — ABNORMAL LOW (ref >60)
GFR, EST AFRICAN AMERICAN: 37 mL/min — AB (ref >60)
Glucose, Bld: 191 mg/dL — ABNORMAL HIGH (ref 70–99)
POTASSIUM: 4.1 mmol/L (ref 3.5–5.1)
Sodium: 139 mmol/L (ref 135–145)

## 2018-03-28 LAB — CBC
HEMATOCRIT: 34.3 % — AB (ref 36.0–46.0)
HEMOGLOBIN: 11.4 g/dL — AB (ref 12.0–15.0)
MCH: 28.8 pg (ref 26.0–34.0)
MCHC: 33.2 g/dL (ref 30.0–36.0)
MCV: 86.6 fL (ref 78.0–100.0)
Platelets: 116 10*3/uL — ABNORMAL LOW (ref 150–400)
RBC: 3.96 MIL/uL (ref 3.87–5.11)
RDW: 14.6 % (ref 11.5–15.5)
WBC: 13.4 10*3/uL — AB (ref 4.0–10.5)

## 2018-03-28 SURGERY — FIXATION, FRACTURE, INTERTROCHANTERIC, WITH INTRAMEDULLARY ROD
Anesthesia: General | Site: Hip | Laterality: Left

## 2018-03-28 MED ORDER — SODIUM CHLORIDE 0.9 % IR SOLN
Status: DC | PRN
  Administered 2018-03-28: 1

## 2018-03-28 MED ORDER — BUPIVACAINE-EPINEPHRINE (PF) 0.5% -1:200000 IJ SOLN
INTRAMUSCULAR | Status: DC | PRN
  Administered 2018-03-28: 60 mL

## 2018-03-28 MED ORDER — METOCLOPRAMIDE HCL 5 MG/ML IJ SOLN: 5.0000 mg | Freq: Three times a day (TID) | INTRAMUSCULAR | Status: DC | PRN

## 2018-03-28 MED ORDER — MORPHINE SULFATE (PF) 2 MG/ML IV SOLN
2.0000 mg | INTRAVENOUS | Status: DC | PRN
  Administered 2018-03-30: 2 mg via INTRAVENOUS
  Filled 2018-03-28: qty 1

## 2018-03-28 MED ORDER — HYDROCODONE-ACETAMINOPHEN 7.5-325 MG PO TABS: 1.0000 | ORAL_TABLET | Freq: Once | ORAL | Status: DC | PRN

## 2018-03-28 MED ORDER — DOCUSATE SODIUM 100 MG PO CAPS
100.0000 mg | ORAL_CAPSULE | Freq: Two times a day (BID) | ORAL | Status: DC
  Administered 2018-03-28 – 2018-03-31 (×8): 100 mg via ORAL
  Filled 2018-03-28 (×9): qty 1

## 2018-03-28 MED ORDER — SODIUM CHLORIDE 0.9 % IJ SOLN
INTRAMUSCULAR | Status: AC
  Filled 2018-03-28: qty 10

## 2018-03-28 MED ORDER — LIDOCAINE HCL (PF) 1 % IJ SOLN
INTRAMUSCULAR | Status: AC
  Filled 2018-03-28: qty 5

## 2018-03-28 MED ORDER — CEFAZOLIN SODIUM-DEXTROSE 2-4 GM/100ML-% IV SOLN
2.0000 g | Freq: Four times a day (QID) | INTRAVENOUS | Status: AC
  Administered 2018-03-28 (×2): 2 g via INTRAVENOUS
  Filled 2018-03-28 (×2): qty 100

## 2018-03-28 MED ORDER — ONDANSETRON HCL 4 MG/2ML IJ SOLN: 4.0000 mg | Freq: Once | INTRAMUSCULAR | Status: DC | PRN

## 2018-03-28 MED ORDER — BUPIVACAINE-EPINEPHRINE (PF) 0.5% -1:200000 IJ SOLN
INTRAMUSCULAR | Status: AC
  Filled 2018-03-28: qty 30

## 2018-03-28 MED ORDER — LACTATED RINGERS IV SOLN
INTRAVENOUS | Status: DC | PRN
  Administered 2018-03-28: 11:00:00 via INTRAVENOUS

## 2018-03-28 MED ORDER — CEFAZOLIN SODIUM-DEXTROSE 2-4 GM/100ML-% IV SOLN
2.0000 g | Freq: Four times a day (QID) | INTRAVENOUS | Status: DC
  Filled 2018-03-28 (×2): qty 100

## 2018-03-28 MED ORDER — ASPIRIN EC 325 MG PO TBEC
325.0000 mg | DELAYED_RELEASE_TABLET | Freq: Every day | ORAL | Status: DC
  Administered 2018-03-29 – 2018-04-02 (×5): 325 mg via ORAL
  Filled 2018-03-28 (×6): qty 1

## 2018-03-28 MED ORDER — PROPOFOL 10 MG/ML IV BOLUS
INTRAVENOUS | Status: DC | PRN
  Administered 2018-03-28: 80 mg via INTRAVENOUS

## 2018-03-28 MED ORDER — HYDRALAZINE HCL 20 MG/ML IJ SOLN
INTRAMUSCULAR | Status: AC
  Filled 2018-03-28: qty 1

## 2018-03-28 MED ORDER — MEPERIDINE HCL 50 MG/ML IJ SOLN: 6.2500 mg | INTRAMUSCULAR | Status: DC | PRN

## 2018-03-28 MED ORDER — HYDRALAZINE HCL 20 MG/ML IJ SOLN
INTRAMUSCULAR | Status: DC | PRN
  Administered 2018-03-28: 8 mg via INTRAVENOUS
  Administered 2018-03-28: 4 mg via INTRAVENOUS

## 2018-03-28 MED ORDER — CEFAZOLIN SODIUM-DEXTROSE 2-4 GM/100ML-% IV SOLN
2.0000 g | Freq: Four times a day (QID) | INTRAVENOUS | Status: DC
  Filled 2018-03-28: qty 100

## 2018-03-28 MED ORDER — INSULIN DETEMIR 100 UNIT/ML ~~LOC~~ SOLN
5.0000 [IU] | Freq: Every day | SUBCUTANEOUS | Status: DC
  Administered 2018-03-28 – 2018-04-01 (×5): 5 [IU] via SUBCUTANEOUS
  Filled 2018-03-28 (×6): qty 0.05

## 2018-03-28 MED ORDER — HYDROMORPHONE HCL 1 MG/ML IJ SOLN: 0.2500 mg | INTRAMUSCULAR | Status: DC | PRN

## 2018-03-28 MED ORDER — LACTATED RINGERS IV SOLN: INTRAVENOUS | Status: DC

## 2018-03-28 MED ORDER — LIDOCAINE HCL 1 % IJ SOLN
INTRAMUSCULAR | Status: DC | PRN
  Administered 2018-03-28: 25 mg via INTRADERMAL

## 2018-03-28 MED ORDER — METOCLOPRAMIDE HCL 10 MG PO TABS: 5.0000 mg | ORAL_TABLET | Freq: Three times a day (TID) | ORAL | Status: DC | PRN

## 2018-03-28 SURGICAL SUPPLY — 57 items
BIT DRILL AO GAMMA 4.2X300 (BIT) ×3 IMPLANT
BLADE HEX COATED 2.75 (ELECTRODE) ×2 IMPLANT
BLADE SURG SZ10 CARB STEEL (BLADE) ×4 IMPLANT
BNDG GAUZE ELAST 4 BULKY (GAUZE/BANDAGES/DRESSINGS) ×2 IMPLANT
CHLORAPREP W/TINT 26ML (MISCELLANEOUS) ×2 IMPLANT
CLOTH BEACON ORANGE TIMEOUT ST (SAFETY) ×2 IMPLANT
COVER LIGHT HANDLE STERIS (MISCELLANEOUS) ×4 IMPLANT
COVER MAYO STAND XLG (DRAPE) ×2 IMPLANT
DECANTER SPIKE VIAL GLASS SM (MISCELLANEOUS) ×4 IMPLANT
DRAPE STERI IOBAN 125X83 (DRAPES) ×2 IMPLANT
DRSG MEPILEX BORDER 4X12 (GAUZE/BANDAGES/DRESSINGS) ×2 IMPLANT
DRSG PAD ABDOMINAL 8X10 ST (GAUZE/BANDAGES/DRESSINGS) ×2 IMPLANT
ELECT REM PT RETURN 9FT ADLT (ELECTROSURGICAL) ×2
ELECTRODE REM PT RTRN 9FT ADLT (ELECTROSURGICAL) ×1 IMPLANT
GLOVE BIO SURGEON STRL SZ7 (GLOVE) ×2 IMPLANT
GLOVE BIOGEL PI IND STRL 7.0 (GLOVE) ×2 IMPLANT
GLOVE BIOGEL PI INDICATOR 7.0 (GLOVE) ×2
GLOVE SKINSENSE NS SZ8.0 LF (GLOVE) ×1
GLOVE SKINSENSE STRL SZ8.0 LF (GLOVE) ×1 IMPLANT
GLOVE SS N UNI LF 8.5 STRL (GLOVE) ×2 IMPLANT
GOWN STRL REUS W/ TWL LRG LVL3 (GOWN DISPOSABLE) ×1 IMPLANT
GOWN STRL REUS W/TWL LRG LVL3 (GOWN DISPOSABLE) ×4 IMPLANT
GOWN STRL REUS W/TWL XL LVL3 (GOWN DISPOSABLE) ×2 IMPLANT
GUIDEROD T2 3X1000 (ROD) ×3 IMPLANT
INST SET MAJOR BONE (KITS) ×2 IMPLANT
K-WIRE  3.2X450M STR (WIRE) ×2
K-WIRE 3.2X450M STR (WIRE) ×2
KIT BLADEGUARD II DBL (SET/KITS/TRAYS/PACK) ×2 IMPLANT
KIT TURNOVER CYSTO (KITS) ×2 IMPLANT
KWIRE 3.2X450M STR (WIRE) ×1 IMPLANT
MANIFOLD NEPTUNE II (INSTRUMENTS) ×2 IMPLANT
MARKER SKIN DUAL TIP RULER LAB (MISCELLANEOUS) ×2 IMPLANT
NAIL TROCH GAMMA 11X18 (Nail) ×1 IMPLANT
NDL HYPO 21X1.5 SAFETY (NEEDLE) ×1 IMPLANT
NDL SPNL 18GX3.5 QUINCKE PK (NEEDLE) ×1 IMPLANT
NEEDLE HYPO 21X1.5 SAFETY (NEEDLE) ×2 IMPLANT
NEEDLE SPNL 18GX3.5 QUINCKE PK (NEEDLE) ×2 IMPLANT
NS IRRIG 1000ML POUR BTL (IV SOLUTION) ×2 IMPLANT
PACK BASIC III (CUSTOM PROCEDURE TRAY) ×2
PACK SRG BSC III STRL LF ECLPS (CUSTOM PROCEDURE TRAY) ×1 IMPLANT
PENCIL HANDSWITCHING (ELECTRODE) ×2 IMPLANT
SCREW LAG GAMMA 3 TI 10.5X100M (Screw) ×1 IMPLANT
SCREW LOCKING T2 F/T  5MMX35MM (Screw) ×1 IMPLANT
SCREW LOCKING T2 F/T 5MMX35MM (Screw) IMPLANT
SET BASIN LINEN APH (SET/KITS/TRAYS/PACK) ×2 IMPLANT
SLING ARM FOAM STRAP LRG (SOFTGOODS) ×1 IMPLANT
SLING ARM FOAM STRAP MED (SOFTGOODS) ×2 IMPLANT
SLING ARM FOAM STRAP XLG (SOFTGOODS) ×1 IMPLANT
SPONGE LAP 18X18 X RAY DECT (DISPOSABLE) ×4 IMPLANT
STAPLER VISISTAT 35W (STAPLE) ×1 IMPLANT
SUT BRALON NAB BRD #1 30IN (SUTURE) ×2 IMPLANT
SUT MNCRL 0 VIOLET CTX 36 (SUTURE) ×1 IMPLANT
SUT MON AB 2-0 CT1 36 (SUTURE) ×2 IMPLANT
SUT MONOCRYL 0 CTX 36 (SUTURE) ×1
SYR 30ML LL (SYRINGE) ×3 IMPLANT
SYR BULB IRRIGATION 50ML (SYRINGE) ×4 IMPLANT
YANKAUER SUCT BULB TIP NO VENT (SUCTIONS) ×2 IMPLANT

## 2018-03-28 NOTE — Telephone Encounter (Signed)
Received call from patient son, Clayvon.   Reports that patient had fall on 03/27/2018 and has hip fx. She has undergone ORIF and is doing well. States that patient has appointment scheduled on 03/30/2018. Reports that patient will not be coming to appointment, but he and sister would like to take that time to discuss [name] as caregivers for patient and other advance directives.   MD please advise.

## 2018-03-28 NOTE — Telephone Encounter (Signed)
Okay to keep appointment and they can come in that spot

## 2018-03-28 NOTE — Brief Op Note (Signed)
03/28/2018  12:45 PM  PATIENT:  Betty [name]  84 y.o. female  PRE-OPERATIVE DIAGNOSIS:  left hip intertrochanteric fracture  POST-OPERATIVE DIAGNOSIS:  left hip intertrochanteric fracture  PROCEDURE:  Procedure(s): INTRAMEDULLARY (IM) NAIL INTERTROCHANTRIC (Left)  Implants 125 degree short gamma nail 100 mm lag screw 35 mm locking screw proximal acorn and sliding mode   assisted by Simonne Maffucci   Blood loss estimated 150 cc  SURGEON:  Surgeon(s) and Role:    Carole Civil, MD - Primary   LOCAL MEDICATIONS USED:  MARCAINE     SPECIMEN:  No Specimen  DISPOSITION OF SPECIMEN:  N/A  COUNTS:  YES  TOURNIQUET:  * No tourniquets in log *  DICTATION: .Dragon Dictation  PLAN OF CARE: Admit to inpatient   PATIENT DISPOSITION:  PACU - hemodynamically stable.   Delay start of Pharmacological VTE agent (>24hrs) due to surgical blood loss or risk of bleeding: no

## 2018-03-28 NOTE — Anesthesia Preprocedure Evaluation (Addendum)
Anesthesia Evaluation  Patient identified by MRN, date of birth, ID band Patient awake    Reviewed: Allergy & Precautions, H&P , NPO status , Patient's Chart, lab work & pertinent test results, reviewed documented beta blocker date and time   Airway Mallampati: II  TM Distance: >3 FB Neck ROM: full    Dental  (+) Edentulous Upper, Edentulous Lower   Pulmonary neg pulmonary ROS,    Pulmonary exam normal breath sounds clear to auscultation       Cardiovascular Exercise Tolerance: Good hypertension, negative cardio ROS   Rhythm:regular Rate:Normal     Neuro/Psych Anxiety Dementia negative neurological ROS  negative psych ROS   GI/Hepatic negative GI ROS, Neg liver ROS,   Endo/Other  negative endocrine ROSdiabetes  Renal/GU Renal diseasenegative Renal ROS  negative genitourinary   Musculoskeletal   Abdominal   Peds  Hematology negative hematology ROS (+)   Anesthesia Other Findings   Reproductive/Obstetrics negative OB ROS                            Anesthesia Physical Anesthesia Plan  ASA: III  Anesthesia Plan: General   Post-op Pain Management:    Induction:   PONV Risk Score and Plan:   Airway Management Planned:   Additional Equipment:   Intra-op Plan:   Post-operative Plan:   Informed Consent: I have reviewed the patients History and Physical, chart, labs and discussed the procedure including the risks, benefits and alternatives for the proposed anesthesia with the patient or authorized representative who has indicated his/her understanding and acceptance.   Dental Advisory Given  Plan Discussed with: CRNA  Anesthesia Plan Comments:         Anesthesia Quick Evaluation

## 2018-03-28 NOTE — Progress Notes (Signed)
PROGRESS NOTE    Betty [name]  OMV:[id number] DOB: December 17, 1930 DOA: 03/26/2018 PCP: Alycia Rossetti, MD     Brief Narrative:  84 year old woman admitted from home on 6/24 after being found down at home.  She has a history of dementia, stage III chronic kidney disease, hyperlipidemia, hypertension and diabetes mellitus.  The son went to run some errands and when he came back found the patient on the living room floor from a suspected mechanical fall.  The patient cannot provide any details regarding the fall.  She had significant left hip pain and was unable to get up.  Upon presentation to the ED x-rays revealed an acute varus angulated intertrochanteric left femur fracture, orthopedics was consulted and we are asked to admit for further evaluation and management.   Assessment & Plan:   Principal Problem:   Hip fracture ([place]) Active Problems:   Type II diabetes mellitus with renal manifestations (HCC)   Essential hypertension, benign   Hyperlipidemia   Anxiety   Dementia   CKD (chronic kidney disease), stage III (HCC)   Closed intertrochanteric fracture of femur (HCC)   Closed left intertrochanteric femur fracture -Seen by orthopedics who performed intraoperative repair on 6/26.  Had a left intramedullary intertrochanteric nail placement. -PT evaluation in a.m., but suspect will need SNF placement. -DVT prophylaxis as per orthopedics, it appears for now the plan is for full dose aspirin.    Essential hypertension -Fair control. -Continue beta-blocker. -Losartan and hydrochlorothiazide remain on hold due to acute on chronic kidney dysfunction.  Acute on chronic kidney disease stage III -Baseline creatinine appears between 1.2 and 1.5 -Creatinine was 1.75 on admission, down to 1.44 today. -Suspect acute component due to prerenal azotemia given dementia and dehydration.  Type 2 diabetes -CBGs remain uncontrolled. -We will add low-dose Levemir 5 units at bedtime,  continue to follow for further adjustment needs.  Dementia with behavioral disturbance -Continue Aricept and Namenda  Hyperlipidemia -Statin remains on hold due to elevated CPK following fall.  Anxiety -Continue home dose alprazolam   DVT prophylaxis: As per orthopedics, full dose aspirin Code Status: Full code Family Communication: Patient only Disposition Plan: To SNF once cleared by orthopedics  Consultants:   Orthopedics  Procedures:   Left hip fracture repair on 6/26  Antimicrobials:  Anti-infectives (From admission, onward)   Start     Dose/Rate Route Frequency Ordered Stop   03/28/18 1700  ceFAZolin (ANCEF) IVPB 2g/100 mL premix  Status:  Discontinued     2 g 200 mL/hr over 30 Minutes Intravenous Every 6 hours 03/28/18 1430 03/28/18 1640   03/28/18 1700  ceFAZolin (ANCEF) IVPB 2g/100 mL premix     2 g 200 mL/hr over 30 Minutes Intravenous Every 6 hours 03/28/18 1640 03/29/18 0459   03/28/18 1415  ceFAZolin (ANCEF) IVPB 2g/100 mL premix  Status:  Discontinued     2 g 200 mL/hr over 30 Minutes Intravenous Every 6 hours 03/28/18 1405 03/28/18 1430   03/28/18 0600  ceFAZolin (ANCEF) IVPB 2g/100 mL premix     2 g 200 mL/hr over 30 Minutes Intravenous On call to O.R. 03/27/18 1315 03/28/18 1111       Subjective: Seen briefly this morning prior to being taken to the OR, lying in bed, did not appear to be in marked distress.  Objective: Vitals:   03/28/18 1345 03/28/18 1401 03/28/18 1517 03/28/18 1604  BP: 132/66 (!) 138/46 (!) 144/72 (!) 137/50  Pulse: 85  85 91  Resp: 18 20  20 20  Temp: 97.8 F (36.6 C) 98 F (36.7 C) 98.6 F (37 C) 98.7 F (37.1 C)  TempSrc:   Oral Oral  SpO2: 100% 99% 98% 99%  Weight:      Height:        Intake/Output Summary (Last 24 hours) at 03/28/2018 1800 Last data filed at 03/28/2018 1411 Gross per 24 hour  Intake 2394.5 ml  Output 950 ml  Net 1444.5 ml   Filed Weights   03/26/18 1954 03/27/18 0128  Weight: 61.2 kg (135  lb) 57.1 kg (125 lb 14.1 oz)    Examination:  General exam: Awake, not very communicative Respiratory system: Clear to auscultation. Respiratory effort normal. Cardiovascular system:RRR. No murmurs, rubs, gallops. Gastrointestinal system: Abdomen is nondistended, soft and nontender. No organomegaly or masses felt. Normal bowel sounds heard. Central nervous system: Alert and oriented. No focal neurological deficits. Extremities: No C/C/E, +pedal pulses Skin: No rashes, lesions or ulcers Psychiatry: Judgemen unable to fully assess given current mental state    Data Reviewed: I have personally reviewed following labs and imaging studies  CBC: Recent Labs  Lab 03/26/18 2059 03/27/18 0556 03/28/18 0602  WBC 16.0* 12.4* 13.4*  NEUTROABS 14.7  --   --   HGB 8.5* 8.3* 11.4*  HCT 26.4* 25.7* 34.3*  MCV 84.6 84.3 86.6  PLT 155 162 240*   Basic Metabolic Panel: Recent Labs  Lab 03/26/18 2059 03/27/18 0556 03/28/18 0602  NA 141 144 139  K 3.9 3.5 4.1  CL 107 108 106  CO2 23 24 26   GLUCOSE 306* 140* 191*  BUN 42* 43* 37*  CREATININE 1.69* 1.75* 1.44*  CALCIUM 8.4* 8.5* 8.0*   GFR: Estimated Creatinine Clearance: 21.6 mL/min (A) (by C-G formula based on SCr of 1.44 mg/dL (H)). Liver Function Tests: No results for input(s): AST, ALT, ALKPHOS, BILITOT, PROT, ALBUMIN in the last 168 hours. No results for input(s): LIPASE, AMYLASE in the last 168 hours. No results for input(s): AMMONIA in the last 168 hours. Coagulation Profile: Recent Labs  Lab 03/26/18 2059  INR 1.13   Cardiac Enzymes: Recent Labs  Lab 03/26/18 2059  CKTOTAL 274*   BNP (last 3 results) No results for input(s): PROBNP in the last 8760 hours. HbA1C: No results for input(s): HGBA1C in the last 72 hours. CBG: Recent Labs  Lab 03/28/18 0814 03/28/18 1058 03/28/18 1251 03/28/18 1435 03/28/18 1606  GLUCAP 177* 154* 222* 247* 204*   Lipid Profile: No results for input(s): CHOL, HDL, LDLCALC,  TRIG, CHOLHDL, LDLDIRECT in the last 72 hours. Thyroid Function Tests: No results for input(s): TSH, T4TOTAL, FREET4, T3FREE, THYROIDAB in the last 72 hours. Anemia Panel: No results for input(s): VITAMINB12, FOLATE, FERRITIN, TIBC, IRON, RETICCTPCT in the last 72 hours. Urine analysis:    Component Value Date/Time   COLORURINE YELLOW 01/11/2016 [place] 01/11/2016 0931   LABSPEC 1.020 01/11/2016 0931   PHURINE 6.0 01/11/2016 0931   GLUCOSEU NEGATIVE 01/11/2016 0931   HGBUR TRACE (A) 01/11/2016 0931   BILIRUBINUR NEGATIVE 01/11/2016 0931   KETONESUR TRACE (A) 01/11/2016 0931   PROTEINUR TRACE (A) 01/11/2016 0931   UROBILINOGEN 0.2 07/04/2014 0906   NITRITE NEGATIVE 01/11/2016 0931   LEUKOCYTESUR MODERATE (A) 01/11/2016 0931   Sepsis Labs: @LABRCNTIP (procalcitonin:4,lacticidven:4)  ) Recent Results (from the past 240 hour(s))  Surgical pcr screen     Status: None   Collection Time: 03/27/18 10:59 AM  Result Value Ref Range Status   MRSA, PCR NEGATIVE NEGATIVE  Final   Staphylococcus aureus NEGATIVE NEGATIVE Final    Comment: (NOTE) The Xpert SA Assay (FDA approved for NASAL specimens in patients 68 years of age and older), is one component of a comprehensive surveillance program. It is not intended to diagnose infection nor to guide or monitor treatment. Performed at [facility], [address] [place], [place] [address]          Radiology Studies: Dg Chest 1 View  Result Date: 03/26/2018 CLINICAL DATA:  Unwitnessed fall at home.  Preop left hip fracture. EXAM: CHEST  1 VIEW COMPARISON:  01/11/2016 FINDINGS: Heart is top-normal in size. Moderate aortic atherosclerosis with ectasia. Clear lungs. No acute osseous abnormality. IMPRESSION: Aortic atherosclerosis.  No active pulmonary disease. Electronically Signed   By: Ashley Royalty M.D.   On: 03/26/2018 22:16   Dg Hip Operative Unilat W Or W/o Pelvis Left  Result Date: 03/28/2018 CLINICAL DATA:  Left hip  fracture. EXAM: OPERATIVE left HIP (WITH PELVIS IF PERFORMED) 8 VIEWS TECHNIQUE: Fluoroscopic spot image(s) were submitted for interpretation post-operatively. Radiation exposure index: 21.9 mGy. COMPARISON:  Radiographs of March 26, 2018. FINDINGS: Eight fluoroscopic images of the left hip demonstrate the patient be status post surgical internal fixation of intertrochanteric fracture of proximal left femur. IMPRESSION: Status post surgical internal fixation of proximal left femoral intertrochanteric fracture. Electronically Signed   By: Marijo Conception, M.D.   On: 03/28/2018 14:45   Dg Hip Unilat With Pelvis 2-3 Views Left  Result Date: 03/26/2018 CLINICAL DATA:  Unwitnessed fall at home today.  Pain. EXAM: DG HIP (WITH OR WITHOUT PELVIS) 2-3V LEFT COMPARISON:  None. FINDINGS: Acute, varus angulated intertrochanteric fracture of the left femur with avulsed lesser trochanter. Status post L4-5 lumbar fusion with hardware in place. No acute pelvic fracture. The native right hip appears intact. IMPRESSION: Acute, varus angulated intertrochanteric fracture of the left femur with avulsed lesser trochanter. Electronically Signed   By: Ashley Royalty M.D.   On: 03/26/2018 22:08        Scheduled Meds: . ALPRAZolam  0.25 mg Oral QHS  . [START ON 03/29/2018] aspirin EC  325 mg Oral Q breakfast  . docusate sodium  100 mg Oral BID  . donepezil  10 mg Oral Daily  . insulin aspart  0-9 Units Subcutaneous Q4H  . insulin detemir  5 Units Subcutaneous QHS  . memantine  28 mg Oral q morning - 10a  . metoprolol tartrate  12.5 mg Oral BID  . polyethylene glycol  17 g Oral Daily  . senna  2 tablet Oral Daily   Continuous Infusions: . 0.45 % NaCl with KCl 20 mEq / L 75 mL/hr at 03/28/18 1411  .  ceFAZolin (ANCEF) IV 2 g (03/28/18 1652)     LOS: 2 days    Time spent: 35 minutes. Greater than 50% of this time was spent in direct contact with the patient, coordinating care and discussing relevant ongoing clinical  issues, including plans for operative repair of hip today and subsequent need for rehabilitation at SNF.     Lelon Frohlich, MD [facility] Pager [phone]  If 7PM-7AM, please contact night-coverage [email or web address] Password [facility] 03/28/2018, 6:00 PM

## 2018-03-28 NOTE — Progress Notes (Signed)
Patient ID: Betty [name], female   DOB: 11/09/1930, 84 y.o.   MRN: [id number]  Hospital day 2 left hip fracture  Hemoglobin is now 11.4 after 2 units of blood  CBC Latest Ref Rng & Units 03/28/2018 03/27/2018 03/26/2018  WBC 4.0 - 10.5 K/uL 13.4(H) 12.4(H) 16.0(H)  Hemoglobin 12.0 - 15.0 g/dL 11.4(L) 8.3(L) 8.5(L)  Hematocrit 36.0 - 46.0 % 34.3(L) 25.7(L) 26.4(L)  Platelets 150 - 400 K/uL 116(L) 162 155    BMP Latest Ref Rng & Units 03/28/2018 03/27/2018 03/26/2018  Glucose 70 - 99 mg/dL 191(H) 140(H) 306(H)  BUN 8 - 23 mg/dL 37(H) 43(H) 42(H)  Creatinine 0.44 - 1.00 mg/dL 1.44(H) 1.75(H) 1.69(H)  BUN/Creat Ratio 6 - 22 (calc) - - -  Sodium 135 - 145 mmol/L 139 144 141  Potassium 3.5 - 5.1 mmol/L 4.1 3.5 3.9  Chloride 98 - 111 mmol/L 106 108 107  CO2 22 - 32 mmol/L 26 24 23   Calcium 8.9 - 10.3 mg/dL 8.0(L) 8.5(L) 8.4(L)    Anticipate surgery today at 2

## 2018-03-28 NOTE — Op Note (Signed)
03/28/2018  12:45 PM  PATIENT:  Betty [name]  84 y.o. female  PRE-OPERATIVE DIAGNOSIS:  left hip intertrochanteric fracture  POST-OPERATIVE DIAGNOSIS:  left hip intertrochanteric fracture  PROCEDURE:  Procedure(s): INTRAMEDULLARY (IM) NAIL INTERTROCHANTRIC ([id number]  The patient was identified in preop surgical site was confirmed marked chart was reviewed patient was taken to surgery.  2 g Ancef started IV.  Patient had general anesthesia.  Patient placed on fracture table.  Padded perineal post was used.  Patient was placed in traction with the right leg in a well leg holder left leg in traction  C-arm was brought in.  The operative leg was manipulated with traction and slight internal rotation.  We got an anatomic reduction.  Timeout was completed after sterile prep and drape  Proximal incision was made at the tip of the trochanter subcutaneous tissue was divided.  Fascia was split in line with the skin incision.  A curved awl was used to enter the femoral canal.  A guidewire was placed through the all the curved awl was removed.  X-ray confirmed position of the guidewire and the nail was slid over the guidewire  A second lateral incision was made and the cannula was advanced down to bone.  Perforating drill was used to perforate the lateral cortex the guidewire was placed into the center of the femoral head and confirmed on AP and lateral x-ray with a acceptable tip to apex distance  We measured lag screw.  93 mm and selected a 100 mm reamer was passed over the guidewire and then the lag screw was placed.  The proximal femur was irrigated and the acorn was placed.  The acorn was placed in sliding mode.  Engagement was confirmed by toggling the screwdriver handle for the lag screw.  X-rays showed good position of the implant  The traction was released.  The distal locking screw was placed through a small stab incision.  After drilling the hole a 35 mm locking screw was  placed  Final x-rays confirmed reduction and position of the implant  All wounds were thoroughly irrigated.  The proximal wound was closed with #1 Braylon for the fascia 0 Monocryl for the subcu tissue.  We injected Marcaine with epinephrine total of 60 cc to the 3 wounds.  The other 2 wounds were closed with 0 Monocryl suture  Staples were used to reapproximate the skin edges and a sterile bandage was applied   The postoperative plan Immediate weightbearing as tolerated Staples out in 14 days DVT prophylaxis for 28 days   Implants 125 degree short gamma nail 100 mm lag screw 35 mm locking screw proximal acorn and sliding mode   assisted by Simonne Maffucci   Blood loss estimated 150 cc  SURGEON:  Surgeon(s) and Role:    * Carole Civil, MD - Primary   LOCAL MEDICATIONS USED:  MARCAINE     SPECIMEN:  No Specimen  DISPOSITION OF SPECIMEN:  N/A  COUNTS:  YES  TOURNIQUET:  * No tourniquets in log *  DICTATION: .Dragon Dictation  PLAN OF CARE: Admit to inpatient   PATIENT DISPOSITION:  PACU - hemodynamically stable.   Delay start of Pharmacological VTE agent (>24hrs) due to surgical blood loss or risk of bleeding: no

## 2018-03-28 NOTE — Anesthesia Postprocedure Evaluation (Signed)
Anesthesia Post Note  Patient: Betty [name]  Procedure(s) Performed: INTRAMEDULLARY (IM) NAIL INTERTROCHANTRIC (Left Hip)  Patient location during evaluation: PACU Anesthesia Type: General Level of consciousness: awake Pain management: pain level controlled Vital Signs Assessment: post-procedure vital signs reviewed and stable Respiratory status: spontaneous breathing, nonlabored ventilation and respiratory function stable Cardiovascular status: blood pressure returned to baseline Postop Assessment: no apparent nausea or vomiting Anesthetic complications: no     Last Vitals:  Vitals:   03/28/18 1300 03/28/18 1315  BP: (!) 146/45 (!) 146/48  Pulse: 80 83  Resp: 19 17  Temp:    SpO2: 100% 100%    Last Pain:  Vitals:   03/28/18 1315  TempSrc:   PainSc: Asleep                 Tiran Sauseda J

## 2018-03-28 NOTE — Anesthesia Procedure Notes (Signed)
Procedure Name: LMA Insertion Date/Time: 03/28/2018 11:26 AM Performed by: Charmaine Downs, CRNA Pre-anesthesia Checklist: Patient identified, Emergency Drugs available, Suction available and Patient being monitored Patient Re-evaluated:Patient Re-evaluated prior to induction Oxygen Delivery Method: Circle system utilized Preoxygenation: Pre-oxygenation with 100% oxygen Induction Type: IV induction Ventilation: Mask ventilation without difficulty LMA: LMA inserted LMA Size: 4.0 Tube type: Oral Number of attempts: 1 Placement Confirmation: positive ETCO2,  CO2 detector and breath sounds checked- equal and bilateral Tube secured with: Tape Dental Injury: Teeth and Oropharynx as per pre-operative assessment

## 2018-03-28 NOTE — H&P (View-Only) (Signed)
Patient ID: Betty [name], female   DOB: [birth year], 84 y.o.   MRN: [id number]  Hospital day 2 left hip fracture  Hemoglobin is now 11.4 after 2 units of blood  CBC Latest Ref Rng & Units 03/28/2018 03/27/2018 03/26/2018  WBC 4.0 - 10.5 K/uL 13.4(H) 12.4(H) 16.0(H)  Hemoglobin 12.0 - 15.0 g/dL 11.4(L) 8.3(L) 8.5(L)  Hematocrit 36.0 - 46.0 % 34.3(L) 25.7(L) 26.4(L)  Platelets 150 - 400 K/uL 116(L) 162 155    BMP Latest Ref Rng & Units 03/28/2018 03/27/2018 03/26/2018  Glucose 70 - 99 mg/dL 191(H) 140(H) 306(H)  BUN 8 - 23 mg/dL 37(H) 43(H) 42(H)  Creatinine 0.44 - 1.00 mg/dL 1.44(H) 1.75(H) 1.69(H)  BUN/Creat Ratio 6 - 22 (calc) - - -  Sodium 135 - 145 mmol/L 139 144 141  Potassium 3.5 - 5.1 mmol/L 4.1 3.5 3.9  Chloride 98 - 111 mmol/L 106 108 107  CO2 22 - 32 mmol/L 26 24 23   Calcium 8.9 - 10.3 mg/dL 8.0(L) 8.5(L) 8.4(L)    Anticipate surgery today at 21

## 2018-03-28 NOTE — Interval H&P Note (Signed)
History and Physical Interval Note:  03/28/2018 11:11 AM  Betty [name]  has presented today for surgery, with the diagnosis of left hip intertrochanteric fracture  The various methods of treatment have been discussed with the patient and family. After consideration of risks, benefits and other options for treatment, the patient has consented to  Procedure(s): INTRAMEDULLARY (IM) NAIL INTERTROCHANTRIC (Left) as a surgical intervention .  The patient's history has been reviewed, patient examined, no change in status, stable for surgery.  I have reviewed the patient's chart and labs.  Questions were answered to the patient's satisfaction.     Arther Abbott

## 2018-03-28 NOTE — Brief Op Note (Signed)
03/28/2018  12:43 PM  PATIENT:  Migdalia Dk  84 y.o. female  PRE-OPERATIVE DIAGNOSIS:  left hip intertrochanteric fracture  POST-OPERATIVE DIAGNOSIS:  left hip intertrochanteric fracture  PROCEDURE:  Procedure(s): INTRAMEDULLARY (IM) NAIL INTERTROCHANTRIC (Left)  Implants 125 degree short gamma nail 100 mm lag screw 35 mm locking screw proximal acorn and sliding mode   assisted by Simonne Maffucci   Blood loss estimated 150 cc  SURGEON:  Surgeon(s) and Role:    Carole Civil, MD - Primary   LOCAL MEDICATIONS USED:  MARCAINE     SPECIMEN:  No Specimen  DISPOSITION OF SPECIMEN:  N/A  COUNTS:  YES  TOURNIQUET:  * No tourniquets in log *  DICTATION: .Dragon Dictation  PLAN OF CARE: Admit to inpatient   PATIENT DISPOSITION:  PACU - hemodynamically stable.   Delay start of Pharmacological VTE agent (>24hrs) due to surgical blood loss or risk of bleeding: no

## 2018-03-28 NOTE — Transfer of Care (Signed)
Immediate Anesthesia Transfer of Care Note  Patient: Betty [name]  Procedure(s) Performed: INTRAMEDULLARY (IM) NAIL INTERTROCHANTRIC (Left Hip)  Patient Location: PACU  Anesthesia Type:General  Level of Consciousness: drowsy and patient cooperative  Airway & Oxygen Therapy: Patient Spontanous Breathing and Patient connected to face mask oxygen  Post-op Assessment: Report given to RN and Post -op Vital signs reviewed and stable  Post vital signs: Reviewed and stable  Last Vitals:  Vitals Value Taken Time  BP    Temp    Pulse 86 03/28/2018 12:48 PM  Resp 15 03/28/2018 12:48 PM  SpO2 95 % 03/28/2018 12:48 PM  Vitals shown include unvalidated device data.  Last Pain:  Vitals:   03/28/18 1030  TempSrc: Oral  PainSc: 0-No pain         Complications: No apparent anesthesia complications

## 2018-03-29 ENCOUNTER — Encounter (HOSPITAL_COMMUNITY): Payer: Self-pay | Admitting: Orthopedic Surgery

## 2018-03-29 DIAGNOSIS — N183 Chronic kidney disease, stage 3 (moderate): Secondary | ICD-10-CM

## 2018-03-29 DIAGNOSIS — I10 Essential (primary) hypertension: Secondary | ICD-10-CM

## 2018-03-29 DIAGNOSIS — E782 Mixed hyperlipidemia: Secondary | ICD-10-CM

## 2018-03-29 LAB — BASIC METABOLIC PANEL
ANION GAP: 8 (ref 5–15)
BUN: 28 mg/dL — ABNORMAL HIGH (ref 8–23)
CO2: 27 mmol/L (ref 22–32)
Calcium: 7.8 mg/dL — ABNORMAL LOW (ref 8.9–10.3)
Chloride: 104 mmol/L (ref 98–111)
Creatinine, Ser: 1.28 mg/dL — ABNORMAL HIGH (ref 0.44–1.00)
GFR calc Af Amer: 43 mL/min — ABNORMAL LOW (ref >60)
GFR, EST NON AFRICAN AMERICAN: 37 mL/min — AB (ref >60)
Glucose, Bld: 105 mg/dL — ABNORMAL HIGH (ref 70–99)
POTASSIUM: 4.1 mmol/L (ref 3.5–5.1)
SODIUM: 139 mmol/L (ref 135–145)

## 2018-03-29 LAB — CBC
HCT: 30.9 % — ABNORMAL LOW (ref 36.0–46.0)
Hemoglobin: 10.4 g/dL — ABNORMAL LOW (ref 12.0–15.0)
MCH: 29.5 pg (ref 26.0–34.0)
MCHC: 33.7 g/dL (ref 30.0–36.0)
MCV: 87.5 fL (ref 78.0–100.0)
PLATELETS: 109 10*3/uL — AB (ref 150–400)
RBC: 3.53 MIL/uL — AB (ref 3.87–5.11)
RDW: 15.1 % (ref 11.5–15.5)
WBC: 13.2 10*3/uL — AB (ref 4.0–10.5)

## 2018-03-29 LAB — GLUCOSE, CAPILLARY
GLUCOSE-CAPILLARY: 177 mg/dL — AB (ref 70–99)
GLUCOSE-CAPILLARY: 183 mg/dL — AB (ref 70–99)
Glucose-Capillary: 230 mg/dL — ABNORMAL HIGH (ref 70–99)
Glucose-Capillary: 98 mg/dL (ref 70–99)
Glucose-Capillary: 103 mg/dL — ABNORMAL HIGH (ref 70–99)
Glucose-Capillary: 195 mg/dL — ABNORMAL HIGH (ref 70–99)
Glucose-Capillary: 197 mg/dL — ABNORMAL HIGH (ref 70–99)

## 2018-03-29 NOTE — Progress Notes (Signed)
PROGRESS NOTE    Betty [name]  IRJ:[id number] DOB: 1931-08-18 DOA: 03/26/2018 PCP: Alycia Rossetti, MD     Brief Narrative:  84 year old woman admitted from home on 6/24 after being found down at home.  She has a history of dementia, stage III chronic kidney disease, hyperlipidemia, hypertension and diabetes mellitus.  The son went to run some errands and when he came back found the patient on the living room floor from a suspected mechanical fall.  The patient cannot provide any details regarding the fall.  She had significant left hip pain and was unable to get up.  Upon presentation to the ED x-rays revealed an acute varus angulated intertrochanteric left femur fracture, orthopedics was consulted and we are asked to admit for further evaluation and management.   Assessment & Plan:   Principal Problem:   Hip fracture ([place]) Active Problems:   Type II diabetes mellitus with renal manifestations (HCC)   Essential hypertension, benign   Hyperlipidemia   Anxiety   Dementia   CKD (chronic kidney disease), stage III (HCC)   Closed intertrochanteric fracture of femur (HCC)   Closed left intertrochanteric femur fracture -Seen by orthopedics who performed intraoperative repair on 6/26.  Had a left intramedullary intertrochanteric nail placement. -PT evaluation: SNF placement for ST-rehab s/p hip fracture. -DVT prophylaxis as per orthopedics, it appears for now the plan is for full dose aspirin.    Essential hypertension -Fair control. -Continue beta-blocker. -Losartan and hydrochlorothiazide remain on hold due to acute on chronic kidney dysfunction.  Acute on chronic kidney disease stage III -Baseline creatinine appears between 1.2 and 1.5 -Creatinine was 1.75 on admission, back to baseline of 1.28 on 6/27. -Suspect acute component due to prerenal azotemia given dementia and dehydration.  Type 2 diabetes -CBGs with improved control after addition of low-dose  Levemir. -Continue to follow for further adjustment needs.  Dementia with behavioral disturbance -Continue Aricept and Namenda  Hyperlipidemia -Statin remains on hold due to elevated CPK following fall. -Recheck CPK levels in am.  Anxiety -Continue home dose alprazolam   DVT prophylaxis: As per orthopedics, full dose aspirin Code Status: Full code Family Communication: Patient only Disposition Plan: To SNF once cleared by orthopedics; hopefully in 24 hours  Consultants:   Orthopedics  Procedures:   Left hip fracture repair on 6/26  Antimicrobials:  Anti-infectives (From admission, onward)   Start     Dose/Rate Route Frequency Ordered Stop   03/28/18 1700  ceFAZolin (ANCEF) IVPB 2g/100 mL premix  Status:  Discontinued     2 g 200 mL/hr over 30 Minutes Intravenous Every 6 hours 03/28/18 1430 03/28/18 1640   03/28/18 1700  ceFAZolin (ANCEF) IVPB 2g/100 mL premix     2 g 200 mL/hr over 30 Minutes Intravenous Every 6 hours 03/28/18 1640 03/28/18 2224   03/28/18 1415  ceFAZolin (ANCEF) IVPB 2g/100 mL premix  Status:  Discontinued     2 g 200 mL/hr over 30 Minutes Intravenous Every 6 hours 03/28/18 1405 03/28/18 1430   03/28/18 0600  ceFAZolin (ANCEF) IVPB 2g/100 mL premix     2 g 200 mL/hr over 30 Minutes Intravenous On call to O.R. 03/27/18 1315 03/28/18 1111       Subjective: Seen during lunchtime, sitting in bed eating her trach, denies pain.  Objective: Vitals:   03/28/18 1826 03/28/18 2241 03/29/18 0445 03/29/18 0845  BP: (!) 154/55 (!) 136/51 (!) 154/57 (!) 123/44  Pulse: 81 88 76 80  Resp: 20 16 18  19  Temp: 99.9 F (37.7 C) 98 F (36.7 C) 98.8 F (37.1 C) 98.9 F (37.2 C)  TempSrc: Oral Oral Oral Oral  SpO2: 100% 99% 100% 98%  Weight:      Height:        Intake/Output Summary (Last 24 hours) at 03/29/2018 0921 Last data filed at 03/29/2018 0851 Gross per 24 hour  Intake 2206.25 ml  Output 1500 ml  Net 706.25 ml   Filed Weights   03/26/18 1954  03/27/18 0128  Weight: 61.2 kg (135 lb) 57.1 kg (125 lb 14.1 oz)    Examination:  General exam: Alert, awake, pleasant and cooperative with exam Respiratory system: Clear to auscultation. Respiratory effort normal. Cardiovascular system:RRR. No murmurs, rubs, gallops. Gastrointestinal system: Abdomen is nondistended, soft and nontender. No organomegaly or masses felt. Normal bowel sounds heard. Central nervous system: Alert and oriented. No focal neurological deficits. Extremities: No C/C/E, +pedal pulses Skin: No rashes, lesions or ulcers Psychiatry: Judgement and insight appear normal. Mood & affect appropriate.      Data Reviewed: I have personally reviewed following labs and imaging studies  CBC: Recent Labs  Lab 03/26/18 2059 03/27/18 0556 03/28/18 0602 03/29/18 0453  WBC 16.0* 12.4* 13.4* 13.2*  NEUTROABS 14.7  --   --   --   HGB 8.5* 8.3* 11.4* 10.4*  HCT 26.4* 25.7* 34.3* 30.9*  MCV 84.6 84.3 86.6 87.5  PLT 155 162 116* 580*   Basic Metabolic Panel: Recent Labs  Lab 03/26/18 2059 03/27/18 0556 03/28/18 0602 03/29/18 0453  NA 141 144 139 139  K 3.9 3.5 4.1 4.1  CL 107 108 106 104  CO2 23 24 26 27   GLUCOSE 306* 140* 191* 105*  BUN 42* 43* 37* 28*  CREATININE 1.69* 1.75* 1.44* 1.28*  CALCIUM 8.4* 8.5* 8.0* 7.8*   GFR: Estimated Creatinine Clearance: 24.2 mL/min (A) (by C-G formula based on SCr of 1.28 mg/dL (H)). Liver Function Tests: No results for input(s): AST, ALT, ALKPHOS, BILITOT, PROT, ALBUMIN in the last 168 hours. No results for input(s): LIPASE, AMYLASE in the last 168 hours. No results for input(s): AMMONIA in the last 168 hours. Coagulation Profile: Recent Labs  Lab 03/26/18 2059  INR 1.13   Cardiac Enzymes: Recent Labs  Lab 03/26/18 2059  CKTOTAL 274*   BNP (last 3 results) No results for input(s): PROBNP in the last 8760 hours. HbA1C: No results for input(s): HGBA1C in the last 72 hours. CBG: Recent Labs  Lab 03/28/18 1606  03/28/18 2012 03/29/18 0111 03/29/18 0459 03/29/18 0757  GLUCAP 204* 230* 177* 98 103*   Lipid Profile: No results for input(s): CHOL, HDL, LDLCALC, TRIG, CHOLHDL, LDLDIRECT in the last 72 hours. Thyroid Function Tests: No results for input(s): TSH, T4TOTAL, FREET4, T3FREE, THYROIDAB in the last 72 hours. Anemia Panel: No results for input(s): VITAMINB12, FOLATE, FERRITIN, TIBC, IRON, RETICCTPCT in the last 72 hours. Urine analysis:    Component Value Date/Time   COLORURINE YELLOW 01/11/2016 [place] 01/11/2016 0931   LABSPEC 1.020 01/11/2016 0931   PHURINE 6.0 01/11/2016 0931   GLUCOSEU NEGATIVE 01/11/2016 0931   HGBUR TRACE (A) 01/11/2016 0931   BILIRUBINUR NEGATIVE 01/11/2016 0931   KETONESUR TRACE (A) 01/11/2016 0931   PROTEINUR TRACE (A) 01/11/2016 0931   UROBILINOGEN 0.2 07/04/2014 0906   NITRITE NEGATIVE 01/11/2016 0931   LEUKOCYTESUR MODERATE (A) 01/11/2016 0931   Sepsis Labs: @LABRCNTIP (procalcitonin:4,lacticidven:4)  ) Recent Results (from the past 240 hour(s))  Surgical pcr screen  Status: None   Collection Time: 03/27/18 10:59 AM  Result Value Ref Range Status   MRSA, PCR NEGATIVE NEGATIVE Final   Staphylococcus aureus NEGATIVE NEGATIVE Final    Comment: (NOTE) The Xpert SA Assay (FDA approved for NASAL specimens in patients 61 years of age and older), is one component of a comprehensive surveillance program. It is not intended to diagnose infection nor to guide or monitor treatment. Performed at [facility], [address], [place], [place] [address]          Radiology Studies: Dg Hip Operative Unilat W Or W/o Pelvis Left  Result Date: 03/28/2018 CLINICAL DATA:  Left hip fracture. EXAM: OPERATIVE left HIP (WITH PELVIS IF PERFORMED) 8 VIEWS TECHNIQUE: Fluoroscopic spot image(s) were submitted for interpretation post-operatively. Radiation exposure index: 21.9 mGy. COMPARISON:  Radiographs of March 26, 2018. FINDINGS: Eight  fluoroscopic images of the left hip demonstrate the patient be status post surgical internal fixation of intertrochanteric fracture of proximal left femur. IMPRESSION: Status post surgical internal fixation of proximal left femoral intertrochanteric fracture. Electronically Signed   By: Marijo Conception, M.D.   On: 03/28/2018 14:45        Scheduled Meds: . ALPRAZolam  0.25 mg Oral QHS  . aspirin EC  325 mg Oral Q breakfast  . docusate sodium  100 mg Oral BID  . donepezil  10 mg Oral Daily  . insulin aspart  0-9 Units Subcutaneous Q4H  . insulin detemir  5 Units Subcutaneous QHS  . memantine  28 mg Oral q morning - 10a  . metoprolol tartrate  12.5 mg Oral BID  . polyethylene glycol  17 g Oral Daily  . senna  2 tablet Oral Daily   Continuous Infusions: . 0.45 % NaCl with KCl 20 mEq / L 75 mL/hr at 03/28/18 2200     LOS: 3 days    Time spent: 25 minutes.     Lelon Frohlich, MD [facility] Pager [phone]  If 7PM-7AM, please contact night-coverage [email or web address] Password [facility] 03/29/2018, 9:21 AM

## 2018-03-29 NOTE — NC FL2 (Signed)
[name] LEVEL OF CARE SCREENING TOOL     IDENTIFICATION  Patient Name: Betty [name] Birthdate: 01-06-1931 Sex: female Admission Date (Current Location): 03/26/2018  [facility] and [place] Number:  [employer] and Address:  [name] [address] [place]      Provider Number: [id number]  Attending Physician Name and Address:  Isaac Bliss, Olam Idler*  Relative Name and Phone Number:       Current Level of Care: Hospital Recommended Level of Care: [place] Prior Approval Number:    Date Approved/Denied:   PASRR Number: [id number]  Discharge Plan: SNF    Current Diagnoses: Patient Active Problem List   Diagnosis Date Noted  . Closed intertrochanteric fracture of femur ([name]) 03/27/2018  . Hip fracture ([name]) 03/26/2018  . Mild protein malnutrition ([name]) 11/28/2017  . Syncope 01/11/2016  . UTI (urinary tract infection) 01/11/2016  . Diarrhea 12/02/2014  . Chronic insomnia 04/01/2013  . CKD (chronic kidney disease), stage III ([name]) 01/02/2013  . Dementia 09/23/2012  . Weight loss 09/23/2012  . Anxiety 06/21/2012  . Type II diabetes mellitus with renal manifestations ([name]) 05/17/2012  . Essential hypertension, benign 05/17/2012  . Hyperlipidemia 05/17/2012    Orientation RESPIRATION BLADDER Height & Weight     Self, Place  Normal Continent Weight: 125 lb 14.1 oz (57.1 kg) Height:  5\' 1"  (154.9 cm)  BEHAVIORAL SYMPTOMS/MOOD NEUROLOGICAL BOWEL NUTRITION STATUS      Continent Diet(regular)  AMBULATORY STATUS COMMUNICATION OF NEEDS Skin   Extensive Assist Verbally Surgical wounds(left hip)                       Personal Care Assistance Level of Assistance  Bathing, Feeding, Dressing Bathing Assistance: Limited assistance Feeding assistance: Independent Dressing Assistance: Limited assistance     Functional Limitations Info  Sight, Hearing, Speech Sight Info: Adequate Hearing  Info: Adequate Speech Info: Adequate    SPECIAL CARE FACTORS FREQUENCY  PT (By licensed PT)     PT Frequency: 5x/week              Contractures Contractures Info: Not present    Additional Factors Info  Code Status, Allergies, Psychotropic Code Status Info: Full Code Allergies Info: NKA Psychotropic Info: Xanax         Current Medications (03/29/2018):  This is the current hospital active medication list Current Facility-Administered Medications  Medication Dose Route Frequency Provider Last Rate Last Dose  . 0.45 % NaCl with KCl 20 mEq / L infusion   Intravenous Continuous Carole Civil, MD 75 mL/hr at 03/29/18 1158    . acetaminophen (TYLENOL) tablet 650 mg  650 mg Oral Q6H PRN Carole Civil, MD       Or  . acetaminophen (TYLENOL) suppository 650 mg  650 mg Rectal Q6H PRN Carole Civil, MD      . ALPRAZolam Duanne Moron) tablet 0.25 mg  0.25 mg Oral QHS Carole Civil, MD   0.25 mg at 03/28/18 2149  . aspirin EC tablet 325 mg  325 mg Oral Q breakfast Carole Civil, MD   325 mg at 03/29/18 0926  . docusate sodium (COLACE) capsule 100 mg  100 mg Oral BID Carole Civil, MD   100 mg at 03/29/18 0926  . donepezil (ARICEPT) tablet 10 mg  10 mg Oral Daily Carole Civil, MD   10 mg at 03/29/18 0925  . insulin aspart (novoLOG) injection 0-9  Units  0-9 Units Subcutaneous Q4H Carole Civil, MD   2 Units at 03/29/18 1159  . insulin detemir (LEVEMIR) injection 5 Units  5 Units Subcutaneous QHS Isaac Bliss, Rayford Halsted, MD   5 Units at 03/28/18 2150  . LORazepam (ATIVAN) injection 0.5 mg  0.5 mg Intravenous Q4H PRN Carole Civil, MD      . memantine (NAMENDA XR) 24 hr capsule 28 mg  28 mg Oral q morning - 10a Carole Civil, MD   28 mg at 03/29/18 0925  . metoCLOPramide (REGLAN) tablet 5-10 mg  5-10 mg Oral Q8H PRN Carole Civil, MD       Or  . metoCLOPramide (REGLAN) injection 5-10 mg  5-10 mg Intravenous Q8H PRN Carole Civil, MD      . metoprolol tartrate (LOPRESSOR) tablet 12.5 mg  12.5 mg Oral BID Carole Civil, MD   12.5 mg at 03/29/18 1093  . morphine 2 MG/ML injection 2 mg  2 mg Intravenous Q2H PRN Carole Civil, MD      . ondansetron [facility]) tablet 4 mg  4 mg Oral Q6H PRN Carole Civil, MD       Or  . ondansetron [facility]) injection 4 mg  4 mg Intravenous Q6H PRN Carole Civil, MD   4 mg at 03/28/18 1836  . phenol (CHLORASEPTIC) mouth spray 1 spray  1 spray Mouth/Throat PRN Carole Civil, MD      . polyethylene glycol (MIRALAX / GLYCOLAX) packet 17 g  17 g Oral Daily Carole Civil, MD   17 g at 03/29/18 2355  . senna (SENOKOT) tablet 17.2 mg  2 tablet Oral Daily Carole Civil, MD   17.2 mg at 03/29/18 0926  . traMADol (ULTRAM) tablet 50 mg  50 mg Oral Q6H PRN Carole Civil, MD   50 mg at 03/29/18 1114     Discharge Medications: Please see discharge summary for a list of discharge medications.  Relevant Imaging Results:  Relevant Lab Results:   Additional Information SSN [id number], Clydene Pugh, LCSW

## 2018-03-29 NOTE — Clinical Social Work Note (Signed)
Clinical Social Work Assessment  Patient Details  Name: Betty [name] MRN: [id number] Date of Birth: 03/17/1931  Date of referral:  03/29/18               Reason for consult:  Facility Placement, Discharge Planning                Permission sought to share information with:  Facility Contact Representative Permission granted to share information::  Yes, Verbal Permission Granted  Name::        Agency::  [facility]  Relationship::     Contact Information:     Housing/Transportation Living arrangements for the past 2 months:  Skilled Nursing Facility, Single Family Home Source of Information:  Patient, Adult Children Patient Interpreter Needed:  None Criminal Activity/Legal Involvement Pertinent to Current Situation/Hospitalization:  No - Comment as needed Significant Relationships:  Adult Children Lives with:  Self Do you feel safe going back to the place where you live?  Yes Need for family participation in patient care:  Yes (Comment)  Care giving concerns: PT recommends short term rehab at SNF.   Social Worker assessment / plan: Pt is an 84 year old female referred to CSW for SNF rehab placement. Met with pt and family today to assess. Pt lives alone. She had been independent in ADLs prior to admission. Pt reluctantly agreeable to SNF placement. Family only wants PNC per daughter, Tammy. Will send referral and follow for dc planning.  Employment status:  Retired Insurance information:  Managed Medicare PT Recommendations:  Skilled Nursing Facility Information / Referral to community resources:  Skilled Nursing Facility  Patient/Family's Response to care: Pt accepting of care.  Patient/Family's Understanding of and Emotional Response to Diagnosis, Current Treatment, and Prognosis: Pt appears to have basic understanding of diagnosis and treatment recommendations. Pt verbalizes not really wanting to go to SNF. She understands that family feels its best and is  agreeable.  Emotional Assessment Appearance:  Appears younger than stated age Attitude/Demeanor/Rapport:  Engaged Affect (typically observed):  Pleasant Orientation:  Oriented to Self, Oriented to Place Alcohol / Substance use:  Not Applicable Psych involvement (Current and /or in the community):  No (Comment)  Discharge Needs  Concerns to be addressed:  Discharge Planning Concerns Readmission within the last 30 days:  No Current discharge risk:  Lives alone, Physical Impairment Barriers to Discharge:  Insurance Authorization    , LCSW 03/29/2018, 12:36 PM  

## 2018-03-29 NOTE — Evaluation (Signed)
Clinical/Bedside Swallow Evaluation Patient Details  Name: Betty [name] MRN: [id number] Date of Birth: [birth year]  Today's Date: 03/29/2018 Time: SLP Start Time (ACUTE ONLY): 1331 SLP Stop Time (ACUTE ONLY): 1350 SLP Time Calculation (min) (ACUTE ONLY): 19 min  Past Medical History:  Past Medical History:  Diagnosis Date  . Chronic kidney disease    stage II  . Dementia   . Diabetes mellitus   . Hyperlipidemia   . Hypertension    Past Surgical History:  Past Surgical History:  Procedure Laterality Date  . ABDOMINAL HYSTERECTOMY    . BACK SURGERY    . INTRAMEDULLARY (IM) NAIL INTERTROCHANTERIC Left 03/28/2018   Procedure: INTRAMEDULLARY (IM) NAIL INTERTROCHANTRIC;  Surgeon: Carole Civil, MD;  Location: [facility];  Service: Orthopedics;  Laterality: Left;   HPI:  84 y.o. female with medical history significant for dementia with sundowning, CKD stage III, type 2 diabetes, dyslipidemia, and hypertension who was brought to the emergency department after being found down at home.  Patient is slightly confused, but states that she tripped on some close and carpeting on her floor.  She denies any lightheadedness or dizziness.  Family members found her on the living room floor sometime afterwards.  Patient cannot recall how long she has been on the floor.  She does live with her son at home.  She denies any head or neck pain and does not recall hitting her head.03/26/18 CXR negative.  Assessment / Plan / Recommendation Clinical Impression   Pt exhibited a normal oropharyngeal swallow with all consistencies assessed ranging from thin via straw/cup-solids without overt s/s of aspiration; nursing stated pt took medications without dysphagia symptoms and family denies hx of dysphagia with regular/thin liquid diet given at home; continue current diet of Regular/thin liquids; ST will s/o at this time d/t no therapy warranted; thank you for this consult. SLP Visit Diagnosis: Dysphagia,  unspecified (R13.10)    Aspiration Risk  No limitations    Diet Recommendation   Regular/thin liquids  Medication Administration: Whole meds with liquid    Other  Recommendations Oral Care Recommendations: Oral care BID   Follow up Recommendations 24 hour supervision/assistance      Frequency and Duration   n/a d/t evaluation only         Prognosis Prognosis for Safe Diet Advancement: Good      Swallow Study   General Date of Onset: 03/26/18 HPI: 84 y.o. female with medical history significant for dementia with sundowning, CKD stage III, type 2 diabetes, dyslipidemia, and hypertension who was brought to the emergency department after being found down at home.  Patient is slightly confused, but states that she tripped on some close and carpeting on her floor.  She denies any lightheadedness or dizziness.  Family members found her on the living room floor sometime afterwards.  Patient cannot recall how long she has been on the floor.  She does live with her son at home.  She denies any head or neck pain and does not recall hitting her head. Type of Study: Bedside Swallow Evaluation Previous Swallow Assessment: n/a Diet Prior to this Study: Regular;Thin liquids Temperature Spikes Noted: No Respiratory Status: Room air History of Recent Intubation: No Behavior/Cognition: Alert;Cooperative Oral Cavity Assessment: Within Functional Limits Oral Care Completed by SLP: No Oral Cavity - Dentition: Adequate natural dentition Vision: Functional for self-feeding Self-Feeding Abilities: Able to feed self;Needs assist Patient Positioning: Upright in bed Baseline Vocal Quality: Normal Volitional Cough: Strong Volitional Swallow: Able to elicit  Oral/Motor/Sensory Function Overall Oral Motor/Sensory Function: Within functional limits   Ice Chips Ice chips: Not tested   Thin Liquid Thin Liquid: Within functional limits Presentation: Cup;Straw    Nectar Thick Nectar Thick Liquid: Not  tested   Honey Thick Honey Thick Liquid: Not tested   Puree Puree: Within functional limits Presentation: Spoon   Solid      Solid: Within functional limits Presentation: Self Fed        Elvina Sidle, M.S., CCC-SLP 03/29/2018,1:44 PM

## 2018-03-29 NOTE — Telephone Encounter (Signed)
Call placed to patient and patient son Betty [name] made aware.

## 2018-03-29 NOTE — Progress Notes (Signed)
Physical Therapy Treatment Patient Details Name: Betty [name] MRN: [id number] DOB: June 16, 1931 Today's Date: 03/29/2018    History of Present Illness 84 year old woman admitted from home on 6/24 after being found down at home. H/O  dementia, stage III chronic kidney disease, hyperlipidemia, hypertension and diabetes mellitus. X-rays revealed an acute varus angulated intertrochanteric left femur fracture. S/P intramedullary nail  on 03/28/18    PT Comments    The  patient tolerated mobilizing to sitting on the edge 74f the bed with 2 assist. Attempted standing, unable to clear bed. Continue PT.  Follow Up Recommendations  SNF     Equipment Recommendations  None recommended by PT    Recommendations for Other Services       Precautions / Restrictions Precautions Precautions: Fall Restrictions Weight Bearing Restrictions: No LLE Weight Bearing: Weight bearing as tolerated    Mobility  Bed Mobility Overal bed mobility: Needs Assistance Bed Mobility: Supine to Sit;Sit to Supine     Supine to sit: Max assist;+2 for physical assistance;+2 for safety/equipment;HOB elevated Sit to supine: Total assist;+2 for physical assistance;+2 for safety/equipment   General bed mobility comments: use  of bed pad for sliding and turning on bed, patient did self asssit  pulling self to left  prior to sitting up. Required assist with legs to bed edge and back into bed, assist with trunk.  Transfers Overall transfer level: Needs assistance Equipment used: Rolling walker (2 wheeled) Transfers: Sit to/from Stand Sit to Stand: Max assist         General transfer comment: attempted to stand x 3 from bed. Did not clear buttocks from bed.May be  more successful with 2 assist.  Ambulation/Gait                 Stairs             Wheelchair Mobility    Modified Rankin (Stroke Patients Only)       Balance Overall balance assessment: Needs assistance;History of  Falls Sitting-balance support: Feet supported;No upper extremity supported Sitting balance-Leahy Scale: Fair                                      Cognition Arousal/Alertness: Awake/alert Behavior During Therapy: WFL for tasks assessed/performed Overall Cognitive Status: History of cognitive impairments - at baseline                                        Exercises      General Comments        Pertinent Vitals/Pain Pain Assessment: Faces Faces Pain Scale: Hurts even more Pain Location:  left hip and thigh Pain Descriptors / Indicators: Discomfort;Grimacing;Guarding Pain Intervention(s): Limited activity within patient's tolerance;Monitored during session;Premedicated before session;Repositioned;Ice applied    Home Living Family/patient expects to be discharged to:: Skilled nursing facility                    Prior Function Level of Independence: Independent          PT Goals (current goals can now be found in the care plan section) Acute Rehab PT Goals Patient Stated Goal: per family, to walk again PT Goal Formulation: With family Time For Goal Achievement: 04/12/18 Potential to Achieve Goals: Fair Progress towards PT goals: Progressing toward goals    Frequency  Min 2X/week      PT Plan Current plan remains appropriate    Co-evaluation              AM-PAC PT "6 Clicks" Daily Activity  Outcome Measure  Difficulty turning over in bed (including adjusting bedclothes, sheets and blankets)?: Unable Difficulty moving from lying on back to sitting on the side of the bed? : Unable Difficulty sitting down on and standing up from a chair with arms (e.g., wheelchair, bedside commode, etc,.)?: Unable Help needed moving to and from a bed to chair (including a wheelchair)?: Total Help needed walking in hospital room?: Total Help needed climbing 3-5 steps with a railing? : Total 6 Click Score: 6    End of Session  Equipment Utilized During Treatment: Gait belt Activity Tolerance: Patient tolerated treatment well Patient left: in bed;with call bell/phone within reach;with bed alarm set;with nursing/sitter in room;with family/visitor present Nurse Communication: Mobility status PT Visit Diagnosis: Unsteadiness on feet (R26.81)     Time: 1300-1330 PT Time Calculation (min) (ACUTE ONLY): 30 min  Charges:  $Therapeutic Activity: 23-37 mins                    G CodesTresa Endo PT [phone]   Claretha Cooper 03/29/2018, 1:47 PM

## 2018-03-29 NOTE — Evaluation (Signed)
Physical Therapy Evaluation Patient Details Name: Betty [name] MRN: [id number] DOB: Aug 08, 1931 Today's Date: 03/29/2018   History of Present Illness  84 year old woman admitted from home on 6/24 after being found down at home. H/O  dementia, stage III chronic kidney disease, hyperlipidemia, hypertension and diabetes mellitus. X-rays revealed an acute varus angulated intertrochanteric left femur fracture. S/P intramedullary nail  on 03/28/18  Clinical Impression  Limited evaluation due to pain. RN to medicate , then PT will  Further assess Mobility this PM. Pt admitted with above diagnosis. Pt currently with functional limitations due to the deficits listed below (see PT Problem List).  Pt will benefit from skilled PT to increase their independence and safety with mobility to allow discharge to the venue listed below.   i    Follow Up Recommendations SNF    Equipment Recommendations  None recommended by PT    Recommendations for Other Services       Precautions / Restrictions Precautions Precautions: Fall Restrictions Weight Bearing Restrictions: No LLE Weight Bearing: Weight bearing as tolerated      Mobility  Bed Mobility Overal bed mobility: Needs Assistance Bed Mobility: Supine to Sit           General bed mobility comments: unable to  assess due to increased pain at the left  hip/leg when attempted to move. will assess further after medicated.  Transfers                    Ambulation/Gait                Stairs            Wheelchair Mobility    Modified Rankin (Stroke Patients Only)       Balance                                             Pertinent Vitals/Pain Pain Assessment: Faces Faces Pain Scale: Hurts whole lot Pain Location: lert ankle and left hip Pain Intervention(s): Limited activity within patient's tolerance;Monitored during session;Patient requesting pain meds-RN notified;Ice applied    Home  Living Family/patient expects to be discharged to:: Skilled nursing facility                      Prior Function Level of Independence: Independent               Hand Dominance        Extremity/Trunk Assessment   Upper Extremity Assessment Upper Extremity Assessment: Generalized weakness    Lower Extremity Assessment Lower Extremity Assessment: LLE deficits/detail LLE Deficits / Details: ankle is plantarflexed and inverted. Assisted to  perform AAROM . patient gradually able to flex ankle. Placed roll  to support foot in more neutral       Communication   Communication: No difficulties  Cognition Arousal/Alertness: Awake/alert Behavior During Therapy: WFL for tasks assessed/performed Overall Cognitive Status: History of cognitive impairments - at baseline                                        General Comments      Exercises     Assessment/Plan    PT Assessment Patient needs continued PT services  PT Problem List Decreased strength;Decreased cognition;Decreased range of  motion;Decreased knowledge of use of DME;Decreased activity tolerance;Decreased safety awareness;Pain;Decreased mobility       PT Treatment Interventions DME instruction;Functional mobility training;Therapeutic activities;Gait training;Therapeutic exercise;Patient/family education    PT Goals (Current goals can be found in the Care Plan section)  Acute Rehab PT Goals Patient Stated Goal: per family, to walk again PT Goal Formulation: With family Time For Goal Achievement: 04/12/18 Potential to Achieve Goals: Fair    Frequency Min 2X/week   Barriers to discharge Decreased caregiver support      Co-evaluation               AM-PAC PT "6 Clicks" Daily Activity  Outcome Measure Difficulty turning over in bed (including adjusting bedclothes, sheets and blankets)?: Unable Difficulty moving from lying on back to sitting on the side of the bed? :  Unable Difficulty sitting down on and standing up from a chair with arms (e.g., wheelchair, bedside commode, etc,.)?: Unable Help needed moving to and from a bed to chair (including a wheelchair)?: Total Help needed walking in hospital room?: Total Help needed climbing 3-5 steps with a railing? : Total 6 Click Score: 6    End of Session   Activity Tolerance: Patient limited by pain Patient left: in bed;with call bell/phone within reach;with family/visitor present Nurse Communication: Mobility status;Patient requests pain meds PT Visit Diagnosis: Unsteadiness on feet (R26.81)    Time: 2174-7159 PT Time Calculation (min) (ACUTE ONLY): 12 min   Charges:   PT Evaluation $PT Eval Low Complexity: 1 Low     PT G CodesTresa Endo PT [phone]   Claretha Cooper 03/29/2018, 11:17 AM

## 2018-03-30 ENCOUNTER — Ambulatory Visit (INDEPENDENT_AMBULATORY_CARE_PROVIDER_SITE_OTHER): Payer: Medicare HMO | Admitting: Family Medicine

## 2018-03-30 DIAGNOSIS — Z7189 Other specified counseling: Secondary | ICD-10-CM | POA: Diagnosis not present

## 2018-03-30 DIAGNOSIS — F0150 Vascular dementia without behavioral disturbance: Secondary | ICD-10-CM

## 2018-03-30 DIAGNOSIS — R69 Illness, unspecified: Secondary | ICD-10-CM | POA: Diagnosis not present

## 2018-03-30 LAB — GLUCOSE, CAPILLARY
GLUCOSE-CAPILLARY: 99 mg/dL (ref 70–99)
GLUCOSE-CAPILLARY: 99 mg/dL (ref 70–99)
GLUCOSE-CAPILLARY: 276 mg/dL — AB (ref 70–99)
GLUCOSE-CAPILLARY: 246 mg/dL — AB (ref 70–99)
Glucose-Capillary: 100 mg/dL — ABNORMAL HIGH (ref 70–99)
Glucose-Capillary: 208 mg/dL — ABNORMAL HIGH (ref 70–99)

## 2018-03-30 LAB — CBC
HEMATOCRIT: 32.8 % — AB (ref 36.0–46.0)
Hemoglobin: 10.7 g/dL — ABNORMAL LOW (ref 12.0–15.0)
MCH: 29 pg (ref 26.0–34.0)
MCHC: 32.6 g/dL (ref 30.0–36.0)
MCV: 88.9 fL (ref 78.0–100.0)
Platelets: 121 10*3/uL — ABNORMAL LOW (ref 150–400)
RBC: 3.69 MIL/uL — AB (ref 3.87–5.11)
RDW: 15.3 % (ref 11.5–15.5)
WBC: 11.4 10*3/uL — AB (ref 4.0–10.5)

## 2018-03-30 LAB — BASIC METABOLIC PANEL
ANION GAP: 6 (ref 5–15)
BUN: 24 mg/dL — AB (ref 8–23)
CHLORIDE: 107 mmol/L (ref 98–111)
CO2: 27 mmol/L (ref 22–32)
Calcium: 8.2 mg/dL — ABNORMAL LOW (ref 8.9–10.3)
Creatinine, Ser: 1.27 mg/dL — ABNORMAL HIGH (ref 0.44–1.00)
GFR calc Af Amer: 43 mL/min — ABNORMAL LOW (ref >60)
GFR calc non Af Amer: 37 mL/min — ABNORMAL LOW (ref >60)
GLUCOSE: 87 mg/dL (ref 70–99)
POTASSIUM: 4.3 mmol/L (ref 3.5–5.1)
Sodium: 140 mmol/L (ref 135–145)

## 2018-03-30 LAB — CK: CK TOTAL: 602 U/L — AB (ref 38–234)

## 2018-03-30 MED ORDER — ALPRAZOLAM 0.25 MG PO TABS: 0.2500 mg | ORAL_TABLET | Freq: Every evening | ORAL | 2 refills | Status: DC | PRN

## 2018-03-30 MED ORDER — ASPIRIN 325 MG PO TBEC: 325.0000 mg | DELAYED_RELEASE_TABLET | Freq: Every day | ORAL | 0 refills | Status: DC

## 2018-03-30 MED ORDER — ACETAMINOPHEN 325 MG PO TABS: 650.0000 mg | ORAL_TABLET | Freq: Four times a day (QID) | ORAL | Status: DC | PRN

## 2018-03-30 MED ORDER — PHENOL 1.4 % MT LIQD: 1.0000 | OROMUCOSAL | 0 refills | Status: DC | PRN

## 2018-03-30 NOTE — Discharge Summary (Signed)
Physician Discharge Summary  Betty [name] BPZ:[id number] DOB: 04-15-1931 DOA: 03/26/2018  PCP: Alycia Rossetti, MD  Admit date: 03/26/2018 Discharge date: 03/30/2018  Time spent: 45 minutes  Recommendations for Outpatient Follow-up:  -To be discharged to skilled nursing facility as soon as bed is available and insurance authorization has been received. -Aspirin 325 mg daily for post hip repair DVT prophylaxis as per orthopedic recommendations.  Discharge Diagnoses:  Principal Problem:   Hip fracture ([place]) Active Problems:   Type II diabetes mellitus with renal manifestations ([facility])   Essential hypertension, benign   Hyperlipidemia   Anxiety   Dementia   CKD (chronic kidney disease), stage III ([place])   Closed intertrochanteric fracture of femur ([place])   Discharge Condition: Stable and improved  Filed Weights   03/26/18 1954 03/27/18 0128  Weight: 61.2 kg (135 lb) 57.1 kg (125 lb 14.1 oz)    History of present illness:  As per Dr. Manuella Ghazi on 6/24: Betty [name] is a 84 y.o. female with medical history significant for dementia with sundowning, CKD stage III, type 2 diabetes, dyslipidemia, and hypertension who was brought to the emergency department after being found down at home.  Patient is slightly confused, but states that she tripped on some close and carpeting on her floor.  She denies any lightheadedness or dizziness.  Family members found her on the living room floor sometime afterwards.  Patient cannot recall how long she has been on the floor.  She does live with her son at home.  She denies any head or neck pain and does not recall hitting her head.   ED Course: Vital signs are stable and laboratory data indicates leukocytosis of 16,000 and hemoglobin 8.5.  No overt bleeding noted.  BUN 42 and creatinine 1.69 which is stable per her baseline.  Glucose is noted to be 306.    Hospital Course:   Closed left intertrochanteric femur fracture -Seen by  orthopedics who performed intraoperative repair on 6/26.  Had a left intramedullary intertrochanteric nail placement. -PT evaluation: SNF placement for ST-rehab s/p hip fracture. -DVT prophylaxis as per orthopedics, it appears for now the plan is for full dose aspirin.    Essential hypertension -Fair control. -Continue beta-blocker. -Losartan and hydrochlorothiazide remain on hold due to acute on chronic kidney dysfunction.  Acute on chronic kidney disease stage III -Baseline creatinine appears between 1.2 and 1.5 -Creatinine was 1.75 on admission, back to baseline of 1.27 on DC. -Suspect acute component due to prerenal azotemia given dementia and dehydration.  Type 2 diabetes -CBGs with improved. -Continue to follow as an OP for further dosing adjustments.  Dementia with behavioral disturbance -Continue Aricept and Namenda  Hyperlipidemia -Continue statin on DC.  Anxiety -Continue home dose alprazolam    Procedures:  Left hip fracture repair on 6/26  Consultations:  Orthopedics, Dr. [name]  Discharge Instructions  Discharge Instructions    Diet - low sodium heart healthy   Complete by:  As directed    Increase activity slowly   Complete by:  As directed      Allergies as of 03/30/2018   No Known Allergies     Medication List    STOP taking these medications   hydrochlorothiazide 12.5 MG tablet Commonly known as:  HYDRODIURIL   losartan 100 MG tablet Commonly known as:  COZAAR     TAKE these medications   acetaminophen 325 MG tablet Commonly known as:  TYLENOL Take 2 tablets (650 mg total) by mouth  every 6 (six) hours as needed for mild pain (or Fever >/= 101).   ALPRAZolam 0.25 MG tablet Commonly known as:  XANAX Take 1 tablet (0.25 mg total) by mouth at bedtime as needed.   aspirin 325 MG EC tablet Take 1 tablet (325 mg total) by mouth daily with breakfast. Start taking on:  03/31/2018   glipiZIDE 5 MG tablet Commonly known as:   GLUCOTROL take 1 tablet by mouth QD with Breakfast   glucose blood test strip Use as instructed for once daily testing  With Easymax meter   Lancets Misc Use as directed to monitor FSBS 2x daily. Dx: E11.9.   metoprolol tartrate 25 MG tablet Commonly known as:  LOPRESSOR take 1 tablet by mouth twice a day with food for high blood pressure   NAMZARIC 28-10 MG Cp24 Generic drug:  Memantine HCl-Donepezil HCl TAKE 1 CAPSULE BY MOUTH ONCE DAILY(BEGIN AFTER TITRATION PACK IS COMPLETED   phenol 1.4 % Liqd Commonly known as:  CHLORASEPTIC Use as directed 1 spray in the mouth or throat as needed for throat irritation / pain.   rosuvastatin 20 MG tablet Commonly known as:  CRESTOR take 1 tablet by mouth at bedtime FOR CHOLESTEROL      No Known Allergies    The results of significant diagnostics from this hospitalization (including imaging, microbiology, ancillary and laboratory) are listed below for reference.    Significant Diagnostic Studies: Dg Chest 1 View  Result Date: 03/26/2018 CLINICAL DATA:  Unwitnessed fall at home.  Preop left hip fracture. EXAM: CHEST  1 VIEW COMPARISON:  01/11/2016 FINDINGS: Heart is top-normal in size. Moderate aortic atherosclerosis with ectasia. Clear lungs. No acute osseous abnormality. IMPRESSION: Aortic atherosclerosis.  No active pulmonary disease. Electronically Signed   By: Ashley Royalty M.D.   On: 03/26/2018 22:16   Dg Hip Operative Unilat W Or W/o Pelvis Left  Result Date: 03/28/2018 CLINICAL DATA:  Left hip fracture. EXAM: OPERATIVE left HIP (WITH PELVIS IF PERFORMED) 8 VIEWS TECHNIQUE: Fluoroscopic spot image(s) were submitted for interpretation post-operatively. Radiation exposure index: 21.9 mGy. COMPARISON:  Radiographs of March 26, 2018. FINDINGS: Eight fluoroscopic images of the left hip demonstrate the patient be status post surgical internal fixation of intertrochanteric fracture of proximal left femur. IMPRESSION: Status post surgical  internal fixation of proximal left femoral intertrochanteric fracture. Electronically Signed   By: Marijo Conception, M.D.   On: 03/28/2018 14:45   Dg Hip Unilat With Pelvis 2-3 Views Left  Result Date: 03/26/2018 CLINICAL DATA:  Unwitnessed fall at home today.  Pain. EXAM: DG HIP (WITH OR WITHOUT PELVIS) 2-3V LEFT COMPARISON:  None. FINDINGS: Acute, varus angulated intertrochanteric fracture of the left femur with avulsed lesser trochanter. Status post L4-5 lumbar fusion with hardware in place. No acute pelvic fracture. The native right hip appears intact. IMPRESSION: Acute, varus angulated intertrochanteric fracture of the left femur with avulsed lesser trochanter. Electronically Signed   By: Ashley Royalty M.D.   On: 03/26/2018 22:08    Microbiology: Recent Results (from the past 240 hour(s))  Surgical pcr screen     Status: None   Collection Time: 03/27/18 10:59 AM  Result Value Ref Range Status   MRSA, PCR NEGATIVE NEGATIVE Final   Staphylococcus aureus NEGATIVE NEGATIVE Final    Comment: (NOTE) The Xpert SA Assay (FDA approved for NASAL specimens in patients 57 years of age and older), is one component of a comprehensive surveillance program. It is not intended to diagnose infection nor to guide  or monitor treatment. Performed at [facility], [address] [place], [place] [address]      Labs: Basic Metabolic Panel: Recent Labs  Lab 03/26/18 2059 03/27/18 0556 03/28/18 0602 03/29/18 0453 03/30/18 0423  NA 141 144 139 139 140  K 3.9 3.5 4.1 4.1 4.3  CL 107 108 106 104 107  CO2 23 24 26 27 27   GLUCOSE 306* 140* 191* 105* 87  BUN 42* 43* 37* 28* 24*  CREATININE 1.69* 1.75* 1.44* 1.28* 1.27*  CALCIUM 8.4* 8.5* 8.0* 7.8* 8.2*   Liver Function Tests: No results for input(s): AST, ALT, ALKPHOS, BILITOT, PROT, ALBUMIN in the last 168 hours. No results for input(s): LIPASE, AMYLASE in the last 168 hours. No results for input(s): AMMONIA in the last 168 hours. CBC: Recent Labs    Lab 03/26/18 2059 03/27/18 0556 03/28/18 0602 03/29/18 0453 03/30/18 0423  WBC 16.0* 12.4* 13.4* 13.2* 11.4*  NEUTROABS 14.7  --   --   --   --   HGB 8.5* 8.3* 11.4* 10.4* 10.7*  HCT 26.4* 25.7* 34.3* 30.9* 32.8*  MCV 84.6 84.3 86.6 87.5 88.9  PLT 155 162 116* 109* 121*   Cardiac Enzymes: Recent Labs  Lab 03/26/18 2059 03/30/18 0423  CKTOTAL 274* 602*   BNP: BNP (last 3 results) No results for input(s): BNP in the last 8760 hours.  ProBNP (last 3 results) No results for input(s): PROBNP in the last 8760 hours.  CBG: Recent Labs  Lab 03/29/18 2038 03/30/18 0020 03/30/18 0342 03/30/18 0731 03/30/18 1110  GLUCAP 183* 100* 99 99 276*       Signed:  Lelon Frohlich  [facility] Pager: [phone] 03/30/2018, 3:46 PM

## 2018-03-30 NOTE — Clinical Social Work Note (Signed)
Pt has a bed offer at [facility]. Likely will transfer on Monday after insurance authorization. Message left with pt's daughter, Lynelle Smoke, to update. Her cell number is [phone].

## 2018-03-30 NOTE — Care Management Important Message (Signed)
Important Message  Patient Details  Name: Betty [name] MRN: [id number] Date of Birth: 1930/11/30   Medicare Important Message Given:  Yes    Shelda Altes 03/30/2018, 11:56 AM

## 2018-03-31 DIAGNOSIS — F0150 Vascular dementia without behavioral disturbance: Secondary | ICD-10-CM

## 2018-03-31 LAB — CBC
HCT: 31.3 % — ABNORMAL LOW (ref 36.0–46.0)
Hemoglobin: 10.2 g/dL — ABNORMAL LOW (ref 12.0–15.0)
MCH: 29.1 pg (ref 26.0–34.0)
MCHC: 32.6 g/dL (ref 30.0–36.0)
MCV: 89.2 fL (ref 78.0–100.0)
PLATELETS: 145 10*3/uL — AB (ref 150–400)
RBC: 3.51 MIL/uL — ABNORMAL LOW (ref 3.87–5.11)
RDW: 15.4 % (ref 11.5–15.5)
WBC: 13.3 10*3/uL — ABNORMAL HIGH (ref 4.0–10.5)

## 2018-03-31 LAB — GLUCOSE, CAPILLARY
GLUCOSE-CAPILLARY: 136 mg/dL — AB (ref 70–99)
GLUCOSE-CAPILLARY: 208 mg/dL — AB (ref 70–99)
GLUCOSE-CAPILLARY: 140 mg/dL — AB (ref 70–99)
Glucose-Capillary: 175 mg/dL — ABNORMAL HIGH (ref 70–99)
Glucose-Capillary: 188 mg/dL — ABNORMAL HIGH (ref 70–99)
Glucose-Capillary: 260 mg/dL — ABNORMAL HIGH (ref 70–99)

## 2018-03-31 MED ORDER — INSULIN ASPART 100 UNIT/ML ~~LOC~~ SOLN
0.0000 [IU] | Freq: Three times a day (TID) | SUBCUTANEOUS | Status: DC
  Administered 2018-04-01 (×2): 1 [IU] via SUBCUTANEOUS
  Administered 2018-04-01 – 2018-04-02 (×2): 2 [IU] via SUBCUTANEOUS

## 2018-03-31 NOTE — Discharge Summary (Signed)
Orthopedic discharge summary  Admitting diagnosis left intertrochanteric hip fracture  Discharge diagnosis same  Date of admission 03/26/2018   Discharge date pending  Procedure open treatment internal fixation left hip with gamma nail Date of surgery June 26  Orthopedic plan Weight-bear as tolerated Staples out postop day 14 Follow-up visit in 2 weeks X-ray at 2 weeks, 6 weeks and 12 weeks in the office if patient can travel if not they can be done at the hospital. DVT prevention for 28 days  9:08 AM 03/31/2018 Arther Abbott, MD    Current Facility-Administered Medications:  .  0.45 % NaCl with KCl 20 mEq / L infusion, , Intravenous, Continuous, Carole Civil, MD, Last Rate: 75 mL/hr at 03/31/18 0022 .  acetaminophen (TYLENOL) tablet 650 mg, 650 mg, Oral, Q6H PRN **OR** acetaminophen (TYLENOL) suppository 650 mg, 650 mg, Rectal, Q6H PRN, Carole Civil, MD .  ALPRAZolam Duanne Moron) tablet 0.25 mg, 0.25 mg, Oral, QHS, Carole Civil, MD, 0.25 mg at 03/30/18 2204 .  aspirin EC tablet 325 mg, 325 mg, Oral, Q breakfast, Carole Civil, MD, 325 mg at 03/30/18 0912 .  docusate sodium (COLACE) capsule 100 mg, 100 mg, Oral, BID, Carole Civil, MD, 100 mg at 03/30/18 2204 .  donepezil (ARICEPT) tablet 10 mg, 10 mg, Oral, Daily, Carole Civil, MD, 10 mg at 03/30/18 0911 .  insulin aspart (novoLOG) injection 0-9 Units, 0-9 Units, Subcutaneous, Q4H, Carole Civil, MD, 1 Units at 03/31/18 0410 .  insulin detemir (LEVEMIR) injection 5 Units, 5 Units, Subcutaneous, QHS, Isaac Bliss, Rayford Halsted, MD, 5 Units at 03/30/18 2205 .  LORazepam (ATIVAN) injection 0.5 mg, 0.5 mg, Intravenous, Q4H PRN, Carole Civil, MD, 0.5 mg at 03/30/18 1144 .  memantine (NAMENDA XR) 24 hr capsule 28 mg, 28 mg, Oral, q morning - 10a, Carole Civil, MD, 28 mg at 03/30/18 0912 .  metoCLOPramide (REGLAN) tablet 5-10 mg, 5-10 mg, Oral, Q8H PRN **OR** metoCLOPramide  (REGLAN) injection 5-10 mg, 5-10 mg, Intravenous, Q8H PRN, Carole Civil, MD .  metoprolol tartrate (LOPRESSOR) tablet 12.5 mg, 12.5 mg, Oral, BID, Carole Civil, MD, 12.5 mg at 03/30/18 2204 .  morphine 2 MG/ML injection 2 mg, 2 mg, Intravenous, Q2H PRN, Carole Civil, MD, 2 mg at 03/30/18 0504 .  ondansetron (ZOFRAN) tablet 4 mg, 4 mg, Oral, Q6H PRN **OR** ondansetron (ZOFRAN) injection 4 mg, 4 mg, Intravenous, Q6H PRN, Carole Civil, MD, 4 mg at 03/28/18 1836 .  phenol (CHLORASEPTIC) mouth spray 1 spray, 1 spray, Mouth/Throat, PRN, Carole Civil, MD .  polyethylene glycol (MIRALAX / GLYCOLAX) packet 17 g, 17 g, Oral, Daily, Carole Civil, MD, 17 g at 03/30/18 0912 .  senna (SENOKOT) tablet 17.2 mg, 2 tablet, Oral, Daily, Carole Civil, MD, 17.2 mg at 03/30/18 0912 .  traMADol (ULTRAM) tablet 50 mg, 50 mg, Oral, Q6H PRN, Carole Civil, MD, 50 mg at 03/29/18 1114

## 2018-03-31 NOTE — Progress Notes (Signed)
Provided spiritual and emotional support to patient and her son-in-law. Patient did not express no immediate need at this time. However, she said "I know lot of people are praying for her healing."

## 2018-03-31 NOTE — Progress Notes (Signed)
Patient briefly seen and examined, database reviewed.  Discussed with son at bedside.  Patient was admitted after a fall resulted in a hip fracture; this was repaired on 6/26.  She was discharged to SNF on 6/28, however has remained hospitalized due to lack of insurance authorization.  She remains medically stable for discharge at this point.  She is in bed, is pleasantly confused with her dementia but otherwise is doing well.  Vital signs as follow: Vitals:   03/31/18 1437 03/31/18 1825  BP: 122/63 (!) 125/52  Pulse: 86 84  Resp: 18 20  Temp: 98.7 F (37.1 C) 98.9 F (37.2 C)  SpO2: 99% 100%   Will continue to follow.  Domingo Mend, MD [facility] Pager: [phone]

## 2018-03-31 NOTE — Progress Notes (Signed)
Patient ID: Betty [name], female   DOB: May 14, 1931, 84 y.o.   MRN: [id number]  POD 3   BP 127/63 (BP Location: Right Arm)   Pulse 93   Temp 98.1 F (36.7 C) (Oral)   Resp 16   Ht 5\' 1"  (1.549 m)   Wt 125 lb 14.1 oz (57.1 kg)   SpO2 100%   BMI 23.79 kg/m   WOUND IS CLEAN   SLOW PROGRESS WITH PT   OK TO GO TO SKILLED    The  patient tolerated mobilizing to sitting on the edge 46f the bed with 2 assist. Attempted standing, unable to clear bed. Continue PT.   Follow Up Recommendations   SNF      Equipment Recommendations   None recommended by PT     Recommendations for Other Services        Precautions / Restrictions Precautions Precautions: Fall Restrictions Weight Bearing Restrictions: No LLE Weight Bearing: Weight bearing as tolerated     Mobility   Bed Mobility Overal bed mobility: Needs Assistance Bed Mobility: Supine to Sit;Sit to Supine Supine to sit: Max assist;+2 for physical assistance;+2 for safety/equipment;HOB elevated Sit to supine: Total assist;+2 for physical assistance;+2 for safety/equipment General bed mobility comments: use  of bed pad for sliding and turning on bed, patient did self asssit  pulling self to left  prior to sitting up. Required assist with legs to bed edge and back into bed, assist with trunk.   Transfers Overall transfer level: Needs assistance Equipment used: Rolling walker (2 wheeled) Transfers: Sit to/from Stand Sit to Stand: Max assist General transfer comment: attempted to stand x 3 from bed. Did not clear buttocks from bed.May be  more successful with 2 assist.   Ambulation/Gait     Stairs     Wheelchair Mobility   Modified Rankin (Stroke Patients Only)        Balance Overall balance assessment: Needs assistance;History of Falls Sitting-balance support: Feet supported;No upper extremity supported Sitting balance-Leahy Scale: Fair      Cognition Arousal/Alertness: Awake/alert Behavior During Therapy: WFL  for tasks assessed/performed Overall Cognitive Status: History of cognitive impairments - at baseline

## 2018-04-01 LAB — GLUCOSE, CAPILLARY
GLUCOSE-CAPILLARY: 127 mg/dL — AB (ref 70–99)
GLUCOSE-CAPILLARY: 168 mg/dL — AB (ref 70–99)
GLUCOSE-CAPILLARY: 196 mg/dL — AB (ref 70–99)
Glucose-Capillary: 148 mg/dL — ABNORMAL HIGH (ref 70–99)

## 2018-04-01 NOTE — Progress Notes (Signed)
Patient briefly seen and examined, database reviewed.  No family members at bedside.  She appears generally well.  She has had several loose bowel movements.  Discussed with RN to discontinue stool softener.  Vital signs as follow.  Vitals:   04/01/18 0750 04/01/18 1034  BP:  (!) 140/52  Pulse:  89  Resp:    Temp:  98.9 F (37.2 C)  SpO2: 98% 96%   She remains medically stable for discharge, awaiting skilled nursing facility placement for post hip fracture rehab.  Domingo Mend, MD [facility] Pager: [phone]

## 2018-04-01 NOTE — Progress Notes (Signed)
CSW spoke with Roselyn Reef from [facility].  Patient will not be able to discharge to facility on Sunday due to not having insurance auth.  Reed Breech LCSWA [phone]

## 2018-04-02 ENCOUNTER — Inpatient Hospital Stay
Admission: RE | Admit: 2018-04-02 | Discharge: 2018-05-05 | Disposition: A | Discharge status: Home / Self Care | Payer: Medicare HMO | Source: Ambulatory Visit | Attending: Internal Medicine | Admitting: Internal Medicine

## 2018-04-02 ENCOUNTER — Encounter: Payer: Self-pay | Admitting: Family Medicine

## 2018-04-02 DIAGNOSIS — Z4789 Encounter for other orthopedic aftercare: Secondary | ICD-10-CM | POA: Diagnosis not present

## 2018-04-02 DIAGNOSIS — Z967 Presence of other bone and tendon implants: Secondary | ICD-10-CM | POA: Diagnosis not present

## 2018-04-02 DIAGNOSIS — R600 Localized edema: Secondary | ICD-10-CM | POA: Diagnosis not present

## 2018-04-02 DIAGNOSIS — S72142D Displaced intertrochanteric fracture of left femur, subsequent encounter for closed fracture with routine healing: Secondary | ICD-10-CM | POA: Diagnosis not present

## 2018-04-02 DIAGNOSIS — F0150 Vascular dementia without behavioral disturbance: Secondary | ICD-10-CM | POA: Diagnosis not present

## 2018-04-02 DIAGNOSIS — R69 Illness, unspecified: Secondary | ICD-10-CM | POA: Diagnosis not present

## 2018-04-02 DIAGNOSIS — M6281 Muscle weakness (generalized): Secondary | ICD-10-CM | POA: Diagnosis not present

## 2018-04-02 DIAGNOSIS — E1169 Type 2 diabetes mellitus with other specified complication: Secondary | ICD-10-CM | POA: Diagnosis not present

## 2018-04-02 DIAGNOSIS — F419 Anxiety disorder, unspecified: Secondary | ICD-10-CM | POA: Diagnosis not present

## 2018-04-02 DIAGNOSIS — N183 Chronic kidney disease, stage 3 (moderate): Secondary | ICD-10-CM | POA: Diagnosis not present

## 2018-04-02 DIAGNOSIS — E1122 Type 2 diabetes mellitus with diabetic chronic kidney disease: Secondary | ICD-10-CM | POA: Diagnosis not present

## 2018-04-02 DIAGNOSIS — E785 Hyperlipidemia, unspecified: Secondary | ICD-10-CM | POA: Diagnosis not present

## 2018-04-02 DIAGNOSIS — E1129 Type 2 diabetes mellitus with other diabetic kidney complication: Secondary | ICD-10-CM | POA: Diagnosis not present

## 2018-04-02 DIAGNOSIS — Z872 Personal history of diseases of the skin and subcutaneous tissue: Secondary | ICD-10-CM | POA: Diagnosis not present

## 2018-04-02 DIAGNOSIS — I10 Essential (primary) hypertension: Secondary | ICD-10-CM | POA: Diagnosis not present

## 2018-04-02 DIAGNOSIS — M1712 Unilateral primary osteoarthritis, left knee: Secondary | ICD-10-CM | POA: Diagnosis not present

## 2018-04-02 DIAGNOSIS — S72141D Displaced intertrochanteric fracture of right femur, subsequent encounter for closed fracture with routine healing: Secondary | ICD-10-CM | POA: Diagnosis not present

## 2018-04-02 DIAGNOSIS — Z8781 Personal history of (healed) traumatic fracture: Secondary | ICD-10-CM | POA: Diagnosis not present

## 2018-04-02 DIAGNOSIS — R262 Difficulty in walking, not elsewhere classified: Secondary | ICD-10-CM | POA: Diagnosis not present

## 2018-04-02 DIAGNOSIS — M7989 Other specified soft tissue disorders: Secondary | ICD-10-CM | POA: Diagnosis not present

## 2018-04-02 DIAGNOSIS — M25551 Pain in right hip: Secondary | ICD-10-CM | POA: Diagnosis not present

## 2018-04-02 DIAGNOSIS — D649 Anemia, unspecified: Secondary | ICD-10-CM | POA: Diagnosis not present

## 2018-04-02 DIAGNOSIS — M25552 Pain in left hip: Secondary | ICD-10-CM | POA: Diagnosis not present

## 2018-04-02 DIAGNOSIS — F0151 Vascular dementia with behavioral disturbance: Secondary | ICD-10-CM | POA: Diagnosis not present

## 2018-04-02 DIAGNOSIS — S72145D Nondisplaced intertrochanteric fracture of left femur, subsequent encounter for closed fracture with routine healing: Secondary | ICD-10-CM | POA: Diagnosis not present

## 2018-04-02 DIAGNOSIS — E782 Mixed hyperlipidemia: Secondary | ICD-10-CM | POA: Diagnosis not present

## 2018-04-02 DIAGNOSIS — M79605 Pain in left leg: Secondary | ICD-10-CM | POA: Diagnosis not present

## 2018-04-02 DIAGNOSIS — Z9181 History of falling: Secondary | ICD-10-CM | POA: Diagnosis not present

## 2018-04-02 LAB — GLUCOSE, CAPILLARY
GLUCOSE-CAPILLARY: 105 mg/dL — AB (ref 70–99)
Glucose-Capillary: 195 mg/dL — ABNORMAL HIGH (ref 70–99)
Glucose-Capillary: 112 mg/dL — ABNORMAL HIGH (ref 70–99)

## 2018-04-02 NOTE — Care Management Important Message (Signed)
Important Message  Patient Details  Name: MARNISHA STAMPLEY MRN: [id number] Date of Birth: 07-Mar-1931   Medicare Important Message Given:  Yes    Shelda Altes 04/02/2018, 12:04 PM

## 2018-04-02 NOTE — Progress Notes (Signed)
Report called to [facility] and given to Mo, RN.

## 2018-04-02 NOTE — Progress Notes (Signed)
Patient voided 200cc but still complained of need to void. Bladder scanned and  showed greater than 900cc retained. In and out order obtained and completed, removing 1450 from patient's bladder.  Will continue to monitor.   Harless Litten, RN 04/02/2018 @ [phone]

## 2018-04-02 NOTE — Progress Notes (Signed)
   Subjective:    Patient ID: Betty [name], female    DOB: 1931/01/04, 84 y.o.   MRN: [id number]  HPI  Patient's daughter and son are here in the office.  She is currently hospitalized after sustaining a fall resulting in hip fracture.  They are here to discuss her ongoing care.  She will need skilled nursing facility for rehab for a few weeks but will then require care when she comes home.  She currently does not have Medicaid and they are inquiring about how to get this for her to help offset the bills.  She has underlying dementia as well as type 2 diabetes. 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 Family members currently make decisions but son will become POA. MOST form completed, as she did well with surgery they want to left the limited DNR, and make her full code for now and see how she progresses in the SNF. Will revisit if she declines. [name] to be completed for both children so they can assist mother   Will attempt to reach out to social worker with regards to medicaid as currently hospitalized and should qualify

## 2018-04-02 NOTE — Progress Notes (Signed)
Patient seen and examined, database reviewed.  No family members at bedside.  Patient  remains medically stable for transfer to skilled nursing facility for post hip fracture short-term rehabilitation.  Awaiting insurance authorization.  Vitals as follow:  Vitals:   04/01/18 2206 04/02/18 0623  BP: (!) 172/64 (!) 149/67  Pulse: 97 82  Resp: 16 15  Temp: 98.4 F (36.9 C) 98.4 F (36.9 C)  SpO2: 99% 100%   To proceed with SNF once disposition planning is finalized.  No changes to discharge summary dated 6/28.  Domingo Mend, MD [facility] Pager: [phone]

## 2018-04-02 NOTE — Clinical Social Work Placement (Signed)
   CLINICAL SOCIAL WORK PLACEMENT  NOTE  Date:  04/02/2018  Patient Details  Name: MAIRE GOVAN MRN: [id number] Date of Birth: 1931-08-05  Clinical Social Work is seeking post-discharge placement for this patient at the [place] level of care (*CSW will initial, date and re-position this form in  chart as items are completed):  Yes   Patient/family provided with [facility] Work Department's list of facilities offering this level of care within the geographic area requested by the patient (or if unable, by the patient's family).  Yes   Patient/family informed of their freedom to choose among providers that offer the needed level of care, that participate in Medicare, Medicaid or managed care program needed by the patient, have an available bed and are willing to accept the patient.  Yes   Patient/family informed of [facility]'s ownership interest in [facility] and [facility], as well as of the fact that they are under no obligation to receive care at these facilities.  PASRR submitted to EDS on 03/29/18     PASRR number received on 03/29/18     Existing PASRR number confirmed on       FL2 transmitted to all facilities in geographic area requested by pt/family on 03/29/18     FL2 transmitted to all facilities within larger geographic area on       Patient informed that his/her managed care company has contracts with or will negotiate with certain facilities, including the following:        Yes   Patient/family informed of bed offers received.  Patient chooses bed at [facility]     Physician recommends and patient chooses bed at      Patient to be transferred to [facility] on 04/02/18.  Patient to be transferred to facility by [facility] staff     Patient family notified on 04/02/18 of transfer.  Name of family member notified:  RN notified family at bedside.      PHYSICIAN       Additional Comment:  Discharge  clinicals sent. LCSW signing off.   _______________________________________________ Ihor Gully, LCSW 04/02/2018, 4:46 PM

## 2018-04-03 ENCOUNTER — Encounter: Payer: Self-pay | Admitting: Internal Medicine

## 2018-04-03 ENCOUNTER — Non-Acute Institutional Stay (SKILLED_NURSING_FACILITY): Payer: Medicare HMO | Admitting: Internal Medicine

## 2018-04-03 ENCOUNTER — Other Ambulatory Visit: Payer: Self-pay

## 2018-04-03 ENCOUNTER — Telehealth: Payer: Self-pay | Admitting: Family Medicine

## 2018-04-03 ENCOUNTER — Encounter (HOSPITAL_COMMUNITY)
Admission: RE | Admit: 2018-04-03 | Discharge: 2018-04-03 | Disposition: A | Discharge status: Home / Self Care | Payer: Medicare HMO | Source: Skilled Nursing Facility | Attending: Internal Medicine | Admitting: Internal Medicine

## 2018-04-03 DIAGNOSIS — R69 Illness, unspecified: Secondary | ICD-10-CM | POA: Diagnosis not present

## 2018-04-03 DIAGNOSIS — D649 Anemia, unspecified: Secondary | ICD-10-CM

## 2018-04-03 DIAGNOSIS — E1122 Type 2 diabetes mellitus with diabetic chronic kidney disease: Secondary | ICD-10-CM | POA: Diagnosis not present

## 2018-04-03 DIAGNOSIS — S72141D Displaced intertrochanteric fracture of right femur, subsequent encounter for closed fracture with routine healing: Secondary | ICD-10-CM

## 2018-04-03 DIAGNOSIS — N183 Chronic kidney disease, stage 3 unspecified: Secondary | ICD-10-CM

## 2018-04-03 DIAGNOSIS — F419 Anxiety disorder, unspecified: Secondary | ICD-10-CM

## 2018-04-03 DIAGNOSIS — I10 Essential (primary) hypertension: Secondary | ICD-10-CM | POA: Diagnosis not present

## 2018-04-03 DIAGNOSIS — F0391 Unspecified dementia with behavioral disturbance: Secondary | ICD-10-CM | POA: Insufficient documentation

## 2018-04-03 LAB — CBC WITH DIFFERENTIAL/PLATELET
BASOS ABS: 0 10*3/uL (ref 0.0–0.1)
Basophils Relative: 0 %
EOS PCT: 2 %
Eosinophils Absolute: 0.2 10*3/uL (ref 0.0–0.7)
HEMATOCRIT: 25.9 % — AB (ref 36.0–46.0)
Hemoglobin: 8.5 g/dL — ABNORMAL LOW (ref 12.0–15.0)
LYMPHS PCT: 14 %
Lymphs Abs: 1.2 10*3/uL (ref 0.7–4.0)
MCH: 29.1 pg (ref 26.0–34.0)
MCHC: 32.8 g/dL (ref 30.0–36.0)
MCV: 88.7 fL (ref 78.0–100.0)
MONO ABS: 1.1 10*3/uL — AB (ref 0.1–1.0)
MONOS PCT: 12 %
Neutro Abs: 6.2 10*3/uL (ref 1.7–7.7)
Neutrophils Relative %: 72 %
PLATELETS: 184 10*3/uL (ref 150–400)
RBC: 2.92 MIL/uL — ABNORMAL LOW (ref 3.87–5.11)
RDW: 15 % (ref 11.5–15.5)
WBC: 8.7 10*3/uL (ref 4.0–10.5)

## 2018-04-03 LAB — BASIC METABOLIC PANEL
Anion gap: 7 (ref 5–15)
BUN: 28 mg/dL — AB (ref 8–23)
CALCIUM: 8.2 mg/dL — AB (ref 8.9–10.3)
CO2: 23 mmol/L (ref 22–32)
Chloride: 110 mmol/L (ref 98–111)
Creatinine, Ser: 1.33 mg/dL — ABNORMAL HIGH (ref 0.44–1.00)
GFR calc Af Amer: 41 mL/min — ABNORMAL LOW (ref >60)
GFR, EST NON AFRICAN AMERICAN: 35 mL/min — AB (ref >60)
GLUCOSE: 149 mg/dL — AB (ref 70–99)
POTASSIUM: 4.1 mmol/L (ref 3.5–5.1)
Sodium: 140 mmol/L (ref 135–145)

## 2018-04-03 MED ORDER — ALPRAZOLAM 0.25 MG PO TABS: 0.2500 mg | ORAL_TABLET | Freq: Every evening | ORAL | 2 refills | Status: DC | PRN

## 2018-04-03 NOTE — Telephone Encounter (Signed)
[name] dropped off by tammie hopkins for care of mother. Placed in yellow folder.

## 2018-04-03 NOTE — Telephone Encounter (Signed)
RX Fax for [facility]@ [phone]  

## 2018-04-03 NOTE — Telephone Encounter (Signed)
Received [name] forms from patient daughter.   Routed to provider.

## 2018-04-03 NOTE — Progress Notes (Signed)
Location:   [place] Room Number: 103/P Place of Service:  SNF (31) Provider:  Camila Li, MD  Patient Care Team: Alycia Rossetti, MD as PCP - General [facility] Medicine)  Extended Emergency Contact Information Primary Emergency Contact: Hopkins,Tammy Address: [address]          [place], [place] [address] [place] Phone: [phone] Relation: Daughter Secondary Emergency Contact: Nira Retort [place] Mobile Phone: [phone] Relation: Son  Code Status:  Full Code Goals of care: Advanced Directive information Advanced Directives 04/03/2018  Does Patient Have a Medical Advance Directive? Yes  Type of Advance Directive (No Data)  Does patient want to make changes to medical advance directive? No - Patient declined  Would patient like information on creating a medical advance directive? No - Patient declined     Chief Complaint  Patient presents with  . Hospitalization Follow-up    Patient is being seen for Hospitalization F/U   Acute visit status post hospital admission for left femur fracture with repair.   HPI:  Pt is a 84 y.o. female seen today for admission to skilled nursing after hospitalization for left femur fracture sustained after a fall  Patient has a history of dementia as well as chronic kidney disease stage II-type 2 diabetes-hypertension-hyperlipidemia- as well as anxiety.  Patient apparently fell at home and sustained the femur fracture and apparently was on the floor for some time afterwards she was found by family members-she denied hitting her head.  She was found to have a hemoglobin of 8.5 white count was elevated at 16,000-creatinine was 5.36 room systolic to be relatively stable with her baseline-her glucose was 306  Regards to the hip fracture she was seen by orthopedics and they performed a repair on June 26-with a left intramedullary intertrochanteric nail  placement.  She is here for rehab-.  It appears she also received a transfusion secondary to postop anemia.  Hemoglobin today is 8.5 which is down a bit from hospital levels most recently 10.2.  Regards to acute on chronic kidney disease actually creatinine is 1.33 on lab done today BUN of 28 which shows some relative stability  Regards to hypertension she is on Lopressor- her losartan and hydrochlorothiazide were held during hospitalization because of renal issues at this point will monitor blood pressure today is mildly elevated at 152/73 again will monitor if consistently elevated will consider reinitiating this since her kidney function appears to be improving Would like to establish more readings however to establish consistency  Regards to type 2 diabetes she is on glipizide blood sugar this morning was 179 we will continue to monitor.  She also has a history of dementia with anxiety-she is on Aricept and Namenda combination- she does receive Xanax as needed at night.  Apparently last night she did get out of bed and had a fall with no apparent injuries but we will x-ray her hip secondary to recent repair  Currently she has no acute complaints does complain of some hip soreness which apparently has been present since the surgery- she does have a order for Tylenol as needed-she is on aspirin 325 mg a day for DVT prophylaxis.  She does have an indwelling Foley catheter- apparently urinary retention is new status post surgery- per family there was an attempt to remove the catheter but she did retain urine and it was reinserted        Past Medical History:  Diagnosis Date  . Chronic kidney disease    stage II  . Dementia   . Diabetes mellitus   . Hyperlipidemia   . Hypertension    Past Surgical History:  Procedure Laterality Date  . ABDOMINAL HYSTERECTOMY    . BACK SURGERY    . INTRAMEDULLARY (IM) NAIL INTERTROCHANTERIC Left 03/28/2018   Procedure: INTRAMEDULLARY (IM) NAIL  INTERTROCHANTRIC;  Surgeon: Carole Civil, MD;  Location: [facility];  Service: Orthopedics;  Laterality: Left;    No Known Allergies  Allergies as of 04/03/2018   No Known Allergies     Medication List        Accurate as of 04/03/18 12:25 PM. Always use your most recent med list.          acetaminophen 325 MG tablet Commonly known as:  TYLENOL Take 2 tablets (650 mg total) by mouth every 6 (six) hours as needed for mild pain (or Fever >/= 101).   ALPRAZolam 0.25 MG tablet Commonly known as:  XANAX Take 1 tablet (0.25 mg total) by mouth at bedtime as needed.   aspirin 325 MG EC tablet Take 1 tablet (325 mg total) by mouth daily with breakfast.   glipiZIDE 5 MG tablet Commonly known as:  GLUCOTROL take 1 tablet by mouth QD with Breakfast   glucose blood test strip Use as instructed for once daily testing  With Easymax meter   Lancets Misc Use as directed to monitor FSBS 2x daily. Dx: E11.9.   metoprolol tartrate 25 MG tablet Commonly known as:  LOPRESSOR take 1 tablet by mouth twice a day with food for high blood pressure   NAMZARIC 28-10 MG Cp24 Generic drug:  Memantine HCl-Donepezil HCl TAKE 1 CAPSULE BY MOUTH ONCE DAILY(BEGIN AFTER TITRATION PACK IS COMPLETED   phenol 1.4 % Liqd Commonly known as:  CHLORASEPTIC Use as directed 1 spray in the mouth or throat as needed for throat irritation / pain.   rosuvastatin 20 MG tablet Commonly known as:  CRESTOR take 1 tablet by mouth at bedtime FOR CHOLESTEROL       Review of Systems This is limited secondary to dementia provided by family and nursing as well.  In general no complaints of fever chills.  Skin does not complain of rashes or itching per nursing surgical site appears to be stable without sign of infection and is currently covered.  Head ears eyes nose mouth and throat she has prescription lenses is not complaining of any sore throat or visual changes.  Respiratory is not complaining of shortness  of breath or cough.  Cardiac does not complain of chest pain does not appear to have significant lower extremity edema.  GI is not complaining of abdominal discomfort nausea vomiting diarrhea constipation.  GU does have the indwelling Foley catheter with recent urinary retention status post surgery does not really complain of pain.  Musculoskeletal complains of some general hip soreness this does not appear to be acute pain-.  Neurologic does not complain of dizziness headache or numbness.  Psych does not complain of being depressed but does have a history of anxiety as noted above-especially more so at night it appears per family.     Immunization History  Administered Date(s) Administered  . Influenza Split 06/21/2012  . Influenza, High Dose Seasonal PF 07/11/2017  . Influenza,inj,Quad PF,6+ Mos 07/02/2013, 07/30/2014, 09/04/2015, 08/05/2016  . Influenza-Unspecified 08/02/2001, 07/17/2002, 07/27/2006  . Pneumococcal Conjugate-13 11/01/2013  . Pneumococcal Polysaccharide-23 03/29/2012  . Td 03/14/2000  . Zoster 09/20/2012  Pertinent  Health Maintenance Due  Topic Date Due  . OPHTHALMOLOGY EXAM  05/04/2018 (Originally 01/14/2016)  . URINE MICROALBUMIN  05/04/2018 (Originally 09/03/2016)  . DEXA SCAN  05/04/2018 (Originally 10/24/1997)  . HEMOGLOBIN A1C  04/26/2018  . INFLUENZA VACCINE  05/03/2018  . FOOT EXAM  10/27/2018  . PNA vac Low Risk Adult  Completed   Fall Risk  11/28/2017 10/27/2017 04/28/2017 12/05/2016 09/04/2015  Falls in the past year? No No No No No   Functional Status Survey:    Vitals:   04/03/18 1204  BP: (!) 152/73  Pulse: 86  Resp: 19  Temp: 97.6 F (36.4 C)  TempSrc: Oral  SpO2: 99%    Physical Exam   In general this is a pleasant fairly well-nourished elderly female in no distress.  Her skin is warm and dry there is covering over the left hip surgical site per nursing this looks stable with no sign of infection no concerning erythema or  drainage  Eyes visual acuity appears to be intact she has prescription lenses sclera and conjunctive are clear.  Oropharynx is clear mucous membranes moist.  Chest is clear to auscultation there is no labored breathing.  Heart is regular rate and rhythm without murmur gallop or rub she does not have significant lower extremity edema pedal pulses are palpable bilaterally  Abdomen is soft nontender with positive bowel sounds.  GU she does have a Foley catheter draining amber-colored urine  Rectal I did digitally test for occult positive blood it was negative  Musculoskeletal Limited strength of her left leg status post surgery appears able to move her other extremities at baseline although limited exam since she is in bed  Neurologic is grossly intact her speech is clear no lateralizing findings.  Cranial nerves appear to be intact  Psych she is oriented to self is able to recall her church and her street address- somewhat confused to month saying it was June but then was able to recall July- could not really identify president saying that she does not keep track of those things  Labs reviewed: Recent Labs    03/29/18 0453 03/30/18 0423 04/03/18 0400  NA 139 140 140  K 4.1 4.3 4.1  CL 104 107 110  CO2 27 27 23   GLUCOSE 105* 87 149*  BUN 28* 24* 28*  CREATININE 1.28* 1.27* 1.33*  CALCIUM 7.8* 8.2* 8.2*   Recent Labs    04/28/17 0852 10/27/17 0828  AST 15 15  ALT 12 8  ALKPHOS 52  --   BILITOT 0.4 0.3  PROT 6.5 6.4  ALBUMIN 3.9  --    Recent Labs    10/27/17 0828 03/26/18 2059  03/30/18 0423 03/31/18 0450 04/03/18 0400  WBC 6.0 16.0*   < > 11.4* 13.3* 8.7  NEUTROABS 3,642 14.7  --   --   --  6.2  HGB 10.0* 8.5*   < > 10.7* 10.2* 8.5*  HCT 31.3* 26.4*   < > 32.8* 31.3* 25.9*  MCV 83.5 84.6   < > 88.9 89.2 88.7  PLT 209 155   < > 121* 145* 184   < > = values in this interval not displayed.   Lab Results  Component Value Date   TSH 0.423 01/11/2016   Lab  Results  Component Value Date   HGBA1C 7.9 (H) 10/27/2017   Lab Results  Component Value Date   CHOL 200 (H) 10/27/2017   HDL 64 10/27/2017   LDLCALC 112 (H) 10/27/2017  TRIG 125 10/27/2017   CHOLHDL 3.1 10/27/2017    Significant Diagnostic Results in last 30 days:  No results found.  Assessment/Plan  #1 history of left femur fracture with repair- she will need followed by orthopedics- at this point only has Tylenol for pain this will have to be watched.  She continues on aspirin 325 mg a day x28 days for DVT prophylaxis.  At this point is having some soreness-will update an x-ray especially in light of recent fall that apparently had no apparent injuries overnight but would like to get an updated x-ray.  2.-  History of chronic kidney disease creatinine appears relatively stable at 1.33- currently her losartan and diuretic were held because of renal issues at this point will monitor.  Will update a metabolic panel later this week.  3.  Anemia-appears her initial hemoglobin was 8.5 after surgery apparently received a transfusion hemoglobin is now 8.5 today- stool testing for occult blood was negative today- we will start her on iron-and update this later this week-continue to test stools x3 for blood  .  4.  History of type 2 diabetes she is on glipizide 5 mg a day blood sugar this morning 179 we will continue to monitor blood sugars for now before making any changes.  5.-  History of hypertension-again systolic is somewhat elevated today she is on metoprolol twice a day-losartan and hydrochlorothiazide were held because of renal issues at this point since we have minimal readings will monitor.  6.  History of dementia continues on Namzaric which is a combination of Namenda and Aricept- at this point will monitor I suspect when she goes home she will need 24-hour care.  7.-  History of anxiety-she is on Xanax as needed at night-apparently this has been quite effective at home  per family- anxiety was an issue in the hospital and apparently got better the longer she stayed there.  Last night apparently was a tough night with persistent anxiety and she did have a fall- I did discuss this with family-at this point will continue the Xanax would be hesitant to start a new medication without knowing patient a bit better-and concerned that new medication would over sedate or possibly lead to increased falls--family did express understanding-she will need the bed in low position and mats placed as well to lessen any possibility of injury-this was discussed with nursing and at this point will monitor- per family-- patient's  anxiety improves when she gains familiarity with her surroundings  #8-history of urinary retention apparently this is a new diagnosis since the surgery- she apparently has failed one trial- at this point will continue catheter short-term but suspect we will try to wean her off this again in short order will await Dr. Clayborn Heron assessment tomorrow--  [phone] note greater than 40 minutes spent assessing patient- reviewing her chart and labs- and coordinating and formulating a plan of care for numerous diagnoses.  Of note greater than 50% of time spent coordinating a plan of care

## 2018-04-04 ENCOUNTER — Encounter: Payer: Self-pay | Admitting: Internal Medicine

## 2018-04-04 ENCOUNTER — Non-Acute Institutional Stay (SKILLED_NURSING_FACILITY): Payer: Medicare HMO | Admitting: Internal Medicine

## 2018-04-04 DIAGNOSIS — E1122 Type 2 diabetes mellitus with diabetic chronic kidney disease: Secondary | ICD-10-CM | POA: Diagnosis not present

## 2018-04-04 DIAGNOSIS — R69 Illness, unspecified: Secondary | ICD-10-CM | POA: Diagnosis not present

## 2018-04-04 DIAGNOSIS — F01518 Vascular dementia, unspecified severity, with other behavioral disturbance: Secondary | ICD-10-CM

## 2018-04-04 DIAGNOSIS — Z8781 Personal history of (healed) traumatic fracture: Secondary | ICD-10-CM | POA: Insufficient documentation

## 2018-04-04 DIAGNOSIS — Z967 Presence of other bone and tendon implants: Secondary | ICD-10-CM | POA: Diagnosis not present

## 2018-04-04 DIAGNOSIS — Z9889 Other specified postprocedural states: Secondary | ICD-10-CM | POA: Insufficient documentation

## 2018-04-04 DIAGNOSIS — F0151 Vascular dementia with behavioral disturbance: Secondary | ICD-10-CM

## 2018-04-04 NOTE — Assessment & Plan Note (Signed)
Trial of BuSpar 5 mg twice a day

## 2018-04-04 NOTE — Progress Notes (Signed)
Provider:Darenda Fike   Location:   [place] Room Number: 103/P Place of Service:  SNF (31)  PCP: Alycia Rossetti, MD Patient Care Team: Alycia Rossetti, MD as PCP - General [facility] Medicine)  Extended Emergency Contact Information Primary Emergency Contact: Hopkins,Tammy Address: [address]          [place], [place] [address] [place] Phone: [phone] Relation: Daughter Secondary Emergency Contact: Nira Retort [place] Mobile Phone: [phone] Relation: Son  Code Status: Full Code Goals of Care: Advanced Directive information Advanced Directives 04/04/2018  Does Patient Have a Medical Advance Directive? Yes  Type of Advance Directive (No Data)  Does patient want to make changes to medical advance directive? No - Patient declined  Would patient like information on creating a medical advance directive? No - Patient declined      Chief Complaint  Patient presents with  . New Admit To SNF    New Admission Visit    This is a comprehensive admission note to [facility] performed on this date less than 30 days from date of admission. Included are preadmission medical/surgical history; reconciled medication list; family history; social history and comprehensive review of systems.  Corrections and additions to the records were documented. Comprehensive physical exam was also performed. Additionally a clinical summary was entered for each active diagnosis pertinent to this admission in the Problem List to enhance continuity of care.  HPI: Patient was admitted to SNF for rehabilitation following hospitalization 6/24-04/02/18  for left femur fracture sustained in a fall. Surgical repair was completed 6/26 with a left intramedullary intertrochanteric nail placement. Fall was not visualized and she was on the ground for an undetermined period of time. She had marked leukocytosis with a white count 16,000  and anemia with hemoglobin 8.5. Creatinine of 1.69 was felt to be baseline .Glucose was over 300. Course was complicated by urinary retention for which Foley was placed. Apparently she failed trial of Foley removal and it was reinserted. 325 mg of aspirin was continued for DVT prophylaxis. Anemia progressed postoperatively and she received transfusion of 1 unit of packed cells. Hemoglobin was 10.2 following transfusion. In SNF she did sustain a fall the evening of 7/1 with no apparent injuries clinically. A follow-up film was performed and revealed no fracture displacement.  She also pulled her Foley out twice. Her dementia is mainly manifested as sundowning. In the facility on glipizide glucoses have ranged from 179-316.    Review of systems cannot be completed due to the dementia. She was unable to give me the year or month. She affirms she is here because she has "the flu". She states she has a headache and soreness in her side and back. She maintains that she needs to "be stretched out". She has no comprehension that she fractured her hip.   Physical exam:  Pertinent or positive findings: She appears suboptimally nourished. Arcus senilis is present. She has multiple missing teeth. Foley catheter is in place. Pulses are decreased. She has trace left lower extremity edema.She can not follow commands.  General appearance:no acute distress, increased work of breathing is present.   Lymphatic: No lymphadenopathy about the head, neck, axilla. Eyes: No conjunctival inflammation or lid edema is present. There is no scleral icterus. Ears:  External ear exam shows no significant lesions or deformities.   Nose:  External nasal examination shows no deformity or inflammation. Nasal mucosa are pink and moist without lesions, exudates Oral  exam: Lips and gums are healthy appearing.There is no oropharyngeal erythema or exudate. Neck:  No thyromegaly, masses, tenderness noted.    Heart:  Normal rate and  regular rhythm. S1 and S2 normal without gallop, murmur, click, rub.  Lungs: Chest clear to auscultation without wheezes, rhonchi, rales, rubs. Abdomen: Bowel sounds are normal.  Abdomen is soft and nontender with no organomegaly, hernias, masses. GU: Deferred  Extremities:  No cyanosis, clubbing Neurologic exam:  Balance, Rhomberg, finger to nose testing could not be completed due to clinical state Skin: Warm & dry w/o tenting. No significant lesions or rash.  See clinical summary under each active problem in the Problem List with associated updated therapeutic plan   Past Medical History:  Diagnosis Date  . Chronic kidney disease    stage II  . Dementia   . Diabetes mellitus   . Hyperlipidemia   . Hypertension    Past Surgical History:  Procedure Laterality Date  . ABDOMINAL HYSTERECTOMY    . BACK SURGERY    . INTRAMEDULLARY (IM) NAIL INTERTROCHANTERIC Left 03/28/2018   Procedure: INTRAMEDULLARY (IM) NAIL INTERTROCHANTRIC;  Surgeon: Carole Civil, MD;  Location: [facility];  Service: Orthopedics;  Laterality: Left;    reports that she has never smoked. She has never used smokeless tobacco. She reports that she does not drink alcohol or use drugs. Social History   Socioeconomic History  . Marital status: Widowed    Spouse name: Not on file  . Number of children: Not on file  . Years of education: Not on file  . Highest education level: Not on file  Occupational History  . Not on file  Social Needs  . Financial resource strain: Not on file  . Food insecurity:    Worry: Not on file    Inability: Not on file  . Transportation needs:    Medical: Not on file    Non-medical: Not on file  Tobacco Use  . Smoking status: Never Smoker  . Smokeless tobacco: Never Used  Substance and Sexual Activity  . Alcohol use: No  . Drug use: No  . Sexual activity: Not Currently  Lifestyle  . Physical activity:    Days per week: Not on file    Minutes per session: Not on file  .  Stress: Not on file  Relationships  . Social connections:    Talks on phone: Not on file    Gets together: Not on file    Attends religious service: Not on file    Active member of club or organization: Not on file    Attends meetings of clubs or organizations: Not on file    Relationship status: Not on file  . Intimate partner violence:    Fear of current or ex partner: Not on file    Emotionally abused: Not on file    Physically abused: Not on file    Forced sexual activity: Not on file  Other Topics Concern  . Not on file  Social History Narrative  . Not on file    Functional Status Survey:    Family History  Problem Relation Age of Onset  . Hypertension Mother   . Cancer Father        colon   . Cancer Sister        Breast cancer  . Osteoporosis Sister     Health Maintenance  Topic Date Due  . OPHTHALMOLOGY EXAM  05/04/2018 (Originally 01/14/2016)  . URINE MICROALBUMIN  05/04/2018 (Originally 09/03/2016)  .  DEXA SCAN  05/04/2018 (Originally 10/24/1997)  . TETANUS/TDAP  05/04/2018 (Originally 03/14/2010)  . HEMOGLOBIN A1C  04/26/2018  . INFLUENZA VACCINE  05/03/2018  . FOOT EXAM  10/27/2018  . PNA vac Low Risk Adult  Completed    No Known Allergies  Outpatient Encounter Medications as of 04/04/2018  Medication Sig  . acetaminophen (TYLENOL) 325 MG tablet Take 2 tablets (650 mg total) by mouth every 6 (six) hours as needed for mild pain (or Fever >/= 101).  Marland Kitchen ALPRAZolam (XANAX) 0.25 MG tablet Take 1 tablet (0.25 mg total) by mouth at bedtime as needed.  Marland Kitchen aspirin EC 325 MG EC tablet Take 1 tablet (325 mg total) by mouth daily with breakfast.  . glipiZIDE (GLUCOTROL) 5 MG tablet take 1 tablet by mouth QD with Breakfast  . glucose blood test strip Use as instructed for once daily testing  With Easymax meter  . Lancets MISC Use as directed to monitor FSBS 2x daily. Dx: E11.9.  . metoprolol tartrate (LOPRESSOR) 25 MG tablet take 1 tablet by mouth twice a day with food  for high blood pressure  . NAMZARIC 28-10 MG CP24 TAKE 1 CAPSULE BY MOUTH ONCE DAILY(BEGIN AFTER TITRATION PACK IS COMPLETED  . phenol (CHLORASEPTIC) 1.4 % LIQD Use as directed 1 spray in the mouth or throat as needed for throat irritation / pain.  . rosuvastatin (CRESTOR) 20 MG tablet take 1 tablet by mouth at bedtime FOR CHOLESTEROL   No facility-administered encounter medications on file as of 04/04/2018.     Vitals:   04/04/18 1041  BP: (!) 148/72  Pulse: 94  Resp: 18  Temp: 98.5 F (36.9 C)  TempSrc: Oral  SpO2: 99%   There is no height or weight on file to calculate BMI.  Labs reviewed: Basic Metabolic Panel: Recent Labs    03/29/18 0453 03/30/18 0423 04/03/18 0400  NA 139 140 140  K 4.1 4.3 4.1  CL 104 107 110  CO2 27 27 23   GLUCOSE 105* 87 149*  BUN 28* 24* 28*  CREATININE 1.28* 1.27* 1.33*  CALCIUM 7.8* 8.2* 8.2*   Liver Function Tests: Recent Labs    04/28/17 0852 10/27/17 0828  AST 15 15  ALT 12 8  ALKPHOS 52  --   BILITOT 0.4 0.3  PROT 6.5 6.4  ALBUMIN 3.9  --    No results for input(s): LIPASE, AMYLASE in the last 8760 hours. No results for input(s): AMMONIA in the last 8760 hours. CBC: Recent Labs    10/27/17 0828 03/26/18 2059  03/30/18 0423 03/31/18 0450 04/03/18 0400  WBC 6.0 16.0*   < > 11.4* 13.3* 8.7  NEUTROABS 3,642 14.7  --   --   --  6.2  HGB 10.0* 8.5*   < > 10.7* 10.2* 8.5*  HCT 31.3* 26.4*   < > 32.8* 31.3* 25.9*  MCV 83.5 84.6   < > 88.9 89.2 88.7  PLT 209 155   < > 121* 145* 184   < > = values in this interval not displayed.   Cardiac Enzymes: Recent Labs    03/26/18 2059 03/30/18 0423  CKTOTAL 274* 602*   BNP: Invalid input(s): POCBNP Lab Results  Component Value Date   HGBA1C 7.9 (H) 10/27/2017   Lab Results  Component Value Date   TSH 0.423 01/11/2016   Lab Results  Component Value Date   [id number] 762 06/21/2012   No results found for: FOLATE Lab Results  Component Value Date   IRON  46 12/01/2010     TIBC 265 12/01/2010

## 2018-04-04 NOTE — Assessment & Plan Note (Signed)
PT/OT at [facility] follow-up with Dr. [name] as scheduled

## 2018-04-04 NOTE — Assessment & Plan Note (Signed)
Poor control on the oral sulfonylurea with glucoses over 300 DC glipizide and initiate low-dose basal insulin with before meal coverage for breakfast and lunch if glucose greater than 150

## 2018-04-04 NOTE — Telephone Encounter (Signed)
Received completed [name] forms from provider.   No charge per provider. Patient contacted to pick up forms. Call placed to patient. [place].

## 2018-04-04 NOTE — Progress Notes (Signed)
This encounter was created in error - please disregard.

## 2018-04-04 NOTE — Patient Instructions (Signed)
See assessment and plan under each diagnosis in the problem list and acutely for this visit 

## 2018-04-06 ENCOUNTER — Other Ambulatory Visit (HOSPITAL_COMMUNITY)
Admission: RE | Admit: 2018-04-06 | Discharge: 2018-04-06 | Disposition: A | Discharge status: Home / Self Care | Payer: Medicare HMO | Source: Skilled Nursing Facility | Attending: Internal Medicine | Admitting: Internal Medicine

## 2018-04-06 ENCOUNTER — Encounter: Payer: Self-pay | Admitting: Internal Medicine

## 2018-04-06 DIAGNOSIS — E1169 Type 2 diabetes mellitus with other specified complication: Secondary | ICD-10-CM | POA: Insufficient documentation

## 2018-04-06 DIAGNOSIS — Z4789 Encounter for other orthopedic aftercare: Secondary | ICD-10-CM | POA: Insufficient documentation

## 2018-04-06 LAB — CBC WITH DIFFERENTIAL/PLATELET
Basophils Absolute: 0 10*3/uL (ref 0.0–0.1)
Basophils Relative: 0 %
Eosinophils Absolute: 0.1 10*3/uL (ref 0.0–0.7)
Eosinophils Relative: 1 %
HCT: 27.7 % — ABNORMAL LOW (ref 36.0–46.0)
Hemoglobin: 9 g/dL — ABNORMAL LOW (ref 12.0–15.0)
Lymphocytes Relative: 17 %
Lymphs Abs: 1.7 10*3/uL (ref 0.7–4.0)
MCH: 29 pg (ref 26.0–34.0)
MCHC: 32.5 g/dL (ref 30.0–36.0)
MCV: 89.4 fL (ref 78.0–100.0)
Monocytes Absolute: 1.2 10*3/uL — ABNORMAL HIGH (ref 0.1–1.0)
Monocytes Relative: 12 %
Neutro Abs: 7 10*3/uL (ref 1.7–7.7)
Neutrophils Relative %: 70 %
Platelets: 291 10*3/uL (ref 150–400)
RBC: 3.1 MIL/uL — ABNORMAL LOW (ref 3.87–5.11)
RDW: 15.3 % (ref 11.5–15.5)
WBC: 10 10*3/uL (ref 4.0–10.5)

## 2018-04-06 LAB — BASIC METABOLIC PANEL
Anion gap: 10 (ref 5–15)
BUN: 32 mg/dL — ABNORMAL HIGH (ref 8–23)
CO2: 26 mmol/L (ref 22–32)
Calcium: 8.3 mg/dL — ABNORMAL LOW (ref 8.9–10.3)
Chloride: 104 mmol/L (ref 98–111)
Creatinine, Ser: 1.23 mg/dL — ABNORMAL HIGH (ref 0.44–1.00)
GFR calc Af Amer: 45 mL/min — ABNORMAL LOW (ref >60)
GFR calc non Af Amer: 39 mL/min — ABNORMAL LOW (ref >60)
Glucose, Bld: 228 mg/dL — ABNORMAL HIGH (ref 70–99)
Potassium: 4.2 mmol/L (ref 3.5–5.1)
Sodium: 140 mmol/L (ref 135–145)

## 2018-04-06 LAB — OCCULT BLOOD X 1 CARD TO LAB, STOOL: Fecal Occult Bld: NEGATIVE

## 2018-04-07 ENCOUNTER — Encounter (HOSPITAL_COMMUNITY)
Admission: RE | Admit: 2018-04-07 | Discharge: 2018-04-07 | Disposition: A | Discharge status: Home / Self Care | Payer: Medicare HMO | Source: Skilled Nursing Facility | Attending: *Deleted | Admitting: *Deleted

## 2018-04-07 LAB — OCCULT BLOOD X 1 CARD TO LAB, STOOL: Fecal Occult Bld: NEGATIVE

## 2018-04-10 ENCOUNTER — Encounter: Payer: Self-pay | Admitting: Internal Medicine

## 2018-04-10 ENCOUNTER — Ambulatory Visit (INDEPENDENT_AMBULATORY_CARE_PROVIDER_SITE_OTHER): Payer: Self-pay | Admitting: Orthopedic Surgery

## 2018-04-10 ENCOUNTER — Inpatient Hospital Stay (INDEPENDENT_AMBULATORY_CARE_PROVIDER_SITE_OTHER): Payer: Medicare HMO

## 2018-04-10 ENCOUNTER — Telehealth: Payer: Self-pay | Admitting: Radiology

## 2018-04-10 VITALS — BP 165/77 | HR 73 | Ht 61.0 in | Wt 124.0 lb

## 2018-04-10 DIAGNOSIS — S7222XD Displaced subtrochanteric fracture of left femur, subsequent encounter for closed fracture with routine healing: Secondary | ICD-10-CM

## 2018-04-10 DIAGNOSIS — Z9889 Other specified postprocedural states: Secondary | ICD-10-CM

## 2018-04-10 DIAGNOSIS — Z967 Presence of other bone and tendon implants: Secondary | ICD-10-CM

## 2018-04-10 DIAGNOSIS — Z8781 Personal history of (healed) traumatic fracture: Secondary | ICD-10-CM

## 2018-04-10 DIAGNOSIS — S72142D Displaced intertrochanteric fracture of left femur, subsequent encounter for closed fracture with routine healing: Secondary | ICD-10-CM

## 2018-04-10 DIAGNOSIS — S72141D Displaced intertrochanteric fracture of right femur, subsequent encounter for closed fracture with routine healing: Secondary | ICD-10-CM

## 2018-04-10 NOTE — Telephone Encounter (Signed)
Patients staples were not removed today in the office, [facility] called for Verbal order to remove staples

## 2018-04-10 NOTE — Progress Notes (Addendum)
Chief Complaint  Patient presents with  . Follow-up    Recheck on left hip fracture, DOS 03-28-18.    First postop visit postop day 13  X-ray today shows good fracture impaction nail is in good position  Patient has good leg lengths normal rotation she is amatory with a walker  She still has a Foley bag due to urinary retention.  I  asked the nursing home to either remove it and send her for urology consult for further management  X-ray in 6 weeks  Implants 125 degree short gamma nail 100 mm lag screw 35 mm locking screw proximal acorn and sliding mode  PRE-OPERATIVE DIAGNOSIS:  left hip intertrochanteric fracture   POST-OPERATIVE DIAGNOSIS:  left hip intertrochanteric fracture   PROCEDURE:  Procedure(s): INTRAMEDULLARY (IM) NAIL INTERTROCHANTRIC ([id number]   The patient was identified in preop surgical site was confirmed marked chart was reviewed patient was taken to surgery.  2 g Ancef started IV.  Patient had general anesthesia.

## 2018-04-10 NOTE — Progress Notes (Signed)
Location:    [place] Room Number: 103/P Place of Service:  SNF (31) Provider:  Veleta Miners MD  Alycia Rossetti, MD  Patient Care Team: Alycia Rossetti, MD as PCP - General [facility])  Extended Emergency Contact Information Primary Emergency Contact: Hopkins,Tammy Address: [address]          [place], [place] [address] [place] Phone: [phone] Relation: Daughter Secondary Emergency Contact: Nira Retort [place] Mobile Phone: [phone] Relation: Son  Code Status:  Full Code Goals of care: Advanced Directive information Advanced Directives 04/10/2018  Does Patient Have a Medical Advance Directive? Yes  Type of Advance Directive (No Data)  Does patient want to make changes to medical advance directive? No - Patient declined  Would patient like information on creating a medical advance directive? No - Patient declined     Chief Complaint  Patient presents with  . Acute Visit    F/U Visit    HPI:  Pt is a 84 y.o. female seen today for an acute visit for    Past Medical History:  Diagnosis Date  . Chronic kidney disease    stage II  . Dementia   . Diabetes mellitus   . Hyperlipidemia   . Hypertension    Past Surgical History:  Procedure Laterality Date  . ABDOMINAL HYSTERECTOMY    . BACK SURGERY    . INTRAMEDULLARY (IM) NAIL INTERTROCHANTERIC Left 03/28/2018   Procedure: INTRAMEDULLARY (IM) NAIL INTERTROCHANTRIC;  Surgeon: Carole Civil, MD;  Location: [facility];  Service: Orthopedics;  Laterality: Left;    No Known Allergies  Outpatient Encounter Medications as of 04/10/2018  Medication Sig  . acetaminophen (TYLENOL) 325 MG tablet Take 2 tablets (650 mg total) by mouth every 6 (six) hours as needed for mild pain (or Fever >/= 101).  Marland Kitchen ALPRAZolam (XANAX) 0.25 MG tablet Take 1 tablet (0.25 mg total) by mouth at bedtime as needed.  Marland Kitchen aspirin EC 325 MG EC tablet Take 1 tablet  (325 mg total) by mouth daily with breakfast.  . busPIRone (BUSPAR) 5 MG tablet Take 5 mg by mouth 2 (two) times daily.  . ferrous sulfate (QC FERROUS SULFATE) 325 (65 FE) MG tablet Take 325 mg by mouth daily with breakfast.  . glucose blood test strip Use as instructed for once daily testing  With Easymax meter  . Insulin Glargine (LANTUS SOLOSTAR) 100 UNIT/ML Solostar Pen Inject 12 Units into the skin at bedtime.  . insulin lispro (HUMALOG KWIKPEN) 100 UNIT/ML KiwkPen Inject 2 Units into the skin daily.  . Lancets MISC Use as directed to monitor FSBS 2x daily. Dx: E11.9.  . metoprolol tartrate (LOPRESSOR) 25 MG tablet take 1 tablet by mouth twice a day with food for high blood pressure  . NAMZARIC 28-10 MG CP24 TAKE 1 CAPSULE BY MOUTH ONCE DAILY(BEGIN AFTER TITRATION PACK IS COMPLETED  . phenol (CHLORASEPTIC) 1.4 % LIQD Use as directed 1 spray in the mouth or throat as needed for throat irritation / pain.  . rosuvastatin (CRESTOR) 20 MG tablet take 1 tablet by mouth at bedtime FOR CHOLESTEROL  . [DISCONTINUED] glipiZIDE (GLUCOTROL) 5 MG tablet take 1 tablet by mouth QD with Breakfast   No facility-administered encounter medications on file as of 04/10/2018.      Review of Systems  Immunization History  Administered Date(s) Administered  . Influenza Split 06/21/2012  . Influenza, High Dose Seasonal PF 07/11/2017  . Influenza,inj,Quad PF,6+ Mos  07/02/2013, 07/30/2014, 09/04/2015, 08/05/2016  . Influenza-Unspecified 08/02/2001, 07/17/2002, 07/27/2006  . Pneumococcal Conjugate-13 11/01/2013  . Pneumococcal Polysaccharide-23 03/29/2012  . Td 03/14/2000  . Zoster 09/20/2012   Pertinent  Health Maintenance Due  Topic Date Due  . OPHTHALMOLOGY EXAM  05/04/2018 (Originally 01/14/2016)  . URINE MICROALBUMIN  05/04/2018 (Originally 09/03/2016)  . DEXA SCAN  05/04/2018 (Originally 10/24/1997)  . HEMOGLOBIN A1C  04/26/2018  . INFLUENZA VACCINE  05/03/2018  . FOOT EXAM  10/27/2018  . PNA vac  Low Risk Adult  Completed   Fall Risk  11/28/2017 10/27/2017 04/28/2017 12/05/2016 09/04/2015  Falls in the past year? No No No No No   Functional Status Survey:    Vitals:   04/10/18 0900  BP: 138/70  Pulse: 75  Resp: 20  Temp: 98 F (36.7 C)  TempSrc: Oral  SpO2: 96%   There is no height or weight on file to calculate BMI. Physical Exam  Labs reviewed: Recent Labs    03/30/18 0423 04/03/18 0400 04/06/18 0854  NA 140 140 140  K 4.3 4.1 4.2  CL 107 110 104  CO2 27 23 26   GLUCOSE 87 149* 228*  BUN 24* 28* 32*  CREATININE 1.27* 1.33* 1.23*  CALCIUM 8.2* 8.2* 8.3*   Recent Labs    04/28/17 0852 10/27/17 0828  AST 15 15  ALT 12 8  ALKPHOS 52  --   BILITOT 0.4 0.3  PROT 6.5 6.4  ALBUMIN 3.9  --    Recent Labs    03/26/18 2059  03/31/18 0450 04/03/18 0400 04/06/18 0854  WBC 16.0*   < > 13.3* 8.7 10.0  NEUTROABS 14.7  --   --  6.2 7.0  HGB 8.5*   < > 10.2* 8.5* 9.0*  HCT 26.4*   < > 31.3* 25.9* 27.7*  MCV 84.6   < > 89.2 88.7 89.4  PLT 155   < > 145* 184 291   < > = values in this interval not displayed.   Lab Results  Component Value Date   TSH 0.423 01/11/2016   Lab Results  Component Value Date   HGBA1C 7.9 (H) 10/27/2017   Lab Results  Component Value Date   CHOL 200 (H) 10/27/2017   HDL 64 10/27/2017   LDLCALC 112 (H) 10/27/2017   TRIG 125 10/27/2017   CHOLHDL 3.1 10/27/2017    Significant Diagnostic Results in last 30 days:  Dg Chest 1 View  Result Date: 03/26/2018 CLINICAL DATA:  Unwitnessed fall at home.  Preop left hip fracture. EXAM: CHEST  1 VIEW COMPARISON:  01/11/2016 FINDINGS: Heart is top-normal in size. Moderate aortic atherosclerosis with ectasia. Clear lungs. No acute osseous abnormality. IMPRESSION: Aortic atherosclerosis.  No active pulmonary disease. Electronically Signed   By: Ashley Royalty M.D.   On: 03/26/2018 22:16   Dg Hip Operative Unilat W Or W/o Pelvis Left  Result Date: 03/28/2018 CLINICAL DATA:  Left hip fracture.  EXAM: OPERATIVE left HIP (WITH PELVIS IF PERFORMED) 8 VIEWS TECHNIQUE: Fluoroscopic spot image(s) were submitted for interpretation post-operatively. Radiation exposure index: 21.9 mGy. COMPARISON:  Radiographs of March 26, 2018. FINDINGS: Eight fluoroscopic images of the left hip demonstrate the patient be status post surgical internal fixation of intertrochanteric fracture of proximal left femur. IMPRESSION: Status post surgical internal fixation of proximal left femoral intertrochanteric fracture. Electronically Signed   By: Marijo Conception, M.D.   On: 03/28/2018 14:45   Dg Hip Unilat With Pelvis 2-3 Views Left  Result Date: 03/26/2018  CLINICAL DATA:  Unwitnessed fall at home today.  Pain. EXAM: DG HIP (WITH OR WITHOUT PELVIS) 2-3V LEFT COMPARISON:  None. FINDINGS: Acute, varus angulated intertrochanteric fracture of the left femur with avulsed lesser trochanter. Status post L4-5 lumbar fusion with hardware in place. No acute pelvic fracture. The native right hip appears intact. IMPRESSION: Acute, varus angulated intertrochanteric fracture of the left femur with avulsed lesser trochanter. Electronically Signed   By: Ashley Royalty M.D.   On: 03/26/2018 22:08    Assessment/Plan There are no diagnoses linked to this encounter.   Family/ staff Communication:   Labs/tests ordered:    This encounter was created in error - please disregard.

## 2018-04-11 ENCOUNTER — Encounter: Payer: Self-pay | Admitting: Internal Medicine

## 2018-04-11 ENCOUNTER — Other Ambulatory Visit (HOSPITAL_COMMUNITY)
Admission: RE | Admit: 2018-04-11 | Discharge: 2018-04-11 | Disposition: A | Discharge status: Home / Self Care | Payer: Medicare HMO | Source: Skilled Nursing Facility | Attending: *Deleted | Admitting: *Deleted

## 2018-04-11 ENCOUNTER — Non-Acute Institutional Stay (SKILLED_NURSING_FACILITY): Payer: Medicare HMO | Admitting: Internal Medicine

## 2018-04-11 ENCOUNTER — Ambulatory Visit (HOSPITAL_COMMUNITY)
Admission: RE | Admit: 2018-04-11 | Discharge: 2018-04-11 | Disposition: A | Discharge status: Home / Self Care | Payer: Medicare HMO | Source: Skilled Nursing Facility | Attending: Internal Medicine | Admitting: Internal Medicine

## 2018-04-11 ENCOUNTER — Inpatient Hospital Stay (HOSPITAL_COMMUNITY): Payer: Medicare HMO | Attending: Internal Medicine

## 2018-04-11 DIAGNOSIS — M1712 Unilateral primary osteoarthritis, left knee: Secondary | ICD-10-CM | POA: Diagnosis not present

## 2018-04-11 DIAGNOSIS — S72145D Nondisplaced intertrochanteric fracture of left femur, subsequent encounter for closed fracture with routine healing: Secondary | ICD-10-CM

## 2018-04-11 DIAGNOSIS — R600 Localized edema: Secondary | ICD-10-CM | POA: Diagnosis not present

## 2018-04-11 DIAGNOSIS — E1122 Type 2 diabetes mellitus with diabetic chronic kidney disease: Secondary | ICD-10-CM | POA: Diagnosis not present

## 2018-04-11 DIAGNOSIS — I10 Essential (primary) hypertension: Secondary | ICD-10-CM

## 2018-04-11 DIAGNOSIS — Z4789 Encounter for other orthopedic aftercare: Secondary | ICD-10-CM | POA: Diagnosis not present

## 2018-04-11 DIAGNOSIS — Z872 Personal history of diseases of the skin and subcutaneous tissue: Secondary | ICD-10-CM | POA: Diagnosis not present

## 2018-04-11 DIAGNOSIS — M7989 Other specified soft tissue disorders: Secondary | ICD-10-CM | POA: Diagnosis not present

## 2018-04-11 DIAGNOSIS — M25552 Pain in left hip: Secondary | ICD-10-CM | POA: Diagnosis not present

## 2018-04-11 DIAGNOSIS — M79605 Pain in left leg: Secondary | ICD-10-CM | POA: Diagnosis not present

## 2018-04-11 LAB — OCCULT BLOOD X 1 CARD TO LAB, STOOL: Fecal Occult Bld: NEGATIVE

## 2018-04-11 NOTE — Progress Notes (Signed)
Location:   [place] Room Number: 103/P Place of Service:  SNF (31) Provider:  Camila Li, MD  Patient Care Team: Alycia Rossetti, MD as PCP - General [facility])  Extended Emergency Contact Information Primary Emergency Contact: Hopkins,Tammie Address: [address]          [place], [place] [address] [place] Phone: [phone] Relation: Daughter Secondary Emergency Contact: Nira Retort [place] Mobile Phone: [phone] Relation: Son  Code Status:  Full Code Goals of care: Advanced Directive information Advanced Directives 04/11/2018  Does Patient Have a Medical Advance Directive? Yes  Type of Advance Directive (No Data)  Does patient want to make changes to medical advance directive? No - Patient declined  Would patient like information on creating a medical advance directive? No - Patient declined     Chief Complaint  Patient presents with  . Acute Visit    Patient is being seen for Swollen Left Ankle    HPI:  Pt is a 84 y.o. female seen today for an acute visit for concerns about some swelling of her left ankle    She does have a history of a left femur fracture repaired- postop course has been fairly unremarkable.  She also has a history of dementia as well as chronic kidney disease type 2 diabetes hypertension hyperlipidemia and anxiety  Apparently nursing has noted some swelling of the  Left ankle area --she has had some postop edema but there is some nursing concerned that this possibly increased although somewhat unclear the extent of any additional edema.  She is not complaining of any pain and actually saw her orthopedic surgeon yesterday and thought to be doing well staples were removed  Vital signs are stable her blood pressure is somewhat elevated at times she can be anxious will monitor systolics have been in the 150s per auscultation and manual reading today    She is on Lopressor twice daily.  In regards to type 2 diabetes she is on Lantus 12 units nightly in addition to Humalog 2 units at lunch  if CBG is greater than 150    Initially she had some significantly elevated blood sugars but this appears to have moderated in fact blood sugar was 62 this morning it was 143 at noon.  Yesterday morning sugar was 94- at 5 PM was 200 and at at bedtime 211.  Morning sugars recent days have run from 62 up to 187.  Later in the day readings appear to be more mid 100s to low 200s the last few days.  Currently she is sitting in a wheelchair comfortably has no acute complaints- she did had a fall earlier in her stay- apparently there was significant anxiety she does receive Xanax at night she has been started on BuSpar and appears to have responded well to this  She continues at times to have some situational anxiety especially when she feels she is being left alone--- this may contribute to her somewhat elevated systolics at times   Past Medical History:  Diagnosis Date  . Chronic kidney disease    stage II  . Dementia   . Diabetes mellitus   . Hyperlipidemia   . Hypertension    Past Surgical History:  Procedure Laterality Date  . ABDOMINAL HYSTERECTOMY    . BACK SURGERY    . INTRAMEDULLARY (IM) NAIL INTERTROCHANTERIC Left 03/28/2018   Procedure: INTRAMEDULLARY (IM) NAIL INTERTROCHANTRIC;  Surgeon: Arther Abbott  E, MD;  Location: [facility];  Service: Orthopedics;  Laterality: Left;    No Known Allergies  Outpatient Encounter Medications as of 04/11/2018  Medication Sig  . acetaminophen (TYLENOL) 325 MG tablet Take 2 tablets (650 mg total) by mouth every 6 (six) hours as needed for mild pain (or Fever >/= 101).  Marland Kitchen ALPRAZolam (XANAX) 0.25 MG tablet Take 1 tablet (0.25 mg total) by mouth at bedtime as needed.  Marland Kitchen aspirin EC 325 MG EC tablet Take 1 tablet (325 mg total) by mouth daily with breakfast.  . busPIRone (BUSPAR) 5 MG tablet Take 5 mg  by mouth 2 (two) times daily.  . ferrous sulfate (QC FERROUS SULFATE) 325 (65 FE) MG tablet Take 325 mg by mouth daily with breakfast.  . glucose blood test strip Use as instructed for once daily testing  With Easymax meter  . Insulin Glargine (LANTUS SOLOSTAR) 100 UNIT/ML Solostar Pen Inject 12 Units into the skin at bedtime.  . insulin lispro (HUMALOG KWIKPEN) 100 UNIT/ML KiwkPen Inject 2 Units into the skin daily.  . Lancets MISC Use as directed to monitor FSBS 2x daily. Dx: E11.9.  . metoprolol tartrate (LOPRESSOR) 25 MG tablet take 1 tablet by mouth twice a day with food for high blood pressure  . NAMZARIC 28-10 MG CP24 TAKE 1 CAPSULE BY MOUTH ONCE DAILY(BEGIN AFTER TITRATION PACK IS COMPLETED  . phenol (CHLORASEPTIC) 1.4 % LIQD Use as directed 1 spray in the mouth or throat as needed for throat irritation / pain.  . rosuvastatin (CRESTOR) 20 MG tablet take 1 tablet by mouth at bedtime FOR CHOLESTEROL   No facility-administered encounter medications on file as of 04/11/2018.     Review of Systems   This is somewhat limited secondary to dementia  In general she is not complaining of fever chills   Skin does not complain of rashes or itching   Head ears eyes nose mouth and throat she has prescription lenses does not complain of sore throat or visual changes   Respiratory does not complain of shortness of breath or cough  Cardiac does not complain of chest pain has some left lower extremity edema most prominent ankle   GI is not complaining of abdominal pain nausea vomiting diarrhea constipation   GU does not complain of dysuria   Musculoskeletal is not really complaining of joint pain or ankle pain   Neurologic does not complain of dizziness headache or numbness   Psych does have some history of dementia but she is pleasant is able to carry on a short conversation and appears anxious at times-  Immunization History  Administered Date(s) Administered  . Influenza  Split 06/21/2012  . Influenza, High Dose Seasonal PF 07/11/2017  . Influenza,inj,Quad PF,6+ Mos 07/02/2013, 07/30/2014, 09/04/2015, 08/05/2016  . Influenza-Unspecified 08/02/2001, 07/17/2002, 07/27/2006  . Pneumococcal Conjugate-13 11/01/2013  . Pneumococcal Polysaccharide-23 03/29/2012  . Td 03/14/2000  . Zoster 09/20/2012   Pertinent  Health Maintenance Due  Topic Date Due  . OPHTHALMOLOGY EXAM  05/04/2018 (Originally 01/14/2016)  . URINE MICROALBUMIN  05/04/2018 (Originally 09/03/2016)  . DEXA SCAN  05/04/2018 (Originally 10/24/1997)  . HEMOGLOBIN A1C  04/26/2018  . INFLUENZA VACCINE  05/03/2018  . FOOT EXAM  10/27/2018  . PNA vac Low Risk Adult  Completed   Fall Risk  11/28/2017 10/27/2017 04/28/2017 12/05/2016 09/04/2015  Falls in the past year? No No No No No   Functional Status Survey:    Vitals:   04/11/18 1610  BP: (!) 151/69  Pulse: 72  Resp: 20  Temp: 97.6 F (36.4 C)  TempSrc: Oral  SpO2: 100%    Physical Exam   In general this is a pleasant elderly female in no distress sitting comfortably in her wheelchair  Her skin is warm and dry.  Eyes visual acuity appears grossly intact she has prescription lenses   Oropharynx is clear mucous membranes moist   Chest is clear to auscultation there is no labored breathing   Heart is regular rate and rhythm without murmur gallop or rub she has some mild left lower extremity edema most prominently ankle area-  edema is cool to touch nonerythematous nontender pedal pulse is somewhat reduced because of the edema  Capillary refill is intact   Musculoskeletal does have limited motion of her left lower extremity status post surgery otherwise appears to be relatively at baseline--with baseline movement of her lower extremities  Neurologic is grossly intact her speech is clear she is alert-  Touch   sensation lower extremities appears to be intact    Psych she is oriented to self pleasant appropriate can carry on a simple  conversation somewhat anxious about any possibility of being left alone         Labs reviewed: Recent Labs    03/30/18 0423 04/03/18 0400 04/06/18 0854  NA 140 140 140  K 4.3 4.1 4.2  CL 107 110 104  CO2 27 23 26   GLUCOSE 87 149* 228*  BUN 24* 28* 32*  CREATININE 1.27* 1.33* 1.23*  CALCIUM 8.2* 8.2* 8.3*   Recent Labs    04/28/17 0852 10/27/17 0828  AST 15 15  ALT 12 8  ALKPHOS 52  --   BILITOT 0.4 0.3  PROT 6.5 6.4  ALBUMIN 3.9  --    Recent Labs    03/26/18 2059  03/31/18 0450 04/03/18 0400 04/06/18 0854  WBC 16.0*   < > 13.3* 8.7 10.0  NEUTROABS 14.7  --   --  6.2 7.0  HGB 8.5*   < > 10.2* 8.5* 9.0*  HCT 26.4*   < > 31.3* 25.9* 27.7*  MCV 84.6   < > 89.2 88.7 89.4  PLT 155   < > 145* 184 291   < > = values in this interval not displayed.   Lab Results  Component Value Date   TSH 0.423 01/11/2016   Lab Results  Component Value Date   HGBA1C 7.9 (H) 10/27/2017   Lab Results  Component Value Date   CHOL 200 (H) 10/27/2017   HDL 64 10/27/2017   LDLCALC 112 (H) 10/27/2017   TRIG 125 10/27/2017   CHOLHDL 3.1 10/27/2017    Significant Diagnostic Results in last 30 days:  Dg Chest 1 View  Result Date: 03/26/2018 CLINICAL DATA:  Unwitnessed fall at home.  Preop left hip fracture. EXAM: CHEST  1 VIEW COMPARISON:  01/11/2016 FINDINGS: Heart is top-normal in size. Moderate aortic atherosclerosis with ectasia. Clear lungs. No acute osseous abnormality. IMPRESSION: Aortic atherosclerosis.  No active pulmonary disease. Electronically Signed   By: Ashley Royalty M.D.   On: 03/26/2018 22:16   Dg Hip Operative Unilat W Or W/o Pelvis Left  Result Date: 03/28/2018 CLINICAL DATA:  Left hip fracture. EXAM: OPERATIVE left HIP (WITH PELVIS IF PERFORMED) 8 VIEWS TECHNIQUE: Fluoroscopic spot image(s) were submitted for interpretation post-operatively. Radiation exposure index: 21.9 mGy. COMPARISON:  Radiographs of March 26, 2018. FINDINGS: Eight fluoroscopic images of  the left hip demonstrate the patient be status post surgical internal  fixation of intertrochanteric fracture of proximal left femur. IMPRESSION: Status post surgical internal fixation of proximal left femoral intertrochanteric fracture. Electronically Signed   By: Marijo Conception, M.D.   On: 03/28/2018 14:45   Dg Hip Unilat With Pelvis 2-3 Views Left  Result Date: 04/10/2018 [facility] report Dictated by Dr. [name] Chief complaint hip fracture repair, left hip gamma nail Short gamma nail was seen in the left hip.  Compared to prior preop films of fracture has impacted.  The Intra-Op films show a fracture gap is closed with skin appropriate sliding of the nail Impression fracture is healing appropriately with good position of hardware in the center of the femoral head   Dg Hip Unilat With Pelvis 2-3 Views Left  Result Date: 03/26/2018 CLINICAL DATA:  Unwitnessed fall at home today.  Pain. EXAM: DG HIP (WITH OR WITHOUT PELVIS) 2-3V LEFT COMPARISON:  None. FINDINGS: Acute, varus angulated intertrochanteric fracture of the left femur with avulsed lesser trochanter. Status post L4-5 lumbar fusion with hardware in place. No acute pelvic fracture. The native right hip appears intact. IMPRESSION: Acute, varus angulated intertrochanteric fracture of the left femur with avulsed lesser trochanter. Electronically Signed   By: Ashley Royalty M.D.   On: 03/26/2018 22:08    Assessment/Plan  #1 left lower extremity edema most prominently ankle--with history of left hip repair---this does not appear to be uncomfortable and could be typical postop- she is on aspirin 325 mg a day for 28 days for DVT prophylaxis-  Will order a venous Doppler however to rule out any possibility of a DVT-also will obtain an x-ray of the area  In regards to pain management she does have an order for Tylenol as needed- at this point pain does not appear to be a majorg issue but will have to be watched  Clinically she  appears to be stable sitting comfortably in her wheelchair   type 2 diabetes somewhat concerned about the lower blood sugar this morning of 62- sugars appear to have moderated under Lantus 12 units --she also receives 2 units of Humalog at lunch if CBG is greater than 150.  We will reduce the Lantus at least temporarily to 8 units to minimize a.m. hypoglycemia- suspect we will have to titrate this in the future but would like to avoid lower blood sugars in the morning---  #3 hypertension as noted above some of the systolic elevations may be more anxiety related at this point will monitor continue Lopressor twice daily- I have seen previously systolics in the 786V as well but this will have to be watched   #4 postop anemia she is on iron and hemoglobin has shown improvement at 9.0 on lab done July 5 at this point will continue to monitor  #5 history of chronic kidney disease this also has shown improvement with a creatinine of 1.23 BUN of 32 on lab done on July 5- will continue to monitor   #6 anxiety- time anxiety restlessness was added significant issue earlier in her stay but appears to have improved in addition to the Xanax as needed at night she is on BuSpar now    [id number]  Addendum we have received the results of the venous Doppler which was negative for any DVT.  Will await results of x-rays--

## 2018-04-11 NOTE — Telephone Encounter (Signed)
I gave the order, they will remove staples.

## 2018-04-11 NOTE — Telephone Encounter (Signed)
Why not????  Post op 2 week appt is for staples to come out   So give the order and can we make that protocol ?  You and reneee should know that

## 2018-04-12 ENCOUNTER — Encounter: Payer: Self-pay | Admitting: Internal Medicine

## 2018-04-12 NOTE — Progress Notes (Signed)
Location:   [facility] Room Number: 103/P Place of Service:  SNF (31) Provider:  Veleta Miners MD  Alycia Rossetti, MD  Patient Care Team: Alycia Rossetti, MD as PCP - [facility])  Extended Emergency Contact Information Primary Emergency Contact: Hopkins,Tammie Address: [address]          [place], [place] [address] [place] Phone: [phone] Relation: Daughter Secondary Emergency Contact: Nira Retort [place] Mobile Phone: [phone] Relation: Son  Code Status:  Full Code Goals of care: Advanced Directive information Advanced Directives 04/12/2018  Does Patient Have a Medical Advance Directive? Yes  Type of Advance Directive (No Data)  Does patient want to make changes to medical advance directive? No - Patient declined  Would patient like information on creating a medical advance directive? No - Patient declined     Chief Complaint  Patient presents with  . Acute Visit    Patient is being seen for F/U Visit    HPI:  Pt is a 84 y.o. female seen today for an acute visit for    Past Medical History:  Diagnosis Date  . Chronic kidney disease    stage II  . Dementia   . Diabetes mellitus   . Hyperlipidemia   . Hypertension    Past Surgical History:  Procedure Laterality Date  . ABDOMINAL HYSTERECTOMY    . BACK SURGERY    . INTRAMEDULLARY (IM) NAIL INTERTROCHANTERIC Left 03/28/2018   Procedure: INTRAMEDULLARY (IM) NAIL INTERTROCHANTRIC;  Surgeon: Carole Civil, MD;  Location: [facility];  Service: Orthopedics;  Laterality: Left;    No Known Allergies  Outpatient Encounter Medications as of 04/12/2018  Medication Sig  . acetaminophen (TYLENOL) 325 MG tablet Take 2 tablets (650 mg total) by mouth every 6 (six) hours as needed for mild pain (or Fever >/= 101).  Marland Kitchen ALPRAZolam (XANAX) 0.25 MG tablet Take 1 tablet (0.25 mg total) by mouth at bedtime as needed.  Marland Kitchen aspirin EC 325 MG  EC tablet Take 1 tablet (325 mg total) by mouth daily with breakfast.  . busPIRone (BUSPAR) 5 MG tablet Take 5 mg by mouth 2 (two) times daily.  . ferrous sulfate (QC FERROUS SULFATE) 325 (65 FE) MG tablet Take 325 mg by mouth daily with breakfast.  . glucose blood test strip Use as instructed for once daily testing  With Easymax meter  . Insulin Glargine (LANTUS SOLOSTAR) 100 UNIT/ML Solostar Pen Inject 12 Units into the skin at bedtime.  . insulin lispro (HUMALOG KWIKPEN) 100 UNIT/ML KiwkPen Inject 2 Units into the skin daily.  . Lancets MISC Use as directed to monitor FSBS 2x daily. Dx: E11.9.  . metoprolol tartrate (LOPRESSOR) 25 MG tablet take 1 tablet by mouth twice a day with food for high blood pressure  . NAMZARIC 28-10 MG CP24 TAKE 1 CAPSULE BY MOUTH ONCE DAILY(BEGIN AFTER TITRATION PACK IS COMPLETED  . phenol (CHLORASEPTIC) 1.4 % LIQD Use as directed 1 spray in the mouth or throat as needed for throat irritation / pain.  . rosuvastatin (CRESTOR) 20 MG tablet take 1 tablet by mouth at bedtime FOR CHOLESTEROL   No facility-administered encounter medications on file as of 04/12/2018.      Review of Systems  Immunization History  Administered Date(s) Administered  . Influenza Split 06/21/2012  . Influenza, High Dose Seasonal PF 07/11/2017  . Influenza,inj,Quad PF,6+ Mos 07/02/2013, 07/30/2014, 09/04/2015, 08/05/2016  . Influenza-Unspecified 08/02/2001, 07/17/2002, 07/27/2006  .  Pneumococcal Conjugate-13 11/01/2013  . Pneumococcal Polysaccharide-23 03/29/2012  . Td 03/14/2000  . Zoster 09/20/2012   Pertinent  Health Maintenance Due  Topic Date Due  . OPHTHALMOLOGY EXAM  05/04/2018 (Originally 01/14/2016)  . URINE MICROALBUMIN  05/04/2018 (Originally 09/03/2016)  . DEXA SCAN  05/04/2018 (Originally 10/24/1997)  . HEMOGLOBIN A1C  04/26/2018  . INFLUENZA VACCINE  05/03/2018  . FOOT EXAM  10/27/2018  . PNA vac Low Risk Adult  Completed   Fall Risk  11/28/2017 10/27/2017 04/28/2017  12/05/2016 09/04/2015  Falls in the past year? No No No No No   Functional Status Survey:    Vitals:   04/12/18 0949  BP: (!) 140/39  Pulse: 84  Resp: 20  Temp: 98.5 F (36.9 C)  TempSrc: Oral  SpO2: 100%   There is no height or weight on file to calculate BMI. Physical Exam  Labs reviewed: Recent Labs    03/30/18 0423 04/03/18 0400 04/06/18 0854  NA 140 140 140  K 4.3 4.1 4.2  CL 107 110 104  CO2 27 23 26   GLUCOSE 87 149* 228*  BUN 24* 28* 32*  CREATININE 1.27* 1.33* 1.23*  CALCIUM 8.2* 8.2* 8.3*   Recent Labs    04/28/17 0852 10/27/17 0828  AST 15 15  ALT 12 8  ALKPHOS 52  --   BILITOT 0.4 0.3  PROT 6.5 6.4  ALBUMIN 3.9  --    Recent Labs    03/26/18 2059  03/31/18 0450 04/03/18 0400 04/06/18 0854  WBC 16.0*   < > 13.3* 8.7 10.0  NEUTROABS 14.7  --   --  6.2 7.0  HGB 8.5*   < > 10.2* 8.5* 9.0*  HCT 26.4*   < > 31.3* 25.9* 27.7*  MCV 84.6   < > 89.2 88.7 89.4  PLT 155   < > 145* 184 291   < > = values in this interval not displayed.   Lab Results  Component Value Date   TSH 0.423 01/11/2016   Lab Results  Component Value Date   HGBA1C 7.9 (H) 10/27/2017   Lab Results  Component Value Date   CHOL 200 (H) 10/27/2017   HDL 64 10/27/2017   LDLCALC 112 (H) 10/27/2017   TRIG 125 10/27/2017   CHOLHDL 3.1 10/27/2017    Significant Diagnostic Results in last 30 days:  Dg Chest 1 View  Result Date: 03/26/2018 CLINICAL DATA:  Unwitnessed fall at home.  Preop left hip fracture. EXAM: CHEST  1 VIEW COMPARISON:  01/11/2016 FINDINGS: Heart is top-normal in size. Moderate aortic atherosclerosis with ectasia. Clear lungs. No acute osseous abnormality. IMPRESSION: Aortic atherosclerosis.  No active pulmonary disease. Electronically Signed   By: Ashley Royalty M.D.   On: 03/26/2018 22:16   Dg Tibia/fibula Left  Result Date: 04/12/2018 CLINICAL DATA:  Left leg and hip pain with swelling. EXAM: LEFT TIBIA AND FIBULA - 2 VIEW COMPARISON:  Knee radiographs from  08/06/2010 FINDINGS: Moderate spurring of the tibial spine and medial compartmental margin noted. Atherosclerotic vascular calcifications are present. Marginal spurring of the patella along with quadriceps spurring noted. No appreciable knee effusion. Stranding in the subcutaneous adipose tissues suggest subcutaneous edema especially laterally and in the lower calf. IMPRESSION: 1. No acute bony findings. 2. Subcutaneous edema in the calf. 3. Atherosclerosis. 4. Osteoarthritis of the knee. Electronically Signed   By: Van Clines M.D.   On: 04/12/2018 08:59   Dg Ankle Complete Left  Result Date: 04/12/2018 CLINICAL DATA:  Left leg  pain and swelling. EXAM: LEFT ANKLE COMPLETE - 3+ VIEW COMPARISON:  None. FINDINGS: Notable subcutaneous edema overlying the malleoli. There is a lesser degree of subcutaneous edema in the distal calf. Soft tissue swelling is also present below the malleoli and along the heel. Large Achilles calcaneal spur with smaller plantar calcaneal spur. No fracture or acute bony findings. IMPRESSION: 1. Soft tissue swelling along the ankle, heel, and lower calf. No underlying bony defect identified. 2. Calcaneal spurring. Electronically Signed   By: Van Clines M.D.   On: 04/12/2018 09:04   US Venous Img Lower Unilateral Left  Result Date: 04/11/2018 CLINICAL DATA:  84 year old female with a history of left lower extremity edema EXAM: LEFT LOWER EXTREMITY VENOUS DOPPLER ULTRASOUND TECHNIQUE: Gray-scale sonography with graded compression, as well as color Doppler and duplex ultrasound were performed to evaluate the lower extremity deep venous systems from the level of the common femoral vein and including the common femoral, femoral, profunda femoral, popliteal and calf veins including the posterior tibial, peroneal and gastrocnemius veins when visible. The superficial great saphenous vein was also interrogated. Spectral Doppler was utilized to evaluate flow at rest and with distal  augmentation maneuvers in the common femoral, femoral and popliteal veins. COMPARISON:  None. FINDINGS: Contralateral Common Femoral Vein: Respiratory phasicity is normal and symmetric with the symptomatic side. No evidence of thrombus. Normal compressibility. Common Femoral Vein: No evidence of thrombus. Normal compressibility, respiratory phasicity and response to augmentation. Saphenofemoral Junction: No evidence of thrombus. Normal compressibility and flow on color Doppler imaging. Profunda Femoral Vein: No evidence of thrombus. Normal compressibility and flow on color Doppler imaging. Femoral Vein: No evidence of thrombus. Normal compressibility, respiratory phasicity and response to augmentation. Popliteal Vein: No evidence of thrombus. Normal compressibility, respiratory phasicity and response to augmentation. Calf Veins: No evidence of thrombus. Normal compressibility and flow on color Doppler imaging. Superficial Great Saphenous Vein: No evidence of thrombus. Normal compressibility. Venous Reflux:  None. Other Findings:  None. IMPRESSION: No evidence of deep venous thrombosis. Electronically Signed   By: Jacqulynn Cadet M.D.   On: 04/11/2018 17:32   Dg Hip Operative Unilat W Or W/o Pelvis Left  Result Date: 03/28/2018 CLINICAL DATA:  Left hip fracture. EXAM: OPERATIVE left HIP (WITH PELVIS IF PERFORMED) 8 VIEWS TECHNIQUE: Fluoroscopic spot image(s) were submitted for interpretation post-operatively. Radiation exposure index: 21.9 mGy. COMPARISON:  Radiographs of March 26, 2018. FINDINGS: Eight fluoroscopic images of the left hip demonstrate the patient be status post surgical internal fixation of intertrochanteric fracture of proximal left femur. IMPRESSION: Status post surgical internal fixation of proximal left femoral intertrochanteric fracture. Electronically Signed   By: Marijo Conception, M.D.   On: 03/28/2018 14:45   Dg Hip Unilat With Pelvis 2-3 Views Left  Result Date: 04/12/2018 CLINICAL  DATA:  Left hip and lower leg pain. EXAM: DG HIP (WITH OR WITHOUT PELVIS) 2-3V LEFT COMPARISON:  03/26/2018 FINDINGS: Left hip intertrochanteric nail with good alignment across the intertrochanteric fracture site. Displaced lesser trochanteric fragment unchanged. Lower lumbar posterolateral rod and pedicle screw fixators. Bony demineralization. Vascular calcifications. IMPRESSION: 1. Left hip intertrochanteric nail with good alignment across the intertrochanteric fracture site. No acute findings. 2. Bony demineralization. 3. Vascular calcifications. 4. Incidental note made of displaced lesser trochanteric fragment (unchanged). Electronically Signed   By: Van Clines M.D.   On: 04/12/2018 09:03   Dg Hip Unilat With Pelvis 2-3 Views Left  Result Date: 04/10/2018 [facility] report Dictated by Dr. [name] Chief complaint  hip fracture repair, left hip gamma nail Short gamma nail was seen in the left hip.  Compared to prior preop films of fracture has impacted.  The Intra-Op films show a fracture gap is closed with skin appropriate sliding of the nail Impression fracture is healing appropriately with good position of hardware in the center of the femoral head   Dg Hip Unilat With Pelvis 2-3 Views Left  Result Date: 03/26/2018 CLINICAL DATA:  Unwitnessed fall at home today.  Pain. EXAM: DG HIP (WITH OR WITHOUT PELVIS) 2-3V LEFT COMPARISON:  None. FINDINGS: Acute, varus angulated intertrochanteric fracture of the left femur with avulsed lesser trochanter. Status post L4-5 lumbar fusion with hardware in place. No acute pelvic fracture. The native right hip appears intact. IMPRESSION: Acute, varus angulated intertrochanteric fracture of the left femur with avulsed lesser trochanter. Electronically Signed   By: Ashley Royalty M.D.   On: 03/26/2018 22:08    Assessment/Plan There are no diagnoses linked to this encounter.   Family/ staff Communication:   Labs/tests ordered:    This  encounter was created in error - please disregard.

## 2018-04-15 ENCOUNTER — Other Ambulatory Visit: Payer: Self-pay | Admitting: Family Medicine

## 2018-04-17 ENCOUNTER — Non-Acute Institutional Stay (SKILLED_NURSING_FACILITY): Payer: Medicare HMO | Admitting: Internal Medicine

## 2018-04-17 ENCOUNTER — Encounter: Payer: Self-pay | Admitting: Internal Medicine

## 2018-04-17 DIAGNOSIS — F419 Anxiety disorder, unspecified: Secondary | ICD-10-CM

## 2018-04-17 DIAGNOSIS — I10 Essential (primary) hypertension: Secondary | ICD-10-CM

## 2018-04-17 DIAGNOSIS — N183 Chronic kidney disease, stage 3 unspecified: Secondary | ICD-10-CM

## 2018-04-17 DIAGNOSIS — F0151 Vascular dementia with behavioral disturbance: Secondary | ICD-10-CM

## 2018-04-17 DIAGNOSIS — F01518 Vascular dementia, unspecified severity, with other behavioral disturbance: Secondary | ICD-10-CM

## 2018-04-17 DIAGNOSIS — Z967 Presence of other bone and tendon implants: Secondary | ICD-10-CM | POA: Diagnosis not present

## 2018-04-17 DIAGNOSIS — Z8781 Personal history of (healed) traumatic fracture: Secondary | ICD-10-CM | POA: Diagnosis not present

## 2018-04-17 DIAGNOSIS — E782 Mixed hyperlipidemia: Secondary | ICD-10-CM

## 2018-04-17 DIAGNOSIS — E1122 Type 2 diabetes mellitus with diabetic chronic kidney disease: Secondary | ICD-10-CM | POA: Diagnosis not present

## 2018-04-17 DIAGNOSIS — Z9889 Other specified postprocedural states: Secondary | ICD-10-CM

## 2018-04-17 DIAGNOSIS — R69 Illness, unspecified: Secondary | ICD-10-CM | POA: Diagnosis not present

## 2018-04-17 NOTE — Progress Notes (Signed)
Location:   [facility] Room Number: 103/P Place of Service:  SNF (31) Provider:  Veleta Miners MD  Alycia Rossetti, MD  Patient Care Team: Alycia Rossetti, MD as PCP - [facility])  Extended Emergency Contact Information Primary Emergency Contact: Hopkins,Tammie Address: [address]          [place], [place] [address] [place] Phone: [phone] Relation: Daughter Secondary Emergency Contact: Nira Retort [place] Mobile Phone: [phone] Relation: Son  Code Status:  Full Code Goals of care: Advanced Directive information Advanced Directives 04/17/2018  Does Patient Have a Medical Advance Directive? Yes  Type of Advance Directive (No Data)  Does patient want to make changes to medical advance directive? No - Patient declined  Would patient like information on creating a medical advance directive? No - Patient declined     Chief Complaint  Patient presents with  . Acute Visit    Patient is being seen for a F/U Visit    HPI:  Pt is a 84 y.o. female seen today for an acute visit for Depression and Anxiety Patient has h/o dementia, diabetes mellitus, hypertension,CKD, hyperlipidemia  She was admitted to SNF for rehab after hospitalization 06/24-07/01 for left femur fracture sustained in a fall.  She underwent left intramedullary nail placement on 06/26.  Her postop course was complicated with urinary retention.  When she came to the facility she had a chronic Foley which was eventually removed removed and.  Patient is doing well. Because of her anxiety with her dementia she was started on trial of  BuSpar. According to the nurses patient has done very well and they want this to be continued. Patient has lost some weight from 130 to 121 Lbs. But per nurses she is eating well and is getting Glucerna for her supplement Patient unable to give me any history due to her dementia. Per nurses beside  her anxiety and depression there is no new nursing issues    Past Medical History:  Diagnosis Date  . Chronic kidney disease    stage II  . Dementia   . Diabetes mellitus   . Hyperlipidemia   . Hypertension    Past Surgical History:  Procedure Laterality Date  . ABDOMINAL HYSTERECTOMY    . BACK SURGERY    . INTRAMEDULLARY (IM) NAIL INTERTROCHANTERIC Left 03/28/2018   Procedure: INTRAMEDULLARY (IM) NAIL INTERTROCHANTRIC;  Surgeon: Carole Civil, MD;  Location: [facility];  Service: Orthopedics;  Laterality: Left;    No Known Allergies  Outpatient Encounter Medications as of 04/17/2018  Medication Sig  . acetaminophen (TYLENOL) 325 MG tablet Take 2 tablets (650 mg total) by mouth every 6 (six) hours as needed for mild pain (or Fever >/= 101).  Marland Kitchen ALPRAZolam (XANAX) 0.25 MG tablet Take 1 tablet (0.25 mg total) by mouth at bedtime as needed.  Marland Kitchen aspirin EC 325 MG EC tablet Take 1 tablet (325 mg total) by mouth daily with breakfast.  . busPIRone (BUSPAR) 5 MG tablet Take 5 mg by mouth 2 (two) times daily.  . ferrous sulfate (QC FERROUS SULFATE) 325 (65 FE) MG tablet Take 325 mg by mouth daily with breakfast.  . glucose blood test strip Use as instructed for once daily testing  With Easymax meter  . Insulin Glargine (LANTUS SOLOSTAR) 100 UNIT/ML Solostar Pen Inject 8 Units into the skin at bedtime.   . insulin lispro (HUMALOG KWIKPEN) 100 UNIT/ML KiwkPen Inject 2 Units into the  skin daily.  . Lancets MISC Use as directed to monitor FSBS 2x daily. Dx: E11.9.  . metoprolol tartrate (LOPRESSOR) 25 MG tablet take 1 tablet by mouth twice a day with food for high blood pressure  . NAMZARIC 28-10 MG CP24 TAKE 1 CAPSULE BY MOUTH ONCE DAILY(BEGIN AFTER TITRATION PACK IS COMPLETED  . phenol (CHLORASEPTIC) 1.4 % LIQD Use as directed 1 spray in the mouth or throat as needed for throat irritation / pain.  . rosuvastatin (CRESTOR) 20 MG tablet take 1 tablet by mouth at bedtime FOR CHOLESTEROL   No  facility-administered encounter medications on file as of 04/17/2018.      Review of Systems  Unable to perform ROS: Dementia    Immunization History  Administered Date(s) Administered  . Influenza Split 06/21/2012  . Influenza, High Dose Seasonal PF 07/11/2017  . Influenza,inj,Quad PF,6+ Mos 07/02/2013, 07/30/2014, 09/04/2015, 08/05/2016  . Influenza-Unspecified 08/02/2001, 07/17/2002, 07/27/2006  . Pneumococcal Conjugate-13 11/01/2013  . Pneumococcal Polysaccharide-23 03/29/2012  . Td 03/14/2000  . Zoster 09/20/2012   Pertinent  Health Maintenance Due  Topic Date Due  . OPHTHALMOLOGY EXAM  05/04/2018 (Originally 01/14/2016)  . URINE MICROALBUMIN  05/04/2018 (Originally 09/03/2016)  . DEXA SCAN  05/04/2018 (Originally 10/24/1997)  . HEMOGLOBIN A1C  04/26/2018  . INFLUENZA VACCINE  05/03/2018  . FOOT EXAM  10/27/2018  . PNA vac Low Risk Adult  Completed   Fall Risk  11/28/2017 10/27/2017 04/28/2017 12/05/2016 09/04/2015  Falls in the past year? No No No No No   Functional Status Survey:    Vitals:   04/17/18 1302  BP: (!) 130/51  Pulse: 77  Resp: 20  Temp: (!) 97.1 F (36.2 C)  TempSrc: Oral  SpO2: 96%   There is no height or weight on file to calculate BMI. Physical Exam  Constitutional: She appears well-developed.  HENT:  Head: Normocephalic.  Mouth/Throat: Oropharynx is clear and moist.  Eyes: Pupils are equal, round, and reactive to light.  Neck: Neck supple.  Cardiovascular: Normal rate and regular rhythm.  Pulmonary/Chest: Effort normal and breath sounds normal. No stridor. No respiratory distress.  Abdominal: Soft. Bowel sounds are normal. She exhibits no distension. There is no tenderness. There is no guarding.  Musculoskeletal: She exhibits no edema.  Neurological: She is alert.  Not oriented to place or time Follows simple Commands and has no focal Deficits  Skin: Skin is warm and dry.  Psychiatric: She has a normal mood and affect. Her behavior is normal.  Thought content normal.    Labs reviewed: Recent Labs    03/30/18 0423 04/03/18 0400 04/06/18 0854  NA 140 140 140  K 4.3 4.1 4.2  CL 107 110 104  CO2 27 23 26   GLUCOSE 87 149* 228*  BUN 24* 28* 32*  CREATININE 1.27* 1.33* 1.23*  CALCIUM 8.2* 8.2* 8.3*   Recent Labs    04/28/17 0852 10/27/17 0828  AST 15 15  ALT 12 8  ALKPHOS 52  --   BILITOT 0.4 0.3  PROT 6.5 6.4  ALBUMIN 3.9  --    Recent Labs    03/26/18 2059  03/31/18 0450 04/03/18 0400 04/06/18 0854  WBC 16.0*   < > 13.3* 8.7 10.0  NEUTROABS 14.7  --   --  6.2 7.0  HGB 8.5*   < > 10.2* 8.5* 9.0*  HCT 26.4*   < > 31.3* 25.9* 27.7*  MCV 84.6   < > 89.2 88.7 89.4  PLT 155   < >  145* 184 291   < > = values in this interval not displayed.   Lab Results  Component Value Date   TSH 0.423 01/11/2016   Lab Results  Component Value Date   HGBA1C 7.9 (H) 10/27/2017   Lab Results  Component Value Date   CHOL 200 (H) 10/27/2017   HDL 64 10/27/2017   LDLCALC 112 (H) 10/27/2017   TRIG 125 10/27/2017   CHOLHDL 3.1 10/27/2017    Significant Diagnostic Results in last 30 days:  Dg Chest 1 View  Result Date: 03/26/2018 CLINICAL DATA:  Unwitnessed fall at home.  Preop left hip fracture. EXAM: CHEST  1 VIEW COMPARISON:  01/11/2016 FINDINGS: Heart is top-normal in size. Moderate aortic atherosclerosis with ectasia. Clear lungs. No acute osseous abnormality. IMPRESSION: Aortic atherosclerosis.  No active pulmonary disease. Electronically Signed   By: Ashley Royalty M.D.   On: 03/26/2018 22:16   Dg Tibia/fibula Left  Result Date: 04/12/2018 CLINICAL DATA:  Left leg and hip pain with swelling. EXAM: LEFT TIBIA AND FIBULA - 2 VIEW COMPARISON:  Knee radiographs from 08/06/2010 FINDINGS: Moderate spurring of the tibial spine and medial compartmental margin noted. Atherosclerotic vascular calcifications are present. Marginal spurring of the patella along with quadriceps spurring noted. No appreciable knee effusion. Stranding  in the subcutaneous adipose tissues suggest subcutaneous edema especially laterally and in the lower calf. IMPRESSION: 1. No acute bony findings. 2. Subcutaneous edema in the calf. 3. Atherosclerosis. 4. Osteoarthritis of the knee. Electronically Signed   By: Van Clines M.D.   On: 04/12/2018 08:59   Dg Ankle Complete Left  Result Date: 04/12/2018 CLINICAL DATA:  Left leg pain and swelling. EXAM: LEFT ANKLE COMPLETE - 3+ VIEW COMPARISON:  None. FINDINGS: Notable subcutaneous edema overlying the malleoli. There is a lesser degree of subcutaneous edema in the distal calf. Soft tissue swelling is also present below the malleoli and along the heel. Large Achilles calcaneal spur with smaller plantar calcaneal spur. No fracture or acute bony findings. IMPRESSION: 1. Soft tissue swelling along the ankle, heel, and lower calf. No underlying bony defect identified. 2. Calcaneal spurring. Electronically Signed   By: Van Clines M.D.   On: 04/12/2018 09:04   US Venous Img Lower Unilateral Left  Result Date: 04/11/2018 CLINICAL DATA:  84 year old female with a history of left lower extremity edema EXAM: LEFT LOWER EXTREMITY VENOUS DOPPLER ULTRASOUND TECHNIQUE: Gray-scale sonography with graded compression, as well as color Doppler and duplex ultrasound were performed to evaluate the lower extremity deep venous systems from the level of the common femoral vein and including the common femoral, femoral, profunda femoral, popliteal and calf veins including the posterior tibial, peroneal and gastrocnemius veins when visible. The superficial great saphenous vein was also interrogated. Spectral Doppler was utilized to evaluate flow at rest and with distal augmentation maneuvers in the common femoral, femoral and popliteal veins. COMPARISON:  None. FINDINGS: Contralateral Common Femoral Vein: Respiratory phasicity is normal and symmetric with the symptomatic side. No evidence of thrombus. Normal compressibility.  Common Femoral Vein: No evidence of thrombus. Normal compressibility, respiratory phasicity and response to augmentation. Saphenofemoral Junction: No evidence of thrombus. Normal compressibility and flow on color Doppler imaging. Profunda Femoral Vein: No evidence of thrombus. Normal compressibility and flow on color Doppler imaging. Femoral Vein: No evidence of thrombus. Normal compressibility, respiratory phasicity and response to augmentation. Popliteal Vein: No evidence of thrombus. Normal compressibility, respiratory phasicity and response to augmentation. Calf Veins: No evidence of thrombus. Normal compressibility and flow  on color Doppler imaging. Superficial Great Saphenous Vein: No evidence of thrombus. Normal compressibility. Venous Reflux:  None. Other Findings:  None. IMPRESSION: No evidence of deep venous thrombosis. Electronically Signed   By: Jacqulynn Cadet M.D.   On: 04/11/2018 17:32   Dg Hip Operative Unilat W Or W/o Pelvis Left  Result Date: 03/28/2018 CLINICAL DATA:  Left hip fracture. EXAM: OPERATIVE left HIP (WITH PELVIS IF PERFORMED) 8 VIEWS TECHNIQUE: Fluoroscopic spot image(s) were submitted for interpretation post-operatively. Radiation exposure index: 21.9 mGy. COMPARISON:  Radiographs of March 26, 2018. FINDINGS: Eight fluoroscopic images of the left hip demonstrate the patient be status post surgical internal fixation of intertrochanteric fracture of proximal left femur. IMPRESSION: Status post surgical internal fixation of proximal left femoral intertrochanteric fracture. Electronically Signed   By: Marijo Conception, M.D.   On: 03/28/2018 14:45   Dg Hip Unilat With Pelvis 2-3 Views Left  Result Date: 04/12/2018 CLINICAL DATA:  Left hip and lower leg pain. EXAM: DG HIP (WITH OR WITHOUT PELVIS) 2-3V LEFT COMPARISON:  03/26/2018 FINDINGS: Left hip intertrochanteric nail with good alignment across the intertrochanteric fracture site. Displaced lesser trochanteric fragment unchanged.  Lower lumbar posterolateral rod and pedicle screw fixators. Bony demineralization. Vascular calcifications. IMPRESSION: 1. Left hip intertrochanteric nail with good alignment across the intertrochanteric fracture site. No acute findings. 2. Bony demineralization. 3. Vascular calcifications. 4. Incidental note made of displaced lesser trochanteric fragment (unchanged). Electronically Signed   By: Van Clines M.D.   On: 04/12/2018 09:03   Dg Hip Unilat With Pelvis 2-3 Views Left  Result Date: 04/10/2018 [facility] report Dictated by Dr. [name] Chief complaint hip fracture repair, left hip gamma nail Short gamma nail was seen in the left hip.  Compared to prior preop films of fracture has impacted.  The Intra-Op films show a fracture gap is closed with skin appropriate sliding of the nail Impression fracture is healing appropriately with good position of hardware in the center of the femoral head   Dg Hip Unilat With Pelvis 2-3 Views Left  Result Date: 03/26/2018 CLINICAL DATA:  Unwitnessed fall at home today.  Pain. EXAM: DG HIP (WITH OR WITHOUT PELVIS) 2-3V LEFT COMPARISON:  None. FINDINGS: Acute, varus angulated intertrochanteric fracture of the left femur with avulsed lesser trochanter. Status post L4-5 lumbar fusion with hardware in place. No acute pelvic fracture. The native right hip appears intact. IMPRESSION: Acute, varus angulated intertrochanteric fracture of the left femur with avulsed lesser trochanter. Electronically Signed   By: Ashley Royalty M.D.   On: 03/26/2018 22:08    Assessment/Plan  Essential hypertension Blood pressure stable On metoprolol twice a day  Type 2 diabetes mellitus  Patient started on Lantus Glipizide was discontinued Her blood sugars during lunchtime are still more than 250 Will increase her bolus insulin  Vascular dementia with behavior disturbance Patient is actually doing well on BuSpar and Namzaric We will continue both for  now CKD (chronic kidney disease), stage III ([place]) Creatinine stable  S/P ORIF (open reduction internal fixation) fracture IM nail left femur 03/28/18 Pain controlled on Tylenol Working with therapy Follow-up with Ortho On aspirin for DVT prophylaxis   hyperlipidemia Patient on statin  Anxiety We will continue BuSpar for now.  Consider SSRI if her symptoms worsen     Family/ staff Communication:   Labs/tests ordered:   Total time spent in this patient care encounter was 25_ minutes; greater than 50% of the visit spent counseling patient, reviewing records , Labs  and coordinating care for problems addressed at this encounter.

## 2018-04-24 ENCOUNTER — Encounter (HOSPITAL_COMMUNITY)
Admission: RE | Admit: 2018-04-24 | Discharge: 2018-04-24 | Disposition: A | Discharge status: Home / Self Care | Payer: Medicare HMO | Source: Skilled Nursing Facility | Attending: Internal Medicine | Admitting: Internal Medicine

## 2018-04-24 DIAGNOSIS — Z4789 Encounter for other orthopedic aftercare: Secondary | ICD-10-CM | POA: Insufficient documentation

## 2018-04-24 DIAGNOSIS — Z9181 History of falling: Secondary | ICD-10-CM | POA: Insufficient documentation

## 2018-04-24 DIAGNOSIS — S72142D Displaced intertrochanteric fracture of left femur, subsequent encounter for closed fracture with routine healing: Secondary | ICD-10-CM | POA: Insufficient documentation

## 2018-04-24 LAB — CBC WITH DIFFERENTIAL/PLATELET
Basophils Absolute: 0 10*3/uL (ref 0.0–0.1)
Basophils Relative: 0 %
Eosinophils Absolute: 0.1 10*3/uL (ref 0.0–0.7)
Eosinophils Relative: 1 %
HEMATOCRIT: 30.1 % — AB (ref 36.0–46.0)
Hemoglobin: 9.4 g/dL — ABNORMAL LOW (ref 12.0–15.0)
LYMPHS ABS: 1.5 10*3/uL (ref 0.7–4.0)
Lymphocytes Relative: 22 %
MCH: 28.1 pg (ref 26.0–34.0)
MCHC: 31.2 g/dL (ref 30.0–36.0)
MCV: 90.1 fL (ref 78.0–100.0)
MONO ABS: 0.8 10*3/uL (ref 0.1–1.0)
MONOS PCT: 11 %
NEUTROS ABS: 4.6 10*3/uL (ref 1.7–7.7)
Neutrophils Relative %: 66 %
Platelets: 265 10*3/uL (ref 150–400)
RBC: 3.34 MIL/uL — ABNORMAL LOW (ref 3.87–5.11)
RDW: 14.7 % (ref 11.5–15.5)
WBC: 7 10*3/uL (ref 4.0–10.5)

## 2018-04-24 LAB — BASIC METABOLIC PANEL
Anion gap: 5 (ref 5–15)
BUN: 32 mg/dL — ABNORMAL HIGH (ref 8–23)
CALCIUM: 8.5 mg/dL — AB (ref 8.9–10.3)
CO2: 28 mmol/L (ref 22–32)
Chloride: 108 mmol/L (ref 98–111)
Creatinine, Ser: 1.26 mg/dL — ABNORMAL HIGH (ref 0.44–1.00)
GFR calc Af Amer: 44 mL/min — ABNORMAL LOW (ref >60)
GFR, EST NON AFRICAN AMERICAN: 38 mL/min — AB (ref >60)
GLUCOSE: 129 mg/dL — AB (ref 70–99)
POTASSIUM: 4.1 mmol/L (ref 3.5–5.1)
Sodium: 141 mmol/L (ref 135–145)

## 2018-05-03 ENCOUNTER — Non-Acute Institutional Stay (SKILLED_NURSING_FACILITY): Payer: Medicare HMO | Admitting: Internal Medicine

## 2018-05-03 ENCOUNTER — Encounter: Payer: Self-pay | Admitting: Internal Medicine

## 2018-05-03 DIAGNOSIS — Z8781 Personal history of (healed) traumatic fracture: Secondary | ICD-10-CM | POA: Diagnosis not present

## 2018-05-03 DIAGNOSIS — N183 Chronic kidney disease, stage 3 unspecified: Secondary | ICD-10-CM

## 2018-05-03 DIAGNOSIS — Z967 Presence of other bone and tendon implants: Secondary | ICD-10-CM

## 2018-05-03 DIAGNOSIS — E1122 Type 2 diabetes mellitus with diabetic chronic kidney disease: Secondary | ICD-10-CM | POA: Diagnosis not present

## 2018-05-03 DIAGNOSIS — I10 Essential (primary) hypertension: Secondary | ICD-10-CM | POA: Diagnosis not present

## 2018-05-03 DIAGNOSIS — Z9889 Other specified postprocedural states: Secondary | ICD-10-CM

## 2018-05-03 NOTE — Progress Notes (Signed)
This encounter was created in error - please disregard.

## 2018-05-03 NOTE — Progress Notes (Signed)
Location:   [facility] Room Number: 103/P Place of Service:  SNF (31)  Provider: Veleta Miners MD  PCP: Alycia Rossetti, MD Patient Care Team: Alycia Rossetti, MD as PCP - General [facility] Medicine)  Extended Emergency Contact Information Primary Emergency Contact: Hopkins,Tammie Address: [address]          [place], [place] [address] [place] Phone: [phone] Relation: Daughter Secondary Emergency Contact: Nira Retort [place] Mobile Phone: [phone] Relation: Son  Code Status: Full Code Goals of care:  Advanced Directive information Advanced Directives 05/03/2018  Does Patient Have a Medical Advance Directive? Yes  Type of Advance Directive (No Data)  Does patient want to make changes to medical advance directive? No - Patient declined  Would patient like information on creating a medical advance directive? No - Patient declined     No Known Allergies  Chief Complaint  Patient presents with  . Discharge Note    Discharge Visit    HPI:  84 y.o. female  Seen today for Discharge from the Facility.  Patient has h/o dementia, diabetes mellitus, hypertension,CKD, hyperlipidemia  She was admitted to SNF for rehab after hospitalization 06/24-07/01 for left femur fracture sustained in a fall.  She underwent left intramedullary nail placement on 06/26.  Her postop course was complicated with urinary retention.  When she came to the facility she had a chronic Foley which was eventually removed Because of her anxiety with her dementia she was started on trial of  BuSpar. She was also started on Lantus Insulin with Novolog Bolus as her BS were persistently more then 300. Beside her impaired cognition and anxiety which was controlled with BuSpar.  Patient did well.  She is now walking with a walker but with minimum assist.  She continues to need cues for her ADLs.  Her family is planning to take her home with  24-hour care.   Past Medical History:  Diagnosis Date  . Chronic kidney disease    stage II  . Dementia   . Diabetes mellitus   . Hyperlipidemia   . Hypertension     Past Surgical History:  Procedure Laterality Date  . ABDOMINAL HYSTERECTOMY    . BACK SURGERY    . INTRAMEDULLARY (IM) NAIL INTERTROCHANTERIC Left 03/28/2018   Procedure: INTRAMEDULLARY (IM) NAIL INTERTROCHANTRIC;  Surgeon: Carole Civil, MD;  Location: [facility];  Service: Orthopedics;  Laterality: Left;      reports that she has never smoked. She has never used smokeless tobacco. She reports that she does not drink alcohol or use drugs. Social History   Socioeconomic History  . Marital status: Widowed    Spouse name: Not on file  . Number of children: Not on file  . Years of education: Not on file  . Highest education level: Not on file  Occupational History  . Not on file  Social Needs  . Financial resource strain: Not on file  . Food insecurity:    Worry: Not on file    Inability: Not on file  . Transportation needs:    Medical: Not on file    Non-medical: Not on file  Tobacco Use  . Smoking status: Never Smoker  . Smokeless tobacco: Never Used  Substance and Sexual Activity  . Alcohol use: No  . Drug use: No  . Sexual activity: Not Currently  Lifestyle  . Physical activity:    Days per week: Not on file  Minutes per session: Not on file  . Stress: Not on file  Relationships  . Social connections:    Talks on phone: Not on file    Gets together: Not on file    Attends religious service: Not on file    Active member of club or organization: Not on file    Attends meetings of clubs or organizations: Not on file    Relationship status: Not on file  . Intimate partner violence:    Fear of current or ex partner: Not on file    Emotionally abused: Not on file    Physically abused: Not on file    Forced sexual activity: Not on file  Other Topics Concern  . Not on file  Social History  Narrative  . Not on file   Functional Status Survey:    No Known Allergies  Pertinent  Health Maintenance Due  Topic Date Due  . OPHTHALMOLOGY EXAM  05/04/2018 (Originally 01/14/2016)  . URINE MICROALBUMIN  05/04/2018 (Originally 09/03/2016)  . DEXA SCAN  05/04/2018 (Originally 10/24/1997)  . INFLUENZA VACCINE  06/03/2018 (Originally 05/03/2018)  . HEMOGLOBIN A1C  06/03/2018 (Originally 04/26/2018)  . FOOT EXAM  10/27/2018  . PNA vac Low Risk Adult  Completed    Medications: Outpatient Encounter Medications as of 05/03/2018  Medication Sig  . acetaminophen (TYLENOL) 325 MG tablet Take 2 tablets (650 mg total) by mouth every 6 (six) hours as needed for mild pain (or Fever >/= 101).  Marland Kitchen ALPRAZolam (XANAX) 0.25 MG tablet Take 1 tablet (0.25 mg total) by mouth at bedtime as needed.  . busPIRone (BUSPAR) 5 MG tablet Take 5 mg by mouth 2 (two) times daily.  . ferrous sulfate (QC FERROUS SULFATE) 325 (65 FE) MG tablet Take 325 mg by mouth daily with breakfast.  . glucose blood test strip Use as instructed for once daily testing  With Easymax meter  . Insulin Glargine (LANTUS SOLOSTAR) 100 UNIT/ML Solostar Pen Inject 8 Units into the skin at bedtime.   . insulin lispro (HUMALOG KWIKPEN) 100 UNIT/ML KiwkPen Inject 8 Units into the skin daily.   . Lancets MISC Use as directed to monitor FSBS 2x daily. Dx: E11.9.  . metoprolol tartrate (LOPRESSOR) 25 MG tablet take 1 tablet by mouth twice a day with food for high blood pressure  . NAMZARIC 28-10 MG CP24 TAKE 1 CAPSULE BY MOUTH ONCE DAILY(BEGIN AFTER TITRATION PACK IS COMPLETED  . phenol (CHLORASEPTIC) 1.4 % LIQD Use as directed 1 spray in the mouth or throat as needed for throat irritation / pain.  . rosuvastatin (CRESTOR) 20 MG tablet take 1 tablet by mouth at bedtime FOR CHOLESTEROL  . [DISCONTINUED] aspirin EC 325 MG EC tablet Take 1 tablet (325 mg total) by mouth daily with breakfast.   No facility-administered encounter medications on file as  of 05/03/2018.      Review of Systems  Vitals:   05/03/18 1140  BP: 137/61  Pulse: 71  Resp: 20  Temp: 98.4 F (36.9 C)  TempSrc: Oral   There is no height or weight on file to calculate BMI. Physical Exam  Constitutional: She appears well-developed.  HENT:  Head: Normocephalic.  Mouth/Throat: Oropharynx is clear and moist.  Eyes: Pupils are equal, round, and reactive to light.  Neck: Neck supple.  Cardiovascular: Normal rate and regular rhythm.  Pulmonary/Chest: Effort normal and breath sounds normal. No stridor. No respiratory distress.  Abdominal: Soft. Bowel sounds are normal. She exhibits no distension. There is no  tenderness. There is no guarding.  Musculoskeletal: She exhibits no edema.  Some swelling around Left Ankle  Neurological: She is alert.  Not oriented to place or time Follows simple Commands and has no focal Deficits  Skin: Skin is warm and dry.  Psychiatric: She has a normal mood and affect. Her behavior is normal. Thought content normal.    Labs reviewed: Basic Metabolic Panel: Recent Labs    04/03/18 0400 04/06/18 0854 04/24/18 0700  NA 140 140 141  K 4.1 4.2 4.1  CL 110 104 108  CO2 23 26 28   GLUCOSE 149* 228* 129*  BUN 28* 32* 32*  CREATININE 1.33* 1.23* 1.26*  CALCIUM 8.2* 8.3* 8.5*   Liver Function Tests: Recent Labs    10/27/17 0828  AST 15  ALT 8  BILITOT 0.3  PROT 6.4   No results for input(s): LIPASE, AMYLASE in the last 8760 hours. No results for input(s): AMMONIA in the last 8760 hours. CBC: Recent Labs    04/03/18 0400 04/06/18 0854 04/24/18 0700  WBC 8.7 10.0 7.0  NEUTROABS 6.2 7.0 4.6  HGB 8.5* 9.0* 9.4*  HCT 25.9* 27.7* 30.1*  MCV 88.7 89.4 90.1  PLT 184 291 265   Cardiac Enzymes: Recent Labs    03/26/18 2059 03/30/18 0423  CKTOTAL 274* 602*   BNP: Invalid input(s): POCBNP CBG: Recent Labs    04/02/18 0725 04/02/18 1102 04/02/18 1634  GLUCAP 105* 195* 112*    Procedures and Imaging Studies  During Stay: Dg Tibia/fibula Left  Result Date: 04/12/2018 CLINICAL DATA:  Left leg and hip pain with swelling. EXAM: LEFT TIBIA AND FIBULA - 2 VIEW COMPARISON:  Knee radiographs from 08/06/2010 FINDINGS: Moderate spurring of the tibial spine and medial compartmental margin noted. Atherosclerotic vascular calcifications are present. Marginal spurring of the patella along with quadriceps spurring noted. No appreciable knee effusion. Stranding in the subcutaneous adipose tissues suggest subcutaneous edema especially laterally and in the lower calf. IMPRESSION: 1. No acute bony findings. 2. Subcutaneous edema in the calf. 3. Atherosclerosis. 4. Osteoarthritis of the knee. Electronically Signed   By: Van Clines M.D.   On: 04/12/2018 08:59   Dg Ankle Complete Left  Result Date: 04/12/2018 CLINICAL DATA:  Left leg pain and swelling. EXAM: LEFT ANKLE COMPLETE - 3+ VIEW COMPARISON:  None. FINDINGS: Notable subcutaneous edema overlying the malleoli. There is a lesser degree of subcutaneous edema in the distal calf. Soft tissue swelling is also present below the malleoli and along the heel. Large Achilles calcaneal spur with smaller plantar calcaneal spur. No fracture or acute bony findings. IMPRESSION: 1. Soft tissue swelling along the ankle, heel, and lower calf. No underlying bony defect identified. 2. Calcaneal spurring. Electronically Signed   By: Van Clines M.D.   On: 04/12/2018 09:04   US Venous Img Lower Unilateral Left  Result Date: 04/11/2018 CLINICAL DATA:  84 year old female with a history of left lower extremity edema EXAM: LEFT LOWER EXTREMITY VENOUS DOPPLER ULTRASOUND TECHNIQUE: Gray-scale sonography with graded compression, as well as color Doppler and duplex ultrasound were performed to evaluate the lower extremity deep venous systems from the level of the common femoral vein and including the common femoral, femoral, profunda femoral, popliteal and calf veins including the  posterior tibial, peroneal and gastrocnemius veins when visible. The superficial great saphenous vein was also interrogated. Spectral Doppler was utilized to evaluate flow at rest and with distal augmentation maneuvers in the common femoral, femoral and popliteal veins. COMPARISON:  None. FINDINGS: Contralateral  Common Femoral Vein: Respiratory phasicity is normal and symmetric with the symptomatic side. No evidence of thrombus. Normal compressibility. Common Femoral Vein: No evidence of thrombus. Normal compressibility, respiratory phasicity and response to augmentation. Saphenofemoral Junction: No evidence of thrombus. Normal compressibility and flow on color Doppler imaging. Profunda Femoral Vein: No evidence of thrombus. Normal compressibility and flow on color Doppler imaging. Femoral Vein: No evidence of thrombus. Normal compressibility, respiratory phasicity and response to augmentation. Popliteal Vein: No evidence of thrombus. Normal compressibility, respiratory phasicity and response to augmentation. Calf Veins: No evidence of thrombus. Normal compressibility and flow on color Doppler imaging. Superficial Great Saphenous Vein: No evidence of thrombus. Normal compressibility. Venous Reflux:  None. Other Findings:  None. IMPRESSION: No evidence of deep venous thrombosis. Electronically Signed   By: Jacqulynn Cadet M.D.   On: 04/11/2018 17:32   Dg Hip Unilat With Pelvis 2-3 Views Left  Result Date: 04/12/2018 CLINICAL DATA:  Left hip and lower leg pain. EXAM: DG HIP (WITH OR WITHOUT PELVIS) 2-3V LEFT COMPARISON:  03/26/2018 FINDINGS: Left hip intertrochanteric nail with good alignment across the intertrochanteric fracture site. Displaced lesser trochanteric fragment unchanged. Lower lumbar posterolateral rod and pedicle screw fixators. Bony demineralization. Vascular calcifications. IMPRESSION: 1. Left hip intertrochanteric nail with good alignment across the intertrochanteric fracture site. No acute  findings. 2. Bony demineralization. 3. Vascular calcifications. 4. Incidental note made of displaced lesser trochanteric fragment (unchanged). Electronically Signed   By: Van Clines M.D.   On: 04/12/2018 09:03   Dg Hip Unilat With Pelvis 2-3 Views Left  Result Date: 04/10/2018 [facility] report Dictated by Dr. [name] Chief complaint hip fracture repair, left hip gamma nail Short gamma nail was seen in the left hip.  Compared to prior preop films of fracture has impacted.  The Intra-Op films show a fracture gap is closed with skin appropriate sliding of the nail Impression fracture is healing appropriately with good position of hardware in the center of the femoral head    Assessment/Plan:    Essential hypertension Blood pressure stable On metoprolol twice a day Her losartan and HCTZ was discontinued in the hospital due to acute kidney dysfunction  Type 2 diabetes mellitus  Patient started on Lantus Glipizide was discontinued Her blood sugars during lunchtime are still more than 250 Continues to get boluses with lunch Her family has learned How to inject her with insulin They will discuss with PCP to see if she can be transitioned to hypoglycemics  Vascular dementia with behavior disturbance Patient has done well on BuSpar Will discontinue Xanax Continue Namzaric CKD (chronic kidney disease), stage III (HCC) Creatinine stable   S/P ORIF (open reduction internal fixation) fracture IM nail left femur 03/28/18 Pain controlled on Tylenol Will be discharged home with home health care and 24 hour Care Is able to walk with a walker but with assist. Continue to need help with ADLs due to her cognitive impairment   Left Ankle Swelling Xray and Dopplers were Negative Anemia Hgb near Baseline On Iron  Need follow up with PCP  Hyperlipidemia Patient on statin  Follow up with PCP  Anxiety Discharge on Buspar Can consider taper if she improves in  home .Consider SSRI if her symptoms worsen  Future labs/tests needed:    Discharge time more then 30 min

## 2018-05-04 ENCOUNTER — Encounter (HOSPITAL_COMMUNITY)
Admission: RE | Admit: 2018-05-04 | Discharge: 2018-05-04 | Disposition: A | Discharge status: Home / Self Care | Payer: Medicare HMO | Source: Skilled Nursing Facility | Attending: Internal Medicine | Admitting: Internal Medicine

## 2018-05-04 DIAGNOSIS — Z9181 History of falling: Secondary | ICD-10-CM | POA: Diagnosis not present

## 2018-05-04 DIAGNOSIS — S72142D Displaced intertrochanteric fracture of left femur, subsequent encounter for closed fracture with routine healing: Secondary | ICD-10-CM | POA: Diagnosis not present

## 2018-05-04 DIAGNOSIS — Z4789 Encounter for other orthopedic aftercare: Secondary | ICD-10-CM | POA: Insufficient documentation

## 2018-05-04 LAB — CBC
HEMATOCRIT: 32.4 % — AB (ref 36.0–46.0)
Hemoglobin: 10 g/dL — ABNORMAL LOW (ref 12.0–15.0)
MCH: 27.9 pg (ref 26.0–34.0)
MCHC: 30.9 g/dL (ref 30.0–36.0)
MCV: 90.5 fL (ref 78.0–100.0)
PLATELETS: 243 10*3/uL (ref 150–400)
RBC: 3.58 MIL/uL — ABNORMAL LOW (ref 3.87–5.11)
RDW: 14.4 % (ref 11.5–15.5)
WBC: 6.8 10*3/uL (ref 4.0–10.5)

## 2018-05-04 LAB — BASIC METABOLIC PANEL
Anion gap: 8 (ref 5–15)
BUN: 33 mg/dL — AB (ref 8–23)
CHLORIDE: 108 mmol/L (ref 98–111)
CO2: 27 mmol/L (ref 22–32)
CREATININE: 1.36 mg/dL — AB (ref 0.44–1.00)
Calcium: 8.8 mg/dL — ABNORMAL LOW (ref 8.9–10.3)
GFR, EST AFRICAN AMERICAN: 40 mL/min — AB (ref >60)
GFR, EST NON AFRICAN AMERICAN: 34 mL/min — AB (ref >60)
Glucose, Bld: 117 mg/dL — ABNORMAL HIGH (ref 70–99)
POTASSIUM: 4 mmol/L (ref 3.5–5.1)
SODIUM: 143 mmol/L (ref 135–145)

## 2018-05-07 ENCOUNTER — Telehealth: Payer: Self-pay | Admitting: *Deleted

## 2018-05-07 DIAGNOSIS — E785 Hyperlipidemia, unspecified: Secondary | ICD-10-CM | POA: Diagnosis not present

## 2018-05-07 DIAGNOSIS — N183 Chronic kidney disease, stage 3 (moderate): Secondary | ICD-10-CM | POA: Diagnosis not present

## 2018-05-07 DIAGNOSIS — Z794 Long term (current) use of insulin: Secondary | ICD-10-CM | POA: Diagnosis not present

## 2018-05-07 DIAGNOSIS — I129 Hypertensive chronic kidney disease with stage 1 through stage 4 chronic kidney disease, or unspecified chronic kidney disease: Secondary | ICD-10-CM | POA: Diagnosis not present

## 2018-05-07 DIAGNOSIS — R69 Illness, unspecified: Secondary | ICD-10-CM | POA: Diagnosis not present

## 2018-05-07 DIAGNOSIS — E1129 Type 2 diabetes mellitus with other diabetic kidney complication: Secondary | ICD-10-CM | POA: Diagnosis not present

## 2018-05-07 DIAGNOSIS — S72142D Displaced intertrochanteric fracture of left femur, subsequent encounter for closed fracture with routine healing: Secondary | ICD-10-CM | POA: Diagnosis not present

## 2018-05-07 DIAGNOSIS — R339 Retention of urine, unspecified: Secondary | ICD-10-CM | POA: Diagnosis not present

## 2018-05-07 DIAGNOSIS — Z9181 History of falling: Secondary | ICD-10-CM | POA: Diagnosis not present

## 2018-05-07 NOTE — Telephone Encounter (Signed)
Agree with above 

## 2018-05-07 NOTE — Telephone Encounter (Signed)
Received call from Steffanie Dunn Shiloh with [facility] ([phone]) [phone]~ telephone.   Reports that patient has been discharged form [facility] SNF to home and requested VO to have HH SN, HH PT and HH OT eval and treat as indicated. States that patient will be seen by [facility] SN 1x weekly x3 weeks for post hospitalization follow up.   VO given.

## 2018-05-14 ENCOUNTER — Telehealth: Payer: Self-pay | Admitting: *Deleted

## 2018-05-14 DIAGNOSIS — Z9181 History of falling: Secondary | ICD-10-CM | POA: Diagnosis not present

## 2018-05-14 DIAGNOSIS — I129 Hypertensive chronic kidney disease with stage 1 through stage 4 chronic kidney disease, or unspecified chronic kidney disease: Secondary | ICD-10-CM | POA: Diagnosis not present

## 2018-05-14 DIAGNOSIS — E785 Hyperlipidemia, unspecified: Secondary | ICD-10-CM | POA: Diagnosis not present

## 2018-05-14 DIAGNOSIS — Z794 Long term (current) use of insulin: Secondary | ICD-10-CM | POA: Diagnosis not present

## 2018-05-14 DIAGNOSIS — S72142D Displaced intertrochanteric fracture of left femur, subsequent encounter for closed fracture with routine healing: Secondary | ICD-10-CM | POA: Diagnosis not present

## 2018-05-14 DIAGNOSIS — R69 Illness, unspecified: Secondary | ICD-10-CM | POA: Diagnosis not present

## 2018-05-14 DIAGNOSIS — E1129 Type 2 diabetes mellitus with other diabetic kidney complication: Secondary | ICD-10-CM | POA: Diagnosis not present

## 2018-05-14 DIAGNOSIS — R339 Retention of urine, unspecified: Secondary | ICD-10-CM | POA: Diagnosis not present

## 2018-05-14 DIAGNOSIS — N183 Chronic kidney disease, stage 3 (moderate): Secondary | ICD-10-CM | POA: Diagnosis not present

## 2018-05-14 NOTE — Telephone Encounter (Signed)
noted 

## 2018-05-14 NOTE — Telephone Encounter (Signed)
Received call from Amy, [facility] PT with [place] [phone]~ telephone.   Requested VO to extend [facility] PT 2x weekly x4 weeks for strengthening. VO given.

## 2018-05-16 ENCOUNTER — Other Ambulatory Visit: Payer: Self-pay | Admitting: Family Medicine

## 2018-05-18 ENCOUNTER — Ambulatory Visit (INDEPENDENT_AMBULATORY_CARE_PROVIDER_SITE_OTHER): Payer: Medicare HMO | Admitting: Family Medicine

## 2018-05-18 ENCOUNTER — Encounter: Payer: Self-pay | Admitting: Family Medicine

## 2018-05-18 ENCOUNTER — Other Ambulatory Visit: Payer: Self-pay

## 2018-05-18 VITALS — BP 148/68 | HR 66 | Temp 98.2°F | Resp 14 | Ht 61.0 in | Wt 121.0 lb

## 2018-05-18 DIAGNOSIS — E441 Mild protein-calorie malnutrition: Secondary | ICD-10-CM

## 2018-05-18 DIAGNOSIS — D649 Anemia, unspecified: Secondary | ICD-10-CM

## 2018-05-18 DIAGNOSIS — F0151 Vascular dementia with behavioral disturbance: Secondary | ICD-10-CM

## 2018-05-18 DIAGNOSIS — N183 Chronic kidney disease, stage 3 unspecified: Secondary | ICD-10-CM

## 2018-05-18 DIAGNOSIS — Z111 Encounter for screening for respiratory tuberculosis: Secondary | ICD-10-CM

## 2018-05-18 DIAGNOSIS — E1122 Type 2 diabetes mellitus with diabetic chronic kidney disease: Secondary | ICD-10-CM | POA: Diagnosis not present

## 2018-05-18 DIAGNOSIS — F01518 Vascular dementia, unspecified severity, with other behavioral disturbance: Secondary | ICD-10-CM

## 2018-05-18 DIAGNOSIS — R69 Illness, unspecified: Secondary | ICD-10-CM | POA: Diagnosis not present

## 2018-05-18 NOTE — Progress Notes (Signed)
   Subjective:    Patient ID: Betty [name], female    DOB: 11/20/1930, 84 y.o.   MRN: [id number]  Patient presents for Rehab F/U    [facility]- she was discharged on 8/3  Reviewed hospital and SNF discharge/labs    She was admitted to rehab after a fall resulting in left femur fracture. She had nail intrameduallary. Gets pain when getting into the bed, otherwise not in much pain Using walker at all times  She is off narcotic pain  DM- given new meter- needs orders from pharmacy, checking three times a day  Fasting 90-165,  Mid day 150-290  Highest which was outlier 430, Evening 123-247  - LAST a1c  7.9%  Humalog 8 units at lunch, Lantus 8 units at bedtime   Anemia- medications reviewed, has not started iron yet, had post operative anemia     Dementia- sundowning has improved , on namzeric and xanax  Needs handicap placard  Needs form completed so she can attend [facility]   Daughter Betty [name] lives in [place] will be up monthly for 2 days to  Help siblings  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, art Will send [employer] paperwork  Review Of Systems:  GEN- denies fatigue, fever, weight loss,weakness, recent illness HEENT- denies eye drainage, change in vision, nasal discharge, CVS- denies chest pain, palpitations RESP- denies SOB, cough, wheeze ABD- denies N/V, change in stools, abd pain GU- denies dysuria, hematuria, dribbling, incontinence MSK- + joint pain, muscle aches, injury Neuro- denies headache, dizziness, syncope, seizure activity       Objective:    BP (!) 148/68   Pulse 66   Temp 98.2 F (36.8 C) (Oral)   Resp 14   Ht 5\' 1"  (1.549 m)   Wt 121 lb (54.9 kg)   SpO2 98%   BMI 22.86 kg/m  GEN- NAD, alert and oriented x person/place HEENT- PERRL, EOMI, non injected sclera, pink conjunctiva, MMM, oropharynx clear Neck- Supple, no thyromegaly CVS- RRR, 2/6  murmur RESP-CTAB ABD-NABS,soft,NT,ND EXT- No edema Pulses- Radial, DP- 2+         Assessment & Plan:      Problem List Items Addressed This Visit      Unprioritized   CKD (chronic kidney disease), stage III ([place])    Near baseline no changes, keep hydrated      Relevant Orders   Basic metabolic panel (Completed)   Dementia - Primary    Requires 24 hour assistance Children to assist her  Appetite increasing       Mild protein malnutrition (HCC)    Switch to glucerna Less sugar in beverages Cut down on fruit She is eating a lot every couple of hours per daughter      Postoperative anemia    Start iron tab once a day if causing constipation, three times a week      Relevant Orders   CBC with Differential/Platelet (Completed)   Type II diabetes mellitus with renal manifestations ([place])   Relevant Orders   Hemoglobin A1c (Completed)   CBC with Differential/Platelet (Completed)    Other Visit Diagnoses    Screening-pulmonary TB       Relevant Orders   QuantiFERON-TB Gold Plus (Completed)      Note: This dictation was prepared with Dragon dictation along with smaller phrase technology. Any transcriptional errors that result from this process are unintentional.

## 2018-05-18 NOTE — Patient Instructions (Addendum)
For the iron tablets  Start once a day, if constipated give three times a week  Glucerna once a day , give more protein Humalog to be changed to Novolog  Bedside commode to be ordered  [place] form to be done  F/U 4 months

## 2018-05-19 LAB — CBC WITH DIFFERENTIAL/PLATELET
BASOS ABS: 7 {cells}/uL (ref 0–200)
Basophils Relative: 0.1 %
EOS PCT: 0.6 %
Eosinophils Absolute: 42 cells/uL (ref 15–500)
HCT: 30.6 % — ABNORMAL LOW (ref 35.0–45.0)
Hemoglobin: 10 g/dL — ABNORMAL LOW (ref 11.7–15.5)
LYMPHS ABS: 1813 {cells}/uL (ref 850–3900)
MCH: 28.3 pg (ref 27.0–33.0)
MCHC: 32.7 g/dL (ref 32.0–36.0)
MCV: 86.7 fL (ref 80.0–100.0)
MONOS PCT: 10.7 %
MPV: 12 fL (ref 7.5–12.5)
Neutro Abs: 4389 cells/uL (ref 1500–7800)
Neutrophils Relative %: 62.7 %
PLATELETS: 202 10*3/uL (ref 140–400)
RBC: 3.53 10*6/uL — ABNORMAL LOW (ref 3.80–5.10)
RDW: 12.6 % (ref 11.0–15.0)
TOTAL LYMPHOCYTE: 25.9 %
WBC: 7 10*3/uL (ref 3.8–10.8)
WBCMIX: 749 {cells}/uL (ref 200–950)

## 2018-05-19 LAB — BASIC METABOLIC PANEL
BUN/Creatinine Ratio: 30 (calc) — ABNORMAL HIGH (ref 6–22)
BUN: 42 mg/dL — AB (ref 7–25)
CALCIUM: 8.5 mg/dL — AB (ref 8.6–10.4)
CHLORIDE: 104 mmol/L (ref 98–110)
CO2: 25 mmol/L (ref 20–32)
Creat: 1.38 mg/dL — ABNORMAL HIGH (ref 0.60–0.88)
Glucose, Bld: 147 mg/dL — ABNORMAL HIGH (ref 65–99)
Potassium: 4 mmol/L (ref 3.5–5.3)
SODIUM: 139 mmol/L (ref 135–146)

## 2018-05-19 LAB — HEMOGLOBIN A1C
Hgb A1c MFr Bld: 7.1 % of total Hgb — ABNORMAL HIGH (ref <5.7)
Mean Plasma Glucose: 157 (calc)
eAG (mmol/L): 8.7 (calc)

## 2018-05-20 ENCOUNTER — Encounter: Payer: Self-pay | Admitting: Family Medicine

## 2018-05-20 LAB — QUANTIFERON-TB GOLD PLUS
NIL: 0.04 IU/mL
QuantiFERON-TB Gold Plus: NEGATIVE
TB1-NIL: 0 IU/mL
TB2-NIL: 0.01 IU/mL

## 2018-05-20 NOTE — Assessment & Plan Note (Signed)
Switch to glucerna Less sugar in beverages Cut down on fruit She is eating a lot every couple of hours per daughter

## 2018-05-20 NOTE — Assessment & Plan Note (Signed)
Start iron tab once a day if causing constipation, three times a week

## 2018-05-20 NOTE — Assessment & Plan Note (Signed)
Requires 24 hour assistance Children to assist her  Appetite increasing

## 2018-05-20 NOTE — Assessment & Plan Note (Signed)
Near baseline no changes, keep hydrated

## 2018-05-21 ENCOUNTER — Ambulatory Visit (INDEPENDENT_AMBULATORY_CARE_PROVIDER_SITE_OTHER): Payer: Medicare HMO

## 2018-05-21 ENCOUNTER — Encounter: Payer: Self-pay | Admitting: Orthopedic Surgery

## 2018-05-21 ENCOUNTER — Ambulatory Visit (INDEPENDENT_AMBULATORY_CARE_PROVIDER_SITE_OTHER): Payer: Medicare HMO | Admitting: Orthopedic Surgery

## 2018-05-21 VITALS — BP 134/50 | HR 63 | Ht 61.0 in | Wt 119.0 lb

## 2018-05-21 DIAGNOSIS — Z967 Presence of other bone and tendon implants: Secondary | ICD-10-CM

## 2018-05-21 DIAGNOSIS — Z9889 Other specified postprocedural states: Secondary | ICD-10-CM

## 2018-05-21 DIAGNOSIS — M6281 Muscle weakness (generalized): Secondary | ICD-10-CM | POA: Diagnosis not present

## 2018-05-21 DIAGNOSIS — Z8781 Personal history of (healed) traumatic fracture: Secondary | ICD-10-CM | POA: Diagnosis not present

## 2018-05-21 DIAGNOSIS — R69 Illness, unspecified: Secondary | ICD-10-CM | POA: Diagnosis not present

## 2018-05-21 NOTE — Progress Notes (Signed)
Chief Complaint  Patient presents with  . Hip Injury    Left hip  DOS:03/28/18    Left inotropic fracture gamma nail fixation postop week 6  No complaints from the patient she is ambulatory with a walker she has excellent get up and go test  Her x-ray shows that the fracture is healing she did have a lot of sliding of her screw which is expected.  Clinically so far no problems from that  Leg lengths are equal rotation alignment is good  X-ray again in 6 weeks  . Encounter Diagnosis  Name Primary?  . S/P ORIF (open reduction internal fixation) fracture IM nail left femur 03/28/18 Yes

## 2018-05-22 ENCOUNTER — Encounter: Payer: Self-pay | Admitting: *Deleted

## 2018-05-23 ENCOUNTER — Telehealth: Payer: Self-pay | Admitting: *Deleted

## 2018-05-23 NOTE — Telephone Encounter (Signed)
Received call from patient daughter Tammie.   Inquired as to clarification on medications. Reports that patient is taking Buspar BID,and Xanax QHS. Inquired as to if she is supposed to take both medications at bedtime, or should she take Buspar around dinner.  MD please advise.

## 2018-05-23 NOTE — Telephone Encounter (Signed)
Okay to give buspar around dinner Give xanax at bedtime

## 2018-05-24 NOTE — Telephone Encounter (Signed)
Call placed to patient and patient daughter Betty [name] made aware.

## 2018-05-29 ENCOUNTER — Telehealth: Payer: Self-pay | Admitting: Family Medicine

## 2018-05-29 MED ORDER — INSULIN LISPRO 100 UNIT/ML (KWIKPEN): 8.0000 [IU] | PEN_INJECTOR | Freq: Every day | SUBCUTANEOUS | 3 refills | Status: DC

## 2018-05-29 MED ORDER — INSULIN GLARGINE 100 UNIT/ML SOLOSTAR PEN: 8.0000 [IU] | PEN_INJECTOR | Freq: Every day | SUBCUTANEOUS | 3 refills | Status: DC

## 2018-05-29 NOTE — Telephone Encounter (Signed)
Pt needs refill on hemalog kwikpen and lantus solostar pens. To [facility] [address] [facility] Pao will be out by Monday next week .

## 2018-05-29 NOTE — Telephone Encounter (Signed)
Prescription sent to pharmacy.

## 2018-05-30 ENCOUNTER — Telehealth: Payer: Self-pay | Admitting: *Deleted

## 2018-05-30 MED ORDER — INSULIN ASPART 100 UNIT/ML FLEXPEN: 8.0000 [IU] | PEN_INJECTOR | Freq: Every day | SUBCUTANEOUS | 11 refills | Status: DC

## 2018-05-30 NOTE — Telephone Encounter (Signed)
Received fax requesting alternative to Humalog. Humalog is no longer covered by insurance.   Medication changed to Novolog.

## 2018-05-31 ENCOUNTER — Telehealth: Payer: Self-pay | Admitting: Family Medicine

## 2018-05-31 NOTE — Telephone Encounter (Signed)
Awaiting forms

## 2018-05-31 NOTE — Telephone Encounter (Signed)
[name] dropped off for Betty [name] daughter for intermittent fmla for care of mother, placed into yellow folder.

## 2018-06-01 ENCOUNTER — Other Ambulatory Visit: Payer: Self-pay | Admitting: *Deleted

## 2018-06-01 MED ORDER — GLUCOSE BLOOD VI STRP: ORAL_STRIP | 11 refills | Status: DC

## 2018-06-01 MED ORDER — LANCETS MISC: 11 refills | Status: DC

## 2018-06-01 MED ORDER — BLOOD GLUCOSE SYSTEM PAK KIT: PACK | 1 refills | Status: DC

## 2018-06-01 NOTE — Telephone Encounter (Signed)
Received fax from [name] ([place]) for Betty [name] forms for [name].    Betty [name], patient daughter states that discussion was had with PCP in regards to [name] coming to care for mother to give Betty some time off since she is the primary caregiver.   Reason [name] requested: intermittent leave for caring for mother.   Verbalized that fee may be charged and is per provider prerogative.   Forms routed to provider.

## 2018-06-08 ENCOUNTER — Other Ambulatory Visit: Payer: Self-pay | Admitting: Family Medicine

## 2018-06-18 ENCOUNTER — Telehealth: Payer: Self-pay | Admitting: *Deleted

## 2018-06-18 MED ORDER — INSULIN GLARGINE 100 UNIT/ML SOLOSTAR PEN: 8.0000 [IU] | PEN_INJECTOR | Freq: Every day | SUBCUTANEOUS | 3 refills | Status: DC

## 2018-06-18 NOTE — Telephone Encounter (Signed)
Received request from pharmacy for PA on Lantus Solostar.   PA submitted.   Dx:E11.9- DM, type 2.  Received immediate approval.   PA Case [id number] Approved 10/01/2017- 10/02/2018.

## 2018-06-20 ENCOUNTER — Other Ambulatory Visit: Payer: Self-pay | Admitting: Family Medicine

## 2018-06-20 DIAGNOSIS — R69 Illness, unspecified: Secondary | ICD-10-CM | POA: Diagnosis not present

## 2018-06-20 MED ORDER — LANCETS MISC: 11 refills | Status: DC

## 2018-06-20 MED ORDER — INSULIN PEN NEEDLE 32G X 6 MM MISC: 1 refills | Status: DC

## 2018-06-20 MED ORDER — ALPRAZOLAM 0.25 MG PO TABS: 0.2500 mg | ORAL_TABLET | Freq: Every evening | ORAL | 0 refills | Status: DC | PRN

## 2018-06-20 MED ORDER — BLOOD GLUCOSE SYSTEM PAK KIT: PACK | 1 refills | Status: DC

## 2018-06-20 MED ORDER — BUSPIRONE HCL 5 MG PO TABS: 5.0000 mg | ORAL_TABLET | Freq: Two times a day (BID) | ORAL | 3 refills | Status: DC

## 2018-06-20 MED ORDER — GLUCOSE BLOOD VI STRP: ORAL_STRIP | 11 refills | Status: DC

## 2018-06-20 MED ORDER — HYDROCHLOROTHIAZIDE 12.5 MG PO TABS: 12.5000 mg | ORAL_TABLET | Freq: Every day | ORAL | 6 refills | Status: DC

## 2018-06-20 NOTE — Telephone Encounter (Signed)
Patients daughter calling to get refill on her hctz buspar and xanax  If possible  Please send to [facility] [address]  Also would like all diabetic supplies sent as well, but tammy daughter has questions regarding these supplies before getting them filled  Please call tammy at [phone]

## 2018-06-20 NOTE — Telephone Encounter (Signed)
Call placed to patient family. Reports that [facility] hs not been able to fill DM supplies. Call placed to pharmacy. Was advised that new prescriptions and chart notes are required. Advised that prescriptions and chart notes have been sent to pharmacy. Re-sent all requested information.   Refills appropriate for routine medications and filled per protocol.   Ok to refill Xanax?? Last office visit 05/18/2018. Last refill 04/03/2018.

## 2018-06-21 ENCOUNTER — Other Ambulatory Visit: Payer: Self-pay | Admitting: *Deleted

## 2018-06-21 MED ORDER — BLOOD GLUCOSE SYSTEM PAK KIT: PACK | 1 refills | Status: DC

## 2018-06-21 MED ORDER — GLUCOSE BLOOD VI STRP: ORAL_STRIP | 11 refills | Status: DC

## 2018-07-02 ENCOUNTER — Ambulatory Visit (INDEPENDENT_AMBULATORY_CARE_PROVIDER_SITE_OTHER): Payer: Medicare HMO | Admitting: Orthopedic Surgery

## 2018-07-02 ENCOUNTER — Ambulatory Visit (INDEPENDENT_AMBULATORY_CARE_PROVIDER_SITE_OTHER): Payer: Medicare HMO

## 2018-07-02 VITALS — BP 142/74 | HR 63 | Ht 61.0 in | Wt 119.0 lb

## 2018-07-02 DIAGNOSIS — Z9889 Other specified postprocedural states: Secondary | ICD-10-CM

## 2018-07-02 DIAGNOSIS — Z967 Presence of other bone and tendon implants: Secondary | ICD-10-CM | POA: Diagnosis not present

## 2018-07-02 DIAGNOSIS — Z8781 Personal history of (healed) traumatic fracture: Secondary | ICD-10-CM | POA: Diagnosis not present

## 2018-07-02 NOTE — Patient Instructions (Signed)
Please call the office if you are interested in more physical therapy

## 2018-07-02 NOTE — Progress Notes (Signed)
Postop appointment  Chief Complaint  Patient presents with  . Follow-up    Recheck on left hip, DOS 03-28-18.   Postop day #6  Patient does not complain of pain however she still using a walker  Her x-ray shows fracture healing with prominence of the lag screw with appropriate but excessive sliding on fracture healing  Patient would like to consider further therapy they will check with the other family members to see if they need an order but otherwise the fracture is healed she is amatory with a walker without pain  Follow-up as needed  Encounter Diagnosis  Name Primary?  . S/P ORIF (open reduction internal fixation) fracture IM nail left femur 03/28/18 Yes

## 2018-07-14 ENCOUNTER — Other Ambulatory Visit: Payer: Self-pay | Admitting: Family Medicine

## 2018-07-31 ENCOUNTER — Other Ambulatory Visit: Payer: Self-pay | Admitting: Physician Assistant

## 2018-07-31 NOTE — Telephone Encounter (Signed)
Last OV 05/18/2018

## 2018-07-31 NOTE — Telephone Encounter (Signed)
Last ov 05/18/2018 Last refill 06/20/2018 Ok to refill?

## 2018-08-01 DIAGNOSIS — R69 Illness, unspecified: Secondary | ICD-10-CM | POA: Diagnosis not present

## 2018-08-13 ENCOUNTER — Other Ambulatory Visit: Payer: Self-pay | Admitting: Family Medicine

## 2018-08-27 ENCOUNTER — Other Ambulatory Visit: Payer: Self-pay | Admitting: Family Medicine

## 2018-08-31 ENCOUNTER — Other Ambulatory Visit: Payer: Self-pay | Admitting: Family Medicine

## 2018-09-03 NOTE — Telephone Encounter (Signed)
Pt is requesting refill on Xanax   LOV: 05/18/18  LRF:   07/31/18

## 2018-09-06 ENCOUNTER — Telehealth: Payer: Self-pay | Admitting: *Deleted

## 2018-09-06 DIAGNOSIS — R69 Illness, unspecified: Secondary | ICD-10-CM | POA: Diagnosis not present

## 2018-09-06 NOTE — Telephone Encounter (Signed)
Received call from patient daughter.   Reports that she has dental appointment in AM for deep cleaning. Reports that she was advised to take Diazepam tonight and then again in AM prior to appointment.   Inquired as to if she should take with Xanax. Advised to hold Xanax for tonight.   MD to be made aware.

## 2018-09-07 DIAGNOSIS — R69 Illness, unspecified: Secondary | ICD-10-CM | POA: Diagnosis not present

## 2018-09-07 NOTE — Telephone Encounter (Signed)
Agree do not take both xanax and valium She can resume the xanax Saturday

## 2018-09-12 ENCOUNTER — Other Ambulatory Visit: Payer: Self-pay | Admitting: Family Medicine

## 2018-09-19 ENCOUNTER — Ambulatory Visit (INDEPENDENT_AMBULATORY_CARE_PROVIDER_SITE_OTHER): Payer: Medicare HMO | Admitting: Family Medicine

## 2018-09-19 ENCOUNTER — Encounter: Payer: Self-pay | Admitting: Family Medicine

## 2018-09-19 VITALS — BP 128/58 | HR 82 | Temp 98.8°F | Resp 16 | Ht 61.0 in | Wt 134.0 lb

## 2018-09-19 DIAGNOSIS — F01518 Vascular dementia, unspecified severity, with other behavioral disturbance: Secondary | ICD-10-CM

## 2018-09-19 DIAGNOSIS — T50B95A Adverse effect of other viral vaccines, initial encounter: Secondary | ICD-10-CM

## 2018-09-19 DIAGNOSIS — R609 Edema, unspecified: Secondary | ICD-10-CM

## 2018-09-19 DIAGNOSIS — F0151 Vascular dementia with behavioral disturbance: Secondary | ICD-10-CM

## 2018-09-19 DIAGNOSIS — N183 Chronic kidney disease, stage 3 unspecified: Secondary | ICD-10-CM

## 2018-09-19 DIAGNOSIS — Z23 Encounter for immunization: Secondary | ICD-10-CM

## 2018-09-19 DIAGNOSIS — E1122 Type 2 diabetes mellitus with diabetic chronic kidney disease: Secondary | ICD-10-CM | POA: Diagnosis not present

## 2018-09-19 DIAGNOSIS — E782 Mixed hyperlipidemia: Secondary | ICD-10-CM

## 2018-09-19 DIAGNOSIS — I10 Essential (primary) hypertension: Secondary | ICD-10-CM | POA: Diagnosis not present

## 2018-09-19 DIAGNOSIS — R600 Localized edema: Secondary | ICD-10-CM

## 2018-09-19 DIAGNOSIS — M79672 Pain in left foot: Secondary | ICD-10-CM | POA: Diagnosis not present

## 2018-09-19 DIAGNOSIS — R69 Illness, unspecified: Secondary | ICD-10-CM | POA: Diagnosis not present

## 2018-09-19 MED ORDER — BUSPIRONE HCL 5 MG PO TABS: 5.0000 mg | ORAL_TABLET | Freq: Two times a day (BID) | ORAL | 3 refills | Status: DC

## 2018-09-19 MED ORDER — GLIPIZIDE 5 MG PO TABS: 5.0000 mg | ORAL_TABLET | Freq: Two times a day (BID) | ORAL | 3 refills | Status: DC

## 2018-09-19 MED ORDER — DIPHENHYDRAMINE HCL 50 MG/ML IJ SOLN
25.0000 mg | Freq: Once | INTRAMUSCULAR | Status: AC
  Administered 2018-09-19: 25 mg via INTRAMUSCULAR

## 2018-09-19 MED ORDER — DIPHENHYDRAMINE HCL 50 MG/ML IJ SOLN: 50.0000 mg | Freq: Once | INTRAMUSCULAR | Status: DC

## 2018-09-19 NOTE — Patient Instructions (Signed)
Get the compression hose Start glipizide 5mg  twice a day Continue Lantus 8 units  Get xray of the foot Buspar sent again as 90 day supply We will fax copy of meds to [facility] F/U 3 months

## 2018-09-19 NOTE — Assessment & Plan Note (Addendum)
Will restart glipizide 5mg  BID COntinue lantus 8 units Keep Novlog on hand, will see how CBG run Trying to reduce injections for patient Weight back up and good appetite Fax copy of meds to [facility]

## 2018-09-19 NOTE — Progress Notes (Signed)
Subjective:    Patient ID: Betty [name], female    DOB: May 01, 1931, 84 y.o.   MRN: [id number]  Patient presents for Follow-up (is not fasting) Pt here to f/u chronic medical problems  Here to follow-up chronic medical problems.  She is here with her daughter.  They are worried about her diabetes he states that her blood sugars now that she is eating much better and is gaining weight have been very high.  She is currently on Lantus 8 units and NovoLog 8 units with her lunch and dinner as it is often only given if greater than 150 units per the instructions.  She was taken off glipizide while she was in the nursing home because she was not eating well and her blood sugars were labile.  They would like to go back on the glipizide as they felt she did better with this.  Her blood sugars have been upwards of 300s at times.  She has had a few low blood sugars in the 40s and 50s but this is been rare.  Her last A1c was  She continues to complain of left foot pain she had a fall after she had her surgery and was placed into the nursing home.  There was never any imaging of her foot.  She often complains near the bottom of the toes.  Daughter is also noted that her left ankle and leg have been swelling since she had her surgery.  She has been released by orthopedics.  She has not been getting enough of the BuSpar to last for 30 days.  Well on her BuSpar and her Xanax at bedtime   Review Of Systems:  GEN- denies fatigue, fever, weight loss,weakness, recent illness HEENT- denies eye drainage, change in vision, nasal discharge, CVS- denies chest pain, palpitations RESP- denies SOB, cough, wheeze ABD- denies N/V, change in stools, abd pain GU- denies dysuria, hematuria, dribbling, incontinence MSK- + joint pain, muscle aches, injury Neuro- denies headache, dizziness, syncope, seizure activity       Objective:    BP (!) 128/58   Pulse 82   Temp 98.8 F (37.1 C) (Oral)   Resp 16   Ht 5'  1" (1.549 m)   Wt 134 lb (60.8 kg)   SpO2 98%   BMI 25.32 kg/m  GEN- NAD, alert and oriented x person/place HEENT- PERRL, EOMI, non injected sclera, pink conjunctiva, MMM, oropharynx clear Neck- Supple, no thyromegaly CVS- RRR, 2/6  murmur RESP-CTAB ABD-NABS,soft,NT,ND EXT- trace left ankle edema, good ROM foot, TTP at metarsal arch, Toes NT, bunion left foot  Skin- in tact flu site area, no erythema, no rash, no flushing Pulses- Radial, DP- 2+        Assessment & Plan:      Problem List Items Addressed This Visit      Unprioritized   CKD (chronic kidney disease), stage III (HCC)   Dementia (HCC)   Relevant Medications   busPIRone (BUSPAR) 5 MG tablet   Essential hypertension, benign    Controlled no changes      Hyperlipidemia   Type II diabetes mellitus with renal manifestations (HCC) - Primary    Will restart glipizide 5mg  BID COntinue lantus 8 units Keep Novlog on hand, will see how CBG run Trying to reduce injections for patient Weight back up and good appetite Fax copy of meds to [place]      Relevant Medications   glipiZIDE (GLUCOTROL) 5 MG tablet   Other Relevant Orders  CBC with Differential/Platelet   Comprehensive metabolic panel   Hemoglobin A1c   Lipid panel    Other Visit Diagnoses    Left foot pain       likely due to pain at arch may have bone spur or neuroma, obtain xray   Relevant Orders   DG Foot Complete Left   Peripheral edema       same side as hip/leg surgery, no red flags, given compression hose   Need for prophylactic vaccination and inoculation against influenza       Relevant Orders   Flu vaccine HIGH DOSE PF (Fluzone High dose) (Completed)   Reaction to influenza immunization, initial encounter       Itching after flu shot, no rash seen, given benadryl in office, no respiratory symptoms   Relevant Medications   diphenhydrAMINE (BENADRYL) injection 25 mg (Completed)      Note: This dictation was prepared with Dragon  dictation along with smaller phrase technology. Any transcriptional errors that result from this process are unintentional.

## 2018-09-19 NOTE — Assessment & Plan Note (Signed)
Controlled no changes 

## 2018-09-20 LAB — COMPREHENSIVE METABOLIC PANEL
AG Ratio: 1.3 (calc) (ref 1.0–2.5)
ALKALINE PHOSPHATASE (APISO): 94 U/L (ref 33–130)
ALT: 14 U/L (ref 6–29)
AST: 18 U/L (ref 10–35)
Albumin: 3.9 g/dL (ref 3.6–5.1)
BUN/Creatinine Ratio: 27 (calc) — ABNORMAL HIGH (ref 6–22)
BUN: 49 mg/dL — ABNORMAL HIGH (ref 7–25)
CALCIUM: 9.2 mg/dL (ref 8.6–10.4)
CO2: 26 mmol/L (ref 20–32)
Chloride: 107 mmol/L (ref 98–110)
Creat: 1.82 mg/dL — ABNORMAL HIGH (ref 0.60–0.88)
Globulin: 3.1 g/dL (calc) (ref 1.9–3.7)
Glucose, Bld: 46 mg/dL — ABNORMAL LOW (ref 65–99)
Potassium: 4 mmol/L (ref 3.5–5.3)
Sodium: 143 mmol/L (ref 135–146)
Total Bilirubin: 0.4 mg/dL (ref 0.2–1.2)
Total Protein: 7 g/dL (ref 6.1–8.1)

## 2018-09-20 LAB — CBC WITH DIFFERENTIAL/PLATELET
Absolute Monocytes: 648 cells/uL (ref 200–950)
Basophils Absolute: 7 cells/uL (ref 0–200)
Basophils Relative: 0.1 %
EOS PCT: 0.6 %
Eosinophils Absolute: 43 cells/uL (ref 15–500)
HCT: 34.5 % — ABNORMAL LOW (ref 35.0–45.0)
Hemoglobin: 11.5 g/dL — ABNORMAL LOW (ref 11.7–15.5)
Lymphs Abs: 1714 cells/uL (ref 850–3900)
MCH: 27.6 pg (ref 27.0–33.0)
MCHC: 33.3 g/dL (ref 32.0–36.0)
MCV: 82.7 fL (ref 80.0–100.0)
MPV: 11.3 fL (ref 7.5–12.5)
Monocytes Relative: 9 %
Neutro Abs: 4788 cells/uL (ref 1500–7800)
Neutrophils Relative %: 66.5 %
PLATELETS: 192 10*3/uL (ref 140–400)
RBC: 4.17 10*6/uL (ref 3.80–5.10)
RDW: 13.5 % (ref 11.0–15.0)
TOTAL LYMPHOCYTE: 23.8 %
WBC: 7.2 10*3/uL (ref 3.8–10.8)

## 2018-09-20 LAB — HEMOGLOBIN A1C
HEMOGLOBIN A1C: 7.7 %{Hb} — AB (ref <5.7)
Mean Plasma Glucose: 174 (calc)
eAG (mmol/L): 9.7 (calc)

## 2018-09-20 LAB — LIPID PANEL
Cholesterol: 218 mg/dL — ABNORMAL HIGH (ref <200)
HDL: 63 mg/dL (ref >50)
LDL Cholesterol (Calc): 121 mg/dL (calc) — ABNORMAL HIGH
Non-HDL Cholesterol (Calc): 155 mg/dL (calc) — ABNORMAL HIGH (ref <130)
Total CHOL/HDL Ratio: 3.5 (calc) (ref <5.0)
Triglycerides: 222 mg/dL — ABNORMAL HIGH (ref <150)

## 2018-09-24 ENCOUNTER — Other Ambulatory Visit: Payer: Self-pay | Admitting: *Deleted

## 2018-09-24 DIAGNOSIS — N183 Chronic kidney disease, stage 3 unspecified: Secondary | ICD-10-CM

## 2018-09-28 ENCOUNTER — Other Ambulatory Visit: Payer: Medicare HMO

## 2018-09-28 DIAGNOSIS — N183 Chronic kidney disease, stage 3 unspecified: Secondary | ICD-10-CM

## 2018-09-28 LAB — BASIC METABOLIC PANEL
BUN/Creatinine Ratio: 23 (calc) — ABNORMAL HIGH (ref 6–22)
BUN: 40 mg/dL — ABNORMAL HIGH (ref 7–25)
CO2: 25 mmol/L (ref 20–32)
Calcium: 9 mg/dL (ref 8.6–10.4)
Chloride: 108 mmol/L (ref 98–110)
Creat: 1.75 mg/dL — ABNORMAL HIGH (ref 0.60–0.88)
GLUCOSE: 232 mg/dL — AB (ref 65–99)
Potassium: 4.5 mmol/L (ref 3.5–5.3)
Sodium: 140 mmol/L (ref 135–146)

## 2018-10-05 ENCOUNTER — Other Ambulatory Visit: Payer: Self-pay | Admitting: Family Medicine

## 2018-10-12 ENCOUNTER — Other Ambulatory Visit: Payer: Self-pay | Admitting: Family Medicine

## 2018-10-18 ENCOUNTER — Other Ambulatory Visit: Payer: Self-pay | Admitting: *Deleted

## 2018-10-18 DIAGNOSIS — R69 Illness, unspecified: Secondary | ICD-10-CM | POA: Diagnosis not present

## 2018-10-18 MED ORDER — BASAGLAR KWIKPEN 100 UNIT/ML ~~LOC~~ SOPN: 8.0000 [IU] | PEN_INJECTOR | Freq: Every day | SUBCUTANEOUS | 3 refills | Status: DC

## 2018-10-26 ENCOUNTER — Telehealth: Payer: Self-pay | Admitting: Family Medicine

## 2018-10-26 NOTE — Telephone Encounter (Signed)
fmla faxed in per clayvon's employer. Placed into yellow folder.

## 2018-10-26 NOTE — Telephone Encounter (Signed)
Awaiting forms

## 2018-10-27 DIAGNOSIS — R69 Illness, unspecified: Secondary | ICD-10-CM | POA: Diagnosis not present

## 2018-10-29 NOTE — Telephone Encounter (Signed)
Received fax for [name] forms.   Call placed to patient for more information.   Reason [name] requested: assist with transportation to MD office  Requested Beginning Date: intermittent  Verbalized that fee may be charged and is per provider prerogative.   Forms routed to provider.

## 2018-11-01 NOTE — Telephone Encounter (Signed)
fmla fee was collected and forms were faxed per susu.

## 2018-11-04 ENCOUNTER — Other Ambulatory Visit: Payer: Self-pay | Admitting: Family Medicine

## 2018-11-05 ENCOUNTER — Other Ambulatory Visit: Payer: Self-pay | Admitting: Family Medicine

## 2018-11-06 ENCOUNTER — Telehealth: Payer: Self-pay | Admitting: *Deleted

## 2018-11-06 NOTE — Telephone Encounter (Signed)
Noted pt on basaglar 8 units at bedtime, glipizide Only give novolog before lunch if CBG > 150

## 2018-11-06 NOTE — Telephone Encounter (Signed)
Received call from Arty Baumgartner, SN with [place] [phone]~ telephone.   States that patient in center with Novolog Flexpen instead of Humalog [place]ikipen. Inquired as to if medication had changed.   Advised medication is interchangable, and depends on patient insurnace preference. Advised that current insurance prefers Novolog. Advised that patient is to continue 8U of insulin aspart prior to lunch.   SN also inquired as to if patient should take aspart everyday, even if CBG is low. Advised that if CBG noted <100, hold insulin.   Md to be made aware.

## 2018-11-07 ENCOUNTER — Other Ambulatory Visit: Payer: Self-pay | Admitting: Internal Medicine

## 2018-11-07 NOTE — Telephone Encounter (Signed)
Nurse at [facility] made aware.   Requested order to be faxed for records.   Order sent.

## 2018-11-27 ENCOUNTER — Other Ambulatory Visit: Payer: Self-pay | Admitting: Family Medicine

## 2018-11-27 NOTE — Telephone Encounter (Signed)
Ok to refill??  Last office visit 09/19/2018.  Last refill 09/03/2018, #2 refills.

## 2018-12-08 ENCOUNTER — Other Ambulatory Visit: Payer: Self-pay | Admitting: Internal Medicine

## 2018-12-08 DIAGNOSIS — R69 Illness, unspecified: Secondary | ICD-10-CM | POA: Diagnosis not present

## 2018-12-11 ENCOUNTER — Other Ambulatory Visit: Payer: Self-pay | Admitting: Family Medicine

## 2018-12-19 ENCOUNTER — Ambulatory Visit: Payer: Medicare HMO | Admitting: Family Medicine

## 2018-12-20 ENCOUNTER — Other Ambulatory Visit: Payer: Self-pay | Admitting: Family Medicine

## 2019-01-03 ENCOUNTER — Other Ambulatory Visit: Payer: Self-pay | Admitting: *Deleted

## 2019-01-03 MED ORDER — INSULIN DETEMIR 100 UNIT/ML FLEXPEN: 8.0000 [IU] | PEN_INJECTOR | Freq: Every day | SUBCUTANEOUS | 11 refills | Status: DC

## 2019-01-17 ENCOUNTER — Other Ambulatory Visit: Payer: Self-pay | Admitting: Family Medicine

## 2019-01-31 ENCOUNTER — Other Ambulatory Visit: Payer: Self-pay | Admitting: Family Medicine

## 2019-02-01 DIAGNOSIS — R69 Illness, unspecified: Secondary | ICD-10-CM | POA: Diagnosis not present

## 2019-02-25 ENCOUNTER — Other Ambulatory Visit: Payer: Self-pay | Admitting: Family Medicine

## 2019-02-26 NOTE — Telephone Encounter (Signed)
Requested Prescriptions   Pending Prescriptions Disp Refills  . ALPRAZolam (XANAX) 0.25 MG tablet [Pharmacy Med Name: ALPRAZOLAM 0.25MG  TABLETS] 30 tablet     Sig: TAKE 1 TABLET(0.25 MG) BY MOUTH AT BEDTIME AS NEEDED   Last OV 09/19/2018 Last written 11/27/2018

## 2019-02-28 ENCOUNTER — Telehealth: Payer: Self-pay | Admitting: Family Medicine

## 2019-02-28 NOTE — Telephone Encounter (Signed)
Received fmla paperwork for this patients family member charie sharpe  Will route to christinas nurse  On 02/28/2019

## 2019-03-21 DIAGNOSIS — R69 Illness, unspecified: Secondary | ICD-10-CM | POA: Diagnosis not present

## 2019-04-10 ENCOUNTER — Other Ambulatory Visit: Payer: Self-pay | Admitting: Family Medicine

## 2019-04-24 ENCOUNTER — Encounter: Payer: Self-pay | Admitting: Family Medicine

## 2019-04-24 ENCOUNTER — Ambulatory Visit (INDEPENDENT_AMBULATORY_CARE_PROVIDER_SITE_OTHER): Payer: Medicare HMO | Admitting: Family Medicine

## 2019-04-24 ENCOUNTER — Other Ambulatory Visit: Payer: Self-pay

## 2019-04-24 VITALS — BP 128/64 | HR 66 | Temp 98.5°F | Resp 12 | Ht 61.0 in | Wt 144.0 lb

## 2019-04-24 DIAGNOSIS — E1122 Type 2 diabetes mellitus with diabetic chronic kidney disease: Secondary | ICD-10-CM | POA: Diagnosis not present

## 2019-04-24 DIAGNOSIS — D229 Melanocytic nevi, unspecified: Secondary | ICD-10-CM | POA: Diagnosis not present

## 2019-04-24 DIAGNOSIS — F0151 Vascular dementia with behavioral disturbance: Secondary | ICD-10-CM | POA: Diagnosis not present

## 2019-04-24 DIAGNOSIS — L609 Nail disorder, unspecified: Secondary | ICD-10-CM

## 2019-04-24 DIAGNOSIS — K5900 Constipation, unspecified: Secondary | ICD-10-CM

## 2019-04-24 DIAGNOSIS — R69 Illness, unspecified: Secondary | ICD-10-CM | POA: Diagnosis not present

## 2019-04-24 DIAGNOSIS — I10 Essential (primary) hypertension: Secondary | ICD-10-CM

## 2019-04-24 DIAGNOSIS — F01518 Vascular dementia, unspecified severity, with other behavioral disturbance: Secondary | ICD-10-CM

## 2019-04-24 MED ORDER — DOCUSATE SODIUM 100 MG PO CAPS: 100.0000 mg | ORAL_CAPSULE | Freq: Two times a day (BID) | ORAL | 0 refills | Status: DC

## 2019-04-24 MED ORDER — NOVOLOG FLEXPEN 100 UNIT/ML ~~LOC~~ SOPN: 5.0000 [IU] | PEN_INJECTOR | Freq: Every day | SUBCUTANEOUS | 11 refills | Status: DC

## 2019-04-24 MED ORDER — LANCETS MISC: 11 refills | Status: DC

## 2019-04-24 MED ORDER — GLUCOSE BLOOD VI STRP: ORAL_STRIP | 11 refills | Status: DC

## 2019-04-24 MED ORDER — BLOOD GLUCOSE SYSTEM PAK KIT: PACK | 1 refills | Status: DC

## 2019-04-24 NOTE — Patient Instructions (Addendum)
Give 5 units of Novolog with meals Give colace or generic twice a day for stools  We will call pharmacy for more strips  We will call with lab results  F/U 4 months

## 2019-04-24 NOTE — Progress Notes (Signed)
Subjective:    Patient ID: Betty [name], female    DOB: 1931/01/04, 85 y.o.   MRN: [id number]  Patient presents for Follow-up (is not fasting), L Foot Toe Nail Issues (cut her oen nails and now 2cd toe nail is pulling away from nail bed), and Breast Discoloration (discoloration to skin under L breast- round dark brown patch)        Children taking care of mother    nO FAILLS     DM- CBG up and down, trying to cut the bread down,  14 DAY average 259  Levemir  given  At  7pm- 8 units  Novolog given 11am , but will  Drop By 3PM swill start sweaty, starring off    Occ checks sugar during this time, once down to 50's, they dont have enough strips to check more than twice a day   Dementia- getting up at 1am to get dressed, Clayvon able to redirect and put back in bed   Had thick nail she clipped, wants to have this checked, they have soaked in epson salt, left foot 3rd digit, not tender  Has mole on breast wants me to check   Gets constipated at times, not using anything OTC  Review Of Systems:  GEN- denies fatigue, fever, weight loss,weakness, recent illness HEENT- denies eye drainage, change in vision, nasal discharge, CVS- denies chest pain, palpitations RESP- denies SOB, cough, wheeze ABD- denies N/V, change in stools, abd pain GU- denies dysuria, hematuria, dribbling, incontinence MSK- denies joint pain, muscle aches, injury Neuro- denies headache, dizziness, syncope, seizure activity       Objective:    BP 128/64   Pulse 66   Temp 98.5 F (36.9 C) (Oral)   Resp 12   Ht 5\' 1"  (1.549 m)   Wt 144 lb (65.3 kg)   SpO2 98%   BMI 27.21 kg/m  GEN- NAD, alert and oriented xperson/place HEENT- PERRL, EOMI, non injected sclera, pink conjunctiva, MMM, oropharynx clear Neck- Supple, no thyromegaly CVS- RRR, 2/6 murmur RESP-CTAB ABD-NABS,soft,NT,ND EXT- No edema, left foot 3rd nail hypertrophic, still attached to nail bed, no erythema or swelling  Skin- uniform flat  hyperpigmented mole on left breast Pulses- Radial, DP- 2+        Assessment & Plan:      Problem List Items Addressed This Visit      Unprioritized   Constipation    Stool softner to be used      Dementia ([place])    Unchanged, 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, 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 Recommend she use her walker more than cane, she leans to far to left when walking, Right hip is arched up since her surgery      Essential hypertension, benign    Controlled no changes       Type II diabetes mellitus with renal manifestations (HCC) - Primary    Hypoglycemia with added meal time insulin Decrease novolog to 5 units Continue basal at  8units Continue oral meds Will check blood sugars four times a day, so I can figure out pattern and adjust further Daughter aware of how to treat hypoglycemia symptoms      Relevant Medications   insulin aspart (NOVOLOG FLEXPEN) 100 UNIT/ML FlexPen   Other Relevant Orders   CBC with Differential/Platelet (Completed)   Comprehensive metabolic panel (Completed)   Hemoglobin A1c (Completed)    Other Visit Diagnoses    Nail abnormality       very thick nail., mayhave partial  avulsion but still attached, advised if it comes off clean and bandage   Nevus       benign appearing      Note: This dictation was prepared with Dragon dictation along with smaller phrase technology. Any transcriptional errors that result from this process are unintentional.

## 2019-04-25 ENCOUNTER — Other Ambulatory Visit: Payer: Self-pay | Admitting: *Deleted

## 2019-04-25 ENCOUNTER — Encounter: Payer: Self-pay | Admitting: Family Medicine

## 2019-04-25 LAB — CBC WITH DIFFERENTIAL/PLATELET
Absolute Monocytes: 612 cells/uL (ref 200–950)
Basophils Absolute: 7 cells/uL (ref 0–200)
Basophils Relative: 0.1 %
Eosinophils Absolute: 22 cells/uL (ref 15–500)
Eosinophils Relative: 0.3 %
HCT: 35.1 % (ref 35.0–45.0)
Hemoglobin: 11.2 g/dL — ABNORMAL LOW (ref 11.7–15.5)
Lymphs Abs: 1310 cells/uL (ref 850–3900)
MCH: 27.1 pg (ref 27.0–33.0)
MCHC: 31.9 g/dL — ABNORMAL LOW (ref 32.0–36.0)
MCV: 84.8 fL (ref 80.0–100.0)
MPV: 11.5 fL (ref 7.5–12.5)
Monocytes Relative: 8.5 %
Neutro Abs: 5249 cells/uL (ref 1500–7800)
Neutrophils Relative %: 72.9 %
Platelets: 212 10*3/uL (ref 140–400)
RBC: 4.14 10*6/uL (ref 3.80–5.10)
RDW: 13.2 % (ref 11.0–15.0)
Total Lymphocyte: 18.2 %
WBC: 7.2 10*3/uL (ref 3.8–10.8)

## 2019-04-25 LAB — COMPREHENSIVE METABOLIC PANEL
AG Ratio: 1.4 (calc) (ref 1.0–2.5)
ALT: 15 U/L (ref 6–29)
AST: 17 U/L (ref 10–35)
Albumin: 3.6 g/dL (ref 3.6–5.1)
Alkaline phosphatase (APISO): 69 U/L (ref 37–153)
BUN/Creatinine Ratio: 22 (calc) (ref 6–22)
BUN: 34 mg/dL — ABNORMAL HIGH (ref 7–25)
CO2: 29 mmol/L (ref 20–32)
Calcium: 9.1 mg/dL (ref 8.6–10.4)
Chloride: 106 mmol/L (ref 98–110)
Creat: 1.58 mg/dL — ABNORMAL HIGH (ref 0.60–0.88)
Globulin: 2.6 g/dL (calc) (ref 1.9–3.7)
Glucose, Bld: 214 mg/dL — ABNORMAL HIGH (ref 65–99)
Potassium: 4.5 mmol/L (ref 3.5–5.3)
Sodium: 140 mmol/L (ref 135–146)
Total Bilirubin: 0.4 mg/dL (ref 0.2–1.2)
Total Protein: 6.2 g/dL (ref 6.1–8.1)

## 2019-04-25 LAB — HEMOGLOBIN A1C
Hgb A1c MFr Bld: 7.2 % of total Hgb — ABNORMAL HIGH (ref <5.7)
Mean Plasma Glucose: 160 (calc)
eAG (mmol/L): 8.9 (calc)

## 2019-04-25 MED ORDER — LANCETS MISC: 11 refills | Status: DC

## 2019-04-25 MED ORDER — GLUCOSE BLOOD VI STRP: ORAL_STRIP | 11 refills | Status: DC

## 2019-04-25 MED ORDER — BLOOD GLUCOSE SYSTEM PAK KIT: PACK | 1 refills | Status: DC

## 2019-04-25 NOTE — Assessment & Plan Note (Signed)
Controlled no changes 

## 2019-04-25 NOTE — Assessment & Plan Note (Signed)
Stool softner to be used

## 2019-04-25 NOTE — Assessment & Plan Note (Signed)
Hypoglycemia with added meal time insulin Decrease novolog to 5 units Continue basal at  8units Continue oral meds Will check blood sugars four times a day, so I can figure out pattern and adjust further Daughter aware of how to treat hypoglycemia symptoms

## 2019-04-25 NOTE — Assessment & Plan Note (Signed)
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

## 2019-05-02 ENCOUNTER — Telehealth: Payer: Self-pay | Admitting: Family Medicine

## 2019-05-02 DIAGNOSIS — R69 Illness, unspecified: Secondary | ICD-10-CM | POA: Diagnosis not present

## 2019-05-02 NOTE — Telephone Encounter (Signed)
Pt needs novolog needles sent to pharmacy [facility]

## 2019-05-03 ENCOUNTER — Other Ambulatory Visit: Payer: Self-pay | Admitting: Family Medicine

## 2019-05-03 ENCOUNTER — Telehealth: Payer: Self-pay | Admitting: Family Medicine

## 2019-05-03 MED ORDER — BD PEN NEEDLE MICRO U/F 32G X 6 MM MISC: 1 refills | Status: DC

## 2019-05-03 NOTE — Telephone Encounter (Signed)
Awaiting forms

## 2019-05-03 NOTE — Telephone Encounter (Signed)
Prescription sent to pharmacy.

## 2019-05-03 NOTE — Telephone Encounter (Signed)
Patients son clayvon brought in fmla forms for the care of [place]  Will route to [employer]

## 2019-05-06 ENCOUNTER — Other Ambulatory Visit: Payer: Self-pay | Admitting: Family Medicine

## 2019-05-06 NOTE — Telephone Encounter (Signed)
Reason [name] requested: assistance with medication management, ADL's, transportation  Requested Beginning Date: intermittent  Verbalized that fee may be charged and is per provider prerogative.   Forms routed to provider.

## 2019-05-06 NOTE — Telephone Encounter (Signed)
noted 

## 2019-05-10 ENCOUNTER — Other Ambulatory Visit: Payer: Self-pay | Admitting: Family Medicine

## 2019-05-21 ENCOUNTER — Other Ambulatory Visit: Payer: Self-pay | Admitting: Family Medicine

## 2019-05-21 ENCOUNTER — Telehealth: Payer: Self-pay | Admitting: Family Medicine

## 2019-05-21 NOTE — Telephone Encounter (Signed)
Received paperwork for patients son for fmla, to care for patient  Routed to [place] for completion

## 2019-05-21 NOTE — Telephone Encounter (Signed)
Upon review of request, was advised forms from 7/24/220 incomplete (missing 3rd page).   Prior [name] printed and faxed to [place] again.

## 2019-05-27 DIAGNOSIS — R69 Illness, unspecified: Secondary | ICD-10-CM | POA: Diagnosis not present

## 2019-06-01 ENCOUNTER — Other Ambulatory Visit: Payer: Self-pay | Admitting: Family Medicine

## 2019-06-11 ENCOUNTER — Telehealth: Payer: Self-pay | Admitting: Family Medicine

## 2019-06-11 NOTE — Telephone Encounter (Signed)
Awaiting forms

## 2019-06-11 NOTE — Telephone Encounter (Signed)
Received fmla paperwork for charie sharpe, patients family member for fmla  Will route to christina for completion

## 2019-06-12 NOTE — Telephone Encounter (Signed)
Received fax from [facility] for [place] for [employer], patient daughter  Call placed to patient for more information.   Reason [name] requested: 24 hr care of patient D/T dementia, DM, gait instability 2/2 hip fx 2019.  Requested Beginning Date: intermittent  Verbalized that fee may be charged and is per provider prerogative.   Forms routed to provider.

## 2019-06-12 NOTE — Telephone Encounter (Signed)
noted 

## 2019-06-17 DIAGNOSIS — R69 Illness, unspecified: Secondary | ICD-10-CM | POA: Diagnosis not present

## 2019-06-20 NOTE — Telephone Encounter (Signed)
Patient daughter Betty [name] returned call and verified dates of 06/21/2019- 06/30/2019, and return to work on 07/01/2019.

## 2019-06-20 NOTE — Telephone Encounter (Signed)
Received call from patient daughter Lynelle Smoke in regards  to [facility] for sister.  Reports that sister is requesting time off to care for mother so that Tammy can have a break.   Per Lynelle Smoke, daughter is requesting continuous time off of 06/21/2019- 06/30/2019. Cherie Tobin Chad is to verify dates.

## 2019-06-21 NOTE — Telephone Encounter (Signed)
Noted, [name] changed

## 2019-07-01 ENCOUNTER — Other Ambulatory Visit: Payer: Self-pay | Admitting: Family Medicine

## 2019-07-01 DIAGNOSIS — R69 Illness, unspecified: Secondary | ICD-10-CM | POA: Diagnosis not present

## 2019-07-01 NOTE — Telephone Encounter (Signed)
Requested Prescriptions   Pending Prescriptions Disp Refills  . ALPRAZolam (XANAX) 0.25 MG tablet [Pharmacy Med Name: ALPRAZOLAM 0.25MG  TABLETS] 30 tablet 3    Sig: TAKE 1 TABLET(0.25 MG) BY MOUTH AT BEDTIME AS NEEDED     Last OV 04/24/2019  Last written 02/26/2019

## 2019-07-11 ENCOUNTER — Telehealth: Payer: Self-pay | Admitting: *Deleted

## 2019-07-11 NOTE — Telephone Encounter (Signed)
Received call from patient son and caregiver, Clayvon.   States that patient dementia is progressing and sundowning is worsening.   States that she was taking Trazodone 25mg  at one point with the Xanax at night and noted this was beneficial and helped her rest. She has not been on Trazodone since 2018.   Patient is currently taking Xanax 0.25mg , Buspar 5mg , and Namzaric 28- 10mg  for dementia/ mood.   MD please advise.

## 2019-07-11 NOTE — Telephone Encounter (Signed)
I would have to defer this to her PCP having never met the patient.  I recommend not combining multiple medications at night.  If he feels the trazodone works better he can take trazodone instead of Xanax but I would not mix the 2

## 2019-07-12 DIAGNOSIS — R69 Illness, unspecified: Secondary | ICD-10-CM | POA: Diagnosis not present

## 2019-07-12 MED ORDER — TRAZODONE HCL 50 MG PO TABS: 25.0000 mg | ORAL_TABLET | Freq: Every evening | ORAL | 3 refills | Status: DC | PRN

## 2019-07-12 NOTE — Telephone Encounter (Signed)
Patient son Yvone Neu returned call and made aware.   Prescription sent to pharmacy.

## 2019-07-12 NOTE — Telephone Encounter (Signed)
I would recommend taking Trazodone 25mg  at bedtime, alone take this for 1 week, if not enough can increase to 50mg  which is the full tablet She will need to STOP the xanax Continue the other meds

## 2019-07-12 NOTE — Telephone Encounter (Signed)
Call placed to patient son, Clayvon. [place].

## 2019-07-12 NOTE — Telephone Encounter (Signed)
Routed to PCP for review

## 2019-07-23 ENCOUNTER — Ambulatory Visit: Payer: Medicare HMO | Admitting: Family Medicine

## 2019-07-27 ENCOUNTER — Other Ambulatory Visit: Payer: Self-pay | Admitting: Family Medicine

## 2019-07-29 ENCOUNTER — Ambulatory Visit (INDEPENDENT_AMBULATORY_CARE_PROVIDER_SITE_OTHER): Payer: Medicare HMO | Admitting: Family Medicine

## 2019-07-29 ENCOUNTER — Encounter: Payer: Self-pay | Admitting: Family Medicine

## 2019-07-29 ENCOUNTER — Other Ambulatory Visit: Payer: Self-pay

## 2019-07-29 VITALS — BP 142/74 | HR 68 | Temp 98.6°F | Resp 14 | Ht 61.0 in | Wt 152.0 lb

## 2019-07-29 DIAGNOSIS — F5104 Psychophysiologic insomnia: Secondary | ICD-10-CM

## 2019-07-29 DIAGNOSIS — R69 Illness, unspecified: Secondary | ICD-10-CM | POA: Diagnosis not present

## 2019-07-29 DIAGNOSIS — I10 Essential (primary) hypertension: Secondary | ICD-10-CM | POA: Diagnosis not present

## 2019-07-29 DIAGNOSIS — E782 Mixed hyperlipidemia: Secondary | ICD-10-CM | POA: Diagnosis not present

## 2019-07-29 DIAGNOSIS — E1122 Type 2 diabetes mellitus with diabetic chronic kidney disease: Secondary | ICD-10-CM | POA: Diagnosis not present

## 2019-07-29 DIAGNOSIS — F0151 Vascular dementia with behavioral disturbance: Secondary | ICD-10-CM | POA: Diagnosis not present

## 2019-07-29 DIAGNOSIS — F419 Anxiety disorder, unspecified: Secondary | ICD-10-CM | POA: Diagnosis not present

## 2019-07-29 DIAGNOSIS — F01518 Vascular dementia, unspecified severity, with other behavioral disturbance: Secondary | ICD-10-CM

## 2019-07-29 DIAGNOSIS — Z23 Encounter for immunization: Secondary | ICD-10-CM

## 2019-07-29 MED ORDER — QUETIAPINE FUMARATE 25 MG PO TABS: 25.0000 mg | ORAL_TABLET | Freq: Every day | ORAL | 2 refills | Status: DC

## 2019-07-29 MED ORDER — NAMZARIC 28-10 MG PO CP24: 1.0000 | ORAL_CAPSULE | Freq: Every day | ORAL | 11 refills | Status: DC

## 2019-07-29 NOTE — Progress Notes (Signed)
Subjective:    Patient ID: Betty [name], female    DOB: [birth year], 85 y.o.   MRN: [id number]  Patient presents for Dementia (increased confusion and agitation more pacing and unable to rest)   Pt here with her daughter  she goes between both her home where her son cares for her and her daughters home.  Often when she is at her daughter's home is when she has agitation especially during the daytime she often is asking to go home she is upset with everything.  When the grandchildren come over she does not want to be around them and gets even more agitated.  She does have agitation at nighttime as well whether she is at her own home or her daughters.  She will wake up around 1:00 in the morning get completely dressed asking them to take her places.  She had not been sleeping very well so they want to try her back on trazodone they do not notice any difference is regarding her sleep or her agitation during the day.  They do continue to give her her alprazolam she typically takes this around 8:00 every day.  Often she will get upset if she does not have her alprazolam 0.25 mg. Her appetite is good.  Trazodone didn't help   DM- due for repeat A1C, she is taking medications as prescribed.  She is on Levemir 8 units NovoLog 5 units with meals no recent hypoglycemia episodes.    Review Of Systems:  GEN- denies fatigue, fever, weight loss,weakness, recent illness HEENT- denies eye drainage, change in vision, nasal discharge, CVS- denies chest pain, palpitations RESP- denies SOB, cough, wheeze ABD- denies N/V, change in stools, abd pain GU- denies dysuria, hematuria, dribbling, incontinence MSK- denies joint pain, muscle aches, injury Neuro- denies headache, dizziness, syncope, seizure activity       Objective:    BP (!) 142/74   Pulse 68   Temp 98.6 F (37 C) (Oral)   Resp 14   Ht 5\' 1"  (1.549 m)   Wt 152 lb (68.9 kg)   SpO2 99%   BMI 28.72 kg/m  GEN- NAD, alert and oriented  x3 HEENT- PERRL, EOMI, non injected sclera, pink conjunctiva, MMM, oropharynx clear Neck- Supple, no thyromegaly CVS- RRR, no murmur RESP-CTAB ABD-NABS,soft,NT,ND EXT- No edema Pulses- Radial, DP- 2+        Assessment & Plan:      Problem List Items Addressed This Visit      Unprioritized   Anxiety   Chronic insomnia   Dementia ([place]) - Primary    She has vascular dementia with behavioral disturbances.  She will continue the Namzaric.  She did not respond well to trazodone.  I will start her on Seroquel 25 mg at bedtime we will try to move her alprazolam till midday or try to discontinue altogether as it is a very low dose.  We will also go ahead and get her referred to neurology to see if there are any other recommendations.  Unfortunately she is unable to stay in her own home which would cut down on the daily agitation because she needs 24-hour care and her son is working.      Relevant Medications   Memantine HCl-Donepezil HCl (NAMZARIC) 28-10 MG CP24   QUEtiapine (SEROQUEL) 25 MG tablet   Other Relevant Orders   Ambulatory referral to Neurology   Essential hypertension, benign    Has been fairly well controlled no change in medication.  Relevant Orders   CBC with Differential/Platelet   Comprehensive metabolic panel   Lipid panel   Hyperlipidemia    Continue Crestor      Type II diabetes mellitus with renal manifestations (HCC)    Last A1c 7.2% she is not had any hypoglycemic episodes.  She is on insulin therapy.  Her daughter and son provide her medications.      Relevant Orders   Hemoglobin A1c   Lipid panel   HM DIABETES FOOT EXAM (Completed)    Other Visit Diagnoses    Need for immunization against influenza       Relevant Orders   Flu Vaccine QUAD High Dose(Fluad) (Completed)      Note: This dictation was prepared with Dragon dictation along with smaller phrase technology. Any transcriptional errors that result from this process are unintentional.

## 2019-07-29 NOTE — Assessment & Plan Note (Signed)
Continue Crestor 

## 2019-07-29 NOTE — Patient Instructions (Addendum)
Referral tp Dr. Merlene Laughter  Stop the trazodone Start Seroquel 25mg  1 hour before bedtime Move the xanax to mid day if needed , do not give with the seroquel  Cancel F/U in November  F/U 3 months

## 2019-07-29 NOTE — Assessment & Plan Note (Signed)
Has been fairly well controlled no change in medication.

## 2019-07-29 NOTE — Assessment & Plan Note (Signed)
She has vascular dementia with behavioral disturbances.  She will continue the Namzaric.  She did not respond well to trazodone.  I will start her on Seroquel 25 mg at bedtime we will try to move her alprazolam till midday or try to discontinue altogether as it is a very low dose.  We will also go ahead and get her referred to neurology to see if there are any other recommendations.  Unfortunately she is unable to stay in her own home which would cut down on the daily agitation because she needs 24-hour care and her son is working.

## 2019-07-29 NOTE — Assessment & Plan Note (Signed)
Last A1c 7.2% she is not had any hypoglycemic episodes.  She is on insulin therapy.  Her daughter and son provide her medications.

## 2019-07-30 LAB — CBC WITH DIFFERENTIAL/PLATELET
Absolute Monocytes: 757 cells/uL (ref 200–950)
Basophils Absolute: 8 cells/uL (ref 0–200)
Basophils Relative: 0.1 %
Eosinophils Absolute: 31 cells/uL (ref 15–500)
Eosinophils Relative: 0.4 %
HCT: 35.4 % (ref 35.0–45.0)
Hemoglobin: 11.4 g/dL — ABNORMAL LOW (ref 11.7–15.5)
Lymphs Abs: 2020 cells/uL (ref 850–3900)
MCH: 27.1 pg (ref 27.0–33.0)
MCHC: 32.2 g/dL (ref 32.0–36.0)
MCV: 84.1 fL (ref 80.0–100.0)
MPV: 11.5 fL (ref 7.5–12.5)
Monocytes Relative: 9.7 %
Neutro Abs: 4984 cells/uL (ref 1500–7800)
Neutrophils Relative %: 63.9 %
Platelets: 214 10*3/uL (ref 140–400)
RBC: 4.21 10*6/uL (ref 3.80–5.10)
RDW: 13.6 % (ref 11.0–15.0)
Total Lymphocyte: 25.9 %
WBC: 7.8 10*3/uL (ref 3.8–10.8)

## 2019-07-30 LAB — COMPREHENSIVE METABOLIC PANEL
AG Ratio: 1.5 (calc) (ref 1.0–2.5)
ALT: 13 U/L (ref 6–29)
AST: 15 U/L (ref 10–35)
Albumin: 3.8 g/dL (ref 3.6–5.1)
Alkaline phosphatase (APISO): 77 U/L (ref 37–153)
BUN/Creatinine Ratio: 22 (calc) (ref 6–22)
BUN: 31 mg/dL — ABNORMAL HIGH (ref 7–25)
CO2: 27 mmol/L (ref 20–32)
Calcium: 9.1 mg/dL (ref 8.6–10.4)
Chloride: 109 mmol/L (ref 98–110)
Creat: 1.44 mg/dL — ABNORMAL HIGH (ref 0.60–0.88)
Globulin: 2.5 g/dL (calc) (ref 1.9–3.7)
Glucose, Bld: 149 mg/dL — ABNORMAL HIGH (ref 65–99)
Potassium: 5 mmol/L (ref 3.5–5.3)
Sodium: 147 mmol/L — ABNORMAL HIGH (ref 135–146)
Total Bilirubin: 0.4 mg/dL (ref 0.2–1.2)
Total Protein: 6.3 g/dL (ref 6.1–8.1)

## 2019-07-30 LAB — HEMOGLOBIN A1C
Hgb A1c MFr Bld: 7.5 % of total Hgb — ABNORMAL HIGH (ref <5.7)
Mean Plasma Glucose: 169 (calc)
eAG (mmol/L): 9.3 (calc)

## 2019-07-30 LAB — LIPID PANEL
Cholesterol: 179 mg/dL (ref <200)
HDL: 58 mg/dL (ref >=50)
LDL Cholesterol (Calc): 98 mg/dL (calc)
Non-HDL Cholesterol (Calc): 121 mg/dL (calc) (ref <130)
Total CHOL/HDL Ratio: 3.1 (calc) (ref <5.0)
Triglycerides: 133 mg/dL (ref <150)

## 2019-07-31 ENCOUNTER — Other Ambulatory Visit: Payer: Self-pay | Admitting: Family Medicine

## 2019-07-31 DIAGNOSIS — R69 Illness, unspecified: Secondary | ICD-10-CM | POA: Diagnosis not present

## 2019-08-01 ENCOUNTER — Encounter: Payer: Self-pay | Admitting: *Deleted

## 2019-08-21 ENCOUNTER — Telehealth: Payer: Self-pay | Admitting: *Deleted

## 2019-08-21 MED ORDER — NOVOLOG FLEXPEN 100 UNIT/ML ~~LOC~~ SOPN: 5.0000 [IU] | PEN_INJECTOR | Freq: Every day | SUBCUTANEOUS | 5 refills | Status: DC

## 2019-08-21 NOTE — Telephone Encounter (Signed)
New order signed.

## 2019-08-21 NOTE — Telephone Encounter (Signed)
Received call from [facility], Attn: Arty Baumgartner [phone] telephone/ [phone]~ fax.  Requested clarification on orders received on 08/13/2019. States that new order for lunchtime insulin was requested, but order for Levemir was received.   New order transcribed for Novolog 5U with lunch if FSBS >150.

## 2019-08-26 ENCOUNTER — Ambulatory Visit: Payer: Medicare HMO | Admitting: Family Medicine

## 2019-08-30 DIAGNOSIS — R69 Illness, unspecified: Secondary | ICD-10-CM | POA: Diagnosis not present

## 2019-09-06 ENCOUNTER — Other Ambulatory Visit: Payer: Self-pay | Admitting: *Deleted

## 2019-09-06 MED ORDER — NAMZARIC 28-10 MG PO CP24: 1.0000 | ORAL_CAPSULE | Freq: Every day | ORAL | 11 refills | Status: DC

## 2019-09-18 ENCOUNTER — Other Ambulatory Visit: Payer: Self-pay | Admitting: Family Medicine

## 2019-09-18 DIAGNOSIS — R69 Illness, unspecified: Secondary | ICD-10-CM | POA: Diagnosis not present

## 2019-09-24 ENCOUNTER — Other Ambulatory Visit: Payer: Self-pay | Admitting: Family Medicine

## 2019-09-24 DIAGNOSIS — R69 Illness, unspecified: Secondary | ICD-10-CM | POA: Diagnosis not present

## 2019-10-02 ENCOUNTER — Other Ambulatory Visit: Payer: Self-pay | Admitting: Family Medicine

## 2019-10-11 DIAGNOSIS — R69 Illness, unspecified: Secondary | ICD-10-CM | POA: Diagnosis not present

## 2019-10-17 ENCOUNTER — Other Ambulatory Visit: Payer: Self-pay | Admitting: Family Medicine

## 2019-10-24 ENCOUNTER — Other Ambulatory Visit: Payer: Self-pay | Admitting: Family Medicine

## 2019-10-24 DIAGNOSIS — R69 Illness, unspecified: Secondary | ICD-10-CM | POA: Diagnosis not present

## 2019-10-27 ENCOUNTER — Other Ambulatory Visit: Payer: Self-pay | Admitting: Family Medicine

## 2019-10-28 ENCOUNTER — Ambulatory Visit: Payer: Medicare HMO | Admitting: Family Medicine

## 2019-10-28 ENCOUNTER — Telehealth: Payer: Self-pay | Admitting: Family Medicine

## 2019-10-28 NOTE — Telephone Encounter (Signed)
Nurses portion completed form forwarded to Dr. Buelah Manis

## 2019-10-28 NOTE — Telephone Encounter (Signed)
Patient brought fmla paper work to be completed on behalf of his mother ziza hastings to care for her, loyd salvador)  Will route to [employer] for completion

## 2019-10-29 NOTE — Telephone Encounter (Signed)
Reason [name] requested: assistance with medication management, ADL's, transportation  Requested Beginning Date: intermittent  Received completed [name] from provider.   Simple form fee to be charged per provider. Patient son contacted to pay fee. [place].

## 2019-10-30 NOTE — Telephone Encounter (Signed)
Call placed to patient and patient son Betty [name] made aware per VM.

## 2019-11-05 DIAGNOSIS — R69 Illness, unspecified: Secondary | ICD-10-CM | POA: Diagnosis not present

## 2019-11-06 ENCOUNTER — Other Ambulatory Visit: Payer: Self-pay | Admitting: Family Medicine

## 2019-11-06 ENCOUNTER — Encounter: Payer: Self-pay | Admitting: Family Medicine

## 2019-11-06 ENCOUNTER — Ambulatory Visit (INDEPENDENT_AMBULATORY_CARE_PROVIDER_SITE_OTHER): Payer: Medicare HMO | Admitting: Family Medicine

## 2019-11-06 ENCOUNTER — Other Ambulatory Visit: Payer: Self-pay

## 2019-11-06 VITALS — BP 144/78 | HR 82 | Temp 97.8°F | Resp 16 | Ht 61.0 in | Wt 149.0 lb

## 2019-11-06 DIAGNOSIS — E441 Mild protein-calorie malnutrition: Secondary | ICD-10-CM

## 2019-11-06 DIAGNOSIS — S91209A Unspecified open wound of unspecified toe(s) with damage to nail, initial encounter: Secondary | ICD-10-CM | POA: Diagnosis not present

## 2019-11-06 DIAGNOSIS — N1832 Chronic kidney disease, stage 3b: Secondary | ICD-10-CM | POA: Diagnosis not present

## 2019-11-06 DIAGNOSIS — Q845 Enlarged and hypertrophic nails: Secondary | ICD-10-CM

## 2019-11-06 DIAGNOSIS — E1122 Type 2 diabetes mellitus with diabetic chronic kidney disease: Secondary | ICD-10-CM

## 2019-11-06 DIAGNOSIS — R69 Illness, unspecified: Secondary | ICD-10-CM | POA: Diagnosis not present

## 2019-11-06 DIAGNOSIS — F0151 Vascular dementia with behavioral disturbance: Secondary | ICD-10-CM | POA: Diagnosis not present

## 2019-11-06 DIAGNOSIS — F01518 Vascular dementia, unspecified severity, with other behavioral disturbance: Secondary | ICD-10-CM

## 2019-11-06 DIAGNOSIS — I10 Essential (primary) hypertension: Secondary | ICD-10-CM

## 2019-11-06 NOTE — Progress Notes (Signed)
Subjective:    Patient ID: Betty [name], female    DOB: 1931-04-29, 86 y.o.   MRN: [id number]  Patient presents for Follow-up (is fasting) Here to follow-up chronic medical problems.  She is here with her daughter who is her caregiver.  Her son also helps care for her   Dementia -no major changes since her last visit in October with regards to her dementia.  Her children have to remind her to eat also help her with all of her ADLs.  Continued on Namzaric at the last visit.  I started her on Seroquel 25 mg at bedtime to help with her sleeping issues and wandering at night.  She was already on alprazolam low-dose which she takes in the midday and she was also referred to neurology.  Seroquel has helped with her sleep.  She did have 1 visit with the neurologist family states there was nothing more to really add to her care.  She has not had any falls.  She is eating fairly well.  Diabetes mellitus last A1c 0.5% in December 2020.  She is currently on Levemir 8 units and NovoLog 5 units with meals hypoglycemic episodes.  CBGs range   Anemia of renal disease unchanged.  Hyperlipidemia has been maintained on Crestor without any difficulty   concerned about thickened nails on her toes.  1 is lifting nothing causes her pain.  They would like to have this evaluated by podiatry Review Of Systems:  GEN- denies fatigue, fever, weight loss,weakness, recent illness HEENT- denies eye drainage, change in vision, nasal discharge, CVS- denies chest pain, palpitations RESP- denies SOB, cough, wheeze ABD- denies N/V, change in stools, abd pain GU- denies dysuria, hematuria, dribbling, incontinence MSK- denies joint pain, muscle aches, injury Neuro- denies headache, dizziness, syncope, seizure activity       Objective:    BP (!) 144/78   Pulse 82   Temp 97.8 F (36.6 C) (Temporal)   Resp 16   Ht 5\' 1"  (1.549 m)   Wt 149 lb (67.6 kg)   SpO2 96%   BMI 28.15 kg/m  GEN- NAD, alert and  oriented x person and place, pleasant HEENT- PERRL, EOMI, non injected sclera, pink conjunctiva Neck- Supple, no thyromegaly CVS- RRR, no murmur RESP-CTAB ABD-NABS,soft,NT,ND EXT- No edema Skin right foot second digit nail is partially avulsed from the nail bed no erythema tenderness noted very thick nail.  Very hypertrophic nail on the left foot second digit as well Pulses- Radial, DP- 2+        Assessment & Plan:      Problem List Items Addressed This Visit      Unprioritized   CKD (chronic kidney disease), stage III (HCC)   Dementia (HCC)    Unchanged.  Her sleep however has improved with the Seroquel.  They also continue to use occasional alprazolam for any anxiety.      Essential hypertension, benign - Primary    Pressure is controlled.  She is actually not had any medications this morning she is fasting.  No change to her current meds      Relevant Orders   Lipid panel   Mild protein malnutrition ([place])    Overall she is doing fairly well with regards to her appetite and trying to maintain her weight within a few pounds      Type II diabetes mellitus with renal manifestations ([place])    Goal is A1c less than 8%.  Some days she has not required her  mealtime insulin because of lower sugars.  We will recheck her A1c and adjust as needed.      Relevant Orders   CBC with Differential/Platelet   Comprehensive metabolic panel   Hemoglobin A1c   Lipid panel    Other Visit Diagnoses    Enlarged and hypertrophic nails       Referral to podiatry.  She has very thick hypertrophic nails on both toes 1 is slightly avulsed from the nail bed I think that she would benefit from having this nail removed completely.      Note: This dictation was prepared with Dragon dictation along with smaller phrase technology. Any transcriptional errors that result from this process are unintentional.

## 2019-11-06 NOTE — Assessment & Plan Note (Signed)
Goal is A1c less than 8%.  Some days she has not required her mealtime insulin because of lower sugars.  We will recheck her A1c and adjust as needed.

## 2019-11-06 NOTE — Assessment & Plan Note (Signed)
Unchanged.  Her sleep however has improved with the Seroquel.  They also continue to use occasional alprazolam for any anxiety.

## 2019-11-06 NOTE — Patient Instructions (Addendum)
F/U 4 months for wellness physical visit  Referral to Dr. Caprice Beaver office for her nails  COVID Vaccination Information As of right now, we will not be giving COVID-19 vaccines here in our office. It is too many storage and administrating regulations that our office is not equipped to provide at this time.    You can go online at [email or web address]   That website will give you information on all counties.    If not here are the numbers you can call. [place] or [place] [phone] opt.2 [facility] Department [phone]   You can also find information on [email or web address] or call the state's COVID-19 information phone number at 211.

## 2019-11-06 NOTE — Assessment & Plan Note (Signed)
Overall she is doing fairly well with regards to her appetite and trying to maintain her weight within a few pounds

## 2019-11-06 NOTE — Assessment & Plan Note (Signed)
Pressure is controlled.  She is actually not had any medications this morning she is fasting.  No change to her current meds

## 2019-11-07 LAB — COMPREHENSIVE METABOLIC PANEL
AG Ratio: 1.4 (calc) (ref 1.0–2.5)
ALT: 13 U/L (ref 6–29)
AST: 16 U/L (ref 10–35)
Albumin: 3.8 g/dL (ref 3.6–5.1)
Alkaline phosphatase (APISO): 79 U/L (ref 37–153)
BUN/Creatinine Ratio: 20 (calc) (ref 6–22)
BUN: 31 mg/dL — ABNORMAL HIGH (ref 7–25)
CO2: 26 mmol/L (ref 20–32)
Calcium: 9.1 mg/dL (ref 8.6–10.4)
Chloride: 106 mmol/L (ref 98–110)
Creat: 1.55 mg/dL — ABNORMAL HIGH (ref 0.60–0.88)
Globulin: 2.8 g/dL (calc) (ref 1.9–3.7)
Glucose, Bld: 150 mg/dL — ABNORMAL HIGH (ref 65–99)
Potassium: 4.5 mmol/L (ref 3.5–5.3)
Sodium: 142 mmol/L (ref 135–146)
Total Bilirubin: 0.4 mg/dL (ref 0.2–1.2)
Total Protein: 6.6 g/dL (ref 6.1–8.1)

## 2019-11-07 LAB — CBC WITH DIFFERENTIAL/PLATELET
Absolute Monocytes: 678 cells/uL (ref 200–950)
Basophils Absolute: 13 cells/uL (ref 0–200)
Basophils Relative: 0.2 %
Eosinophils Absolute: 32 cells/uL (ref 15–500)
Eosinophils Relative: 0.5 %
HCT: 33.1 % — ABNORMAL LOW (ref 35.0–45.0)
Hemoglobin: 10.7 g/dL — ABNORMAL LOW (ref 11.7–15.5)
Lymphs Abs: 1734 cells/uL (ref 850–3900)
MCH: 26.8 pg — ABNORMAL LOW (ref 27.0–33.0)
MCHC: 32.3 g/dL (ref 32.0–36.0)
MCV: 83 fL (ref 80.0–100.0)
MPV: 11 fL (ref 7.5–12.5)
Monocytes Relative: 10.6 %
Neutro Abs: 3942 cells/uL (ref 1500–7800)
Neutrophils Relative %: 61.6 %
Platelets: 206 10*3/uL (ref 140–400)
RBC: 3.99 10*6/uL (ref 3.80–5.10)
RDW: 13 % (ref 11.0–15.0)
Total Lymphocyte: 27.1 %
WBC: 6.4 10*3/uL (ref 3.8–10.8)

## 2019-11-07 LAB — LIPID PANEL
Cholesterol: 169 mg/dL (ref <200)
HDL: 59 mg/dL (ref >=50)
LDL Cholesterol (Calc): 91 mg/dL (calc)
Non-HDL Cholesterol (Calc): 110 mg/dL (calc) (ref <130)
Total CHOL/HDL Ratio: 2.9 (calc) (ref <5.0)
Triglycerides: 99 mg/dL (ref <150)

## 2019-11-07 LAB — HEMOGLOBIN A1C
Hgb A1c MFr Bld: 7.8 % of total Hgb — ABNORMAL HIGH (ref <5.7)
Mean Plasma Glucose: 177 (calc)
eAG (mmol/L): 9.8 (calc)

## 2019-11-12 ENCOUNTER — Encounter: Payer: Self-pay | Admitting: *Deleted

## 2019-11-19 ENCOUNTER — Other Ambulatory Visit: Payer: Self-pay | Admitting: Family Medicine

## 2019-11-23 ENCOUNTER — Ambulatory Visit: Payer: Medicare HMO

## 2019-11-23 DIAGNOSIS — R69 Illness, unspecified: Secondary | ICD-10-CM | POA: Diagnosis not present

## 2019-11-24 ENCOUNTER — Ambulatory Visit: Payer: Medicare HMO | Attending: Internal Medicine

## 2019-11-24 DIAGNOSIS — Z23 Encounter for immunization: Secondary | ICD-10-CM

## 2019-11-24 NOTE — Progress Notes (Signed)
   Covid-19 Vaccination Clinic  Name:  Betty [name]    MRN: [id number] DOB: 07-03-1931  11/24/2019  [name] was observed post Covid-19 immunization for 15 minutes without incidence. She was provided with Vaccine Information Sheet and instruction to access the V-Safe system.   [name] was instructed to call 911 with any severe reactions post vaccine: Marland Kitchen Difficulty breathing  . Swelling of your face and throat  . A fast heartbeat  . A bad rash all over your body  . Dizziness and weakness    Immunizations Administered    Name Date Dose VIS Date Route   Pfizer COVID-19 Vaccine 11/24/2019 12:06 PM 0.3 mL 09/13/2019 Intramuscular   Manufacturer: [place]   Lot: [id number]   [place]: [id number]

## 2019-11-28 ENCOUNTER — Other Ambulatory Visit: Payer: Self-pay | Admitting: Family Medicine

## 2019-11-28 DIAGNOSIS — R69 Illness, unspecified: Secondary | ICD-10-CM | POA: Diagnosis not present

## 2019-12-09 DIAGNOSIS — B351 Tinea unguium: Secondary | ICD-10-CM | POA: Diagnosis not present

## 2019-12-09 DIAGNOSIS — E1151 Type 2 diabetes mellitus with diabetic peripheral angiopathy without gangrene: Secondary | ICD-10-CM | POA: Diagnosis not present

## 2019-12-09 DIAGNOSIS — M2011 Hallux valgus (acquired), right foot: Secondary | ICD-10-CM | POA: Diagnosis not present

## 2019-12-09 DIAGNOSIS — M2012 Hallux valgus (acquired), left foot: Secondary | ICD-10-CM | POA: Diagnosis not present

## 2019-12-18 ENCOUNTER — Ambulatory Visit: Payer: Medicare HMO | Attending: Internal Medicine

## 2019-12-18 DIAGNOSIS — Z23 Encounter for immunization: Secondary | ICD-10-CM

## 2019-12-18 NOTE — Progress Notes (Signed)
   Covid-19 Vaccination Clinic  Name:  Betty [name]    MRN: [id number] DOB: 06/20/1931  12/18/2019  Betty [name] was observed post Covid-19 immunization for 15 minutes without incident. She was provided with Vaccine Information Sheet and instruction to access the V-Safe system.   Betty [name] was instructed to call 911 with any severe reactions post vaccine: Marland Kitchen Difficulty breathing  . Swelling of face and throat  . A fast heartbeat  . A bad rash all over body  . Dizziness and weakness   Immunizations Administered    Name Date Dose VIS Date Route   Pfizer COVID-19 Vaccine 12/18/2019  8:53 AM 0.3 mL 09/13/2019 Intramuscular   Manufacturer: [place]   Lot: [id number]   [place]: [id number]

## 2019-12-21 DIAGNOSIS — R69 Illness, unspecified: Secondary | ICD-10-CM | POA: Diagnosis not present

## 2019-12-23 DIAGNOSIS — R69 Illness, unspecified: Secondary | ICD-10-CM | POA: Diagnosis not present

## 2020-01-01 DIAGNOSIS — R69 Illness, unspecified: Secondary | ICD-10-CM | POA: Diagnosis not present

## 2020-01-01 DIAGNOSIS — E119 Type 2 diabetes mellitus without complications: Secondary | ICD-10-CM | POA: Diagnosis not present

## 2020-01-01 DIAGNOSIS — E785 Hyperlipidemia, unspecified: Secondary | ICD-10-CM | POA: Diagnosis not present

## 2020-01-01 DIAGNOSIS — Z794 Long term (current) use of insulin: Secondary | ICD-10-CM | POA: Diagnosis not present

## 2020-01-01 DIAGNOSIS — R609 Edema, unspecified: Secondary | ICD-10-CM | POA: Diagnosis not present

## 2020-01-01 DIAGNOSIS — I10 Essential (primary) hypertension: Secondary | ICD-10-CM | POA: Diagnosis not present

## 2020-01-01 DIAGNOSIS — Z96649 Presence of unspecified artificial hip joint: Secondary | ICD-10-CM | POA: Diagnosis not present

## 2020-01-07 ENCOUNTER — Other Ambulatory Visit: Payer: Self-pay | Admitting: Family Medicine

## 2020-01-14 DIAGNOSIS — R69 Illness, unspecified: Secondary | ICD-10-CM | POA: Diagnosis not present

## 2020-01-17 ENCOUNTER — Other Ambulatory Visit: Payer: Self-pay | Admitting: Family Medicine

## 2020-01-17 DIAGNOSIS — R69 Illness, unspecified: Secondary | ICD-10-CM | POA: Diagnosis not present

## 2020-01-19 ENCOUNTER — Other Ambulatory Visit: Payer: Self-pay | Admitting: Family Medicine

## 2020-01-26 ENCOUNTER — Other Ambulatory Visit: Payer: Self-pay | Admitting: Family Medicine

## 2020-02-06 DIAGNOSIS — R69 Illness, unspecified: Secondary | ICD-10-CM | POA: Diagnosis not present

## 2020-02-07 DIAGNOSIS — R4020 Unspecified coma: Secondary | ICD-10-CM | POA: Diagnosis not present

## 2020-02-07 DIAGNOSIS — E1165 Type 2 diabetes mellitus with hyperglycemia: Secondary | ICD-10-CM | POA: Diagnosis not present

## 2020-02-07 DIAGNOSIS — R55 Syncope and collapse: Secondary | ICD-10-CM | POA: Diagnosis not present

## 2020-02-14 ENCOUNTER — Other Ambulatory Visit: Payer: Self-pay | Admitting: Family Medicine

## 2020-02-14 DIAGNOSIS — R69 Illness, unspecified: Secondary | ICD-10-CM | POA: Diagnosis not present

## 2020-02-23 ENCOUNTER — Other Ambulatory Visit: Payer: Self-pay | Admitting: Family Medicine

## 2020-02-24 DIAGNOSIS — E1151 Type 2 diabetes mellitus with diabetic peripheral angiopathy without gangrene: Secondary | ICD-10-CM | POA: Diagnosis not present

## 2020-02-24 DIAGNOSIS — B351 Tinea unguium: Secondary | ICD-10-CM | POA: Diagnosis not present

## 2020-02-28 DIAGNOSIS — R69 Illness, unspecified: Secondary | ICD-10-CM | POA: Diagnosis not present

## 2020-03-06 ENCOUNTER — Encounter: Payer: Medicare HMO | Admitting: Family Medicine

## 2020-03-09 ENCOUNTER — Encounter: Payer: Self-pay | Admitting: Family Medicine

## 2020-03-09 ENCOUNTER — Ambulatory Visit (HOSPITAL_COMMUNITY)
Admission: RE | Admit: 2020-03-09 | Discharge: 2020-03-09 | Disposition: A | Discharge status: Home / Self Care | Payer: Medicare HMO | Source: Ambulatory Visit | Attending: Family Medicine | Admitting: Family Medicine

## 2020-03-09 ENCOUNTER — Other Ambulatory Visit: Payer: Self-pay

## 2020-03-09 ENCOUNTER — Other Ambulatory Visit: Payer: Self-pay | Admitting: *Deleted

## 2020-03-09 ENCOUNTER — Ambulatory Visit (INDEPENDENT_AMBULATORY_CARE_PROVIDER_SITE_OTHER): Payer: Medicare HMO | Admitting: Family Medicine

## 2020-03-09 VITALS — BP 134/60 | HR 62 | Temp 97.9°F | Resp 14 | Ht 61.0 in | Wt 151.0 lb

## 2020-03-09 DIAGNOSIS — F0151 Vascular dementia with behavioral disturbance: Secondary | ICD-10-CM

## 2020-03-09 DIAGNOSIS — M545 Low back pain, unspecified: Secondary | ICD-10-CM

## 2020-03-09 DIAGNOSIS — W19XXXA Unspecified fall, initial encounter: Secondary | ICD-10-CM | POA: Insufficient documentation

## 2020-03-09 DIAGNOSIS — E782 Mixed hyperlipidemia: Secondary | ICD-10-CM | POA: Diagnosis not present

## 2020-03-09 DIAGNOSIS — E1122 Type 2 diabetes mellitus with diabetic chronic kidney disease: Secondary | ICD-10-CM | POA: Diagnosis not present

## 2020-03-09 DIAGNOSIS — I10 Essential (primary) hypertension: Secondary | ICD-10-CM

## 2020-03-09 DIAGNOSIS — W19XXXS Unspecified fall, sequela: Secondary | ICD-10-CM

## 2020-03-09 DIAGNOSIS — M25552 Pain in left hip: Secondary | ICD-10-CM

## 2020-03-09 DIAGNOSIS — Z8781 Personal history of (healed) traumatic fracture: Secondary | ICD-10-CM

## 2020-03-09 DIAGNOSIS — Z0001 Encounter for general adult medical examination with abnormal findings: Secondary | ICD-10-CM | POA: Diagnosis not present

## 2020-03-09 DIAGNOSIS — S3992XA Unspecified injury of lower back, initial encounter: Secondary | ICD-10-CM | POA: Diagnosis not present

## 2020-03-09 DIAGNOSIS — M5136 Other intervertebral disc degeneration, lumbar region: Secondary | ICD-10-CM | POA: Diagnosis not present

## 2020-03-09 DIAGNOSIS — N1832 Chronic kidney disease, stage 3b: Secondary | ICD-10-CM

## 2020-03-09 DIAGNOSIS — Z0000 Encounter for general adult medical examination without abnormal findings: Secondary | ICD-10-CM

## 2020-03-09 DIAGNOSIS — I700 Atherosclerosis of aorta: Secondary | ICD-10-CM | POA: Insufficient documentation

## 2020-03-09 DIAGNOSIS — F01518 Vascular dementia, unspecified severity, with other behavioral disturbance: Secondary | ICD-10-CM

## 2020-03-09 DIAGNOSIS — F0390 Unspecified dementia without behavioral disturbance: Secondary | ICD-10-CM | POA: Insufficient documentation

## 2020-03-09 DIAGNOSIS — Z9889 Other specified postprocedural states: Secondary | ICD-10-CM

## 2020-03-09 DIAGNOSIS — R69 Illness, unspecified: Secondary | ICD-10-CM | POA: Diagnosis not present

## 2020-03-09 MED ORDER — TRIAMCINOLONE ACETONIDE 0.1 % EX CREA
1.0000 | TOPICAL_CREAM | Freq: Two times a day (BID) | CUTANEOUS | 0 refills | Status: DC
Start: 2020-03-09 — End: 2020-08-25

## 2020-03-09 NOTE — Progress Notes (Addendum)
Subjective:   Patient presents for Medicare Annual/Subsequent preventive examination.    Pt here with daughter  No conerns   Dementia - She is currently at the [facility] 4 day 8-4pm Then family rotatoes in the evening   She had a fall 1 month ago, concern she may have passed out, her eyes were rollingback in her head, was very clammy at the time  EMS ws called and everying checked out okay, she has not complained of anything that they can tell BP was was good and they have been checking since then and still normal     DM- Last A1C 7.8% in Feb  CBG range  200-250, no hypoglyccemia   Novolog  5 units at lunch                8 units at dinner  Glipizide   Right flank hyperpigmented flaky circular mole that she scratches at all the time , request a topical   Review Past Medical/Family/Social: Per EMR   Risk Factors  Current exercise habits: none Dietary issues discussed: no major concerns   Cardiac risk factors: HTN   Depression Screen  - No concerns from Family- DEMENTIA  (Note: if answer to either of the following is "Yes", a more complete depression screening is indicated)  Over the past two weeks, have you felt down, depressed or hopeless? No Over the past two weeks, have you felt little interest or pleasure in doing things? No Have you lost interest or pleasure in daily life? No Do you often feel hopeless? No Do you cry easily over simple problems? No   Activities of Daily Living  In your present state of health, do you have any difficulty performing the following activities?:  Driving? Yes  Managing money? Yes Feeding yourself? No  Getting from bed to chair? No  Climbing a flight of stairs? Yes  Preparing food and eating?: Yes  Bathing or showering? Yes Getting dressed: Yes Getting to the toilet? No  Using the toilet:No  Moving around from place to place: Needs assistance   In the past year have you fallen or had a near fall?:Yes  Are you sexually active?  No  Do you have more than one partner? No   Hearing Difficulties: Yes   Do you often ask people to speak up or repeat themselves? No  Do you experience ringing or noises in your ears? No Do you have difficulty understanding soft or whispered voices? No  Do you feel that you have a problem with memory? No Do you often misplace items? No  Do you feel safe at home? Yes  Cognitive Testing  Alert? Yes Normal Appearance?Yes  Oriented to person? Yes Place? Yes  Time? Yes  Recall of three objects? Yes  Can perform simple calculations? Yes  Displays appropriate judgment?Yes  Can read the correct time from a watch face?Yes   List the Names of Other Physician/Practitioners you currently use:  Podiatry  Ophthalmology     Screening Tests / Date COVID UTD                      Zostavax  UTD  PNA- UTD Influenza Vaccine UTD Tetanus/tdap - defer at this time   ROS: Unable to obtain from pt  GEN- denies fatigue, fever, weight loss,weakness, recent illness HEENT- denies eye drainage, change in vision, nasal discharge, CVS- denies chest pain, palpitations RESP- denies SOB, cough, wheeze ABD- denies N/V, change in stools, abd pain  GU- denies dysuria, hematuria, dribbling, incontinence MSK- denies joint pain, muscle aches, injury Neuro- denies headache, dizziness, syncope, seizure activity    GEN- NAD, alert and oriented x person/place  HEENT- PERRL, EOMI, non injected sclera, pink conjunctiva, MMM, oropharynx clear, TM clear no effusion, nare clear Neck- Supple, no thyromegaly, fair ROM  CVS- RRR, no murmur RESP-CTAB ABD-NABS,soft,NT,ND MSK- TTP Left lumbar spine, fair ROM, TTP Left hip, pain with IR, shuffling gait at baseline  fair ROM bilat knees, no effusion  Skin- hyperpigmented circular nickle size scaley nevus, NT EXT- No edema Pulses- Radial, DP- 2+   Assessment:    Annual wellness medicare exam   Plan:    During the course of the visit the patient was educated and  counseled about appropriate screening and preventive services including:    Dementia- requiring 24-hour assistance.  He has been at the [facility] during the daytime.  Her family takes care of her otherwise.  They help her with all of her ADLs.  Continue Namzaric.  Anxiety controlled with BuSpar and she is on lorazepam as needed and Seroquel at bedtime  Hyperlipidemia continue Crestor goal of LDL less than 100  Diabetes mellitus A1c less than 8% is her goal.  Continue NovoLog as well as her glipizide.  HTN  controlled no change in medication  History of left hip fracture status post fall and has pain and tenderness on exam.  Unfortunately because of her dementia she does not complain about anything.  She also walks with a short shuffling gait and has to require Rollator at baseline.  Will obtain x-ray of her lumbar spine and left hip where she had tenderness on examination to ensure no injury from her recent fall.  Defer- bone density as this will not change management at her ager and with her comorbidites  Children are HPOA   Irritated nevus- pt likes to pick at lesion, given Triamcinolone cream BID       Diet review for nutrition referral? Yes ____ Not Indicated __x__  Patient Instructions (the written plan) was given to the patient.  Medicare Attestation  I have personally reviewed:  The patient's medical and social history  Their use of alcohol, tobacco or illicit drugs  Their current medications and supplements  The patient's functional ability including ADLs,fall risks, home safety risks, cognitive, and hearing and visual impairment  Diet and physical activities  Evidence for depression or mood disorders  The patient's weight, height, BMI, and visual acuity have been recorded in the chart. I have made referrals, counseling, and provided education to the patient based on review of the above and I have provided the patient with a written personalized care plan for  preventive services.

## 2020-03-09 NOTE — Patient Instructions (Addendum)
F/u 4 months We will call with lab and xray results  Schedule eye exam

## 2020-03-10 ENCOUNTER — Other Ambulatory Visit: Payer: Self-pay | Admitting: Family Medicine

## 2020-03-10 LAB — HEMOGLOBIN A1C
Hgb A1c MFr Bld: 8 % of total Hgb — ABNORMAL HIGH (ref <5.7)
Mean Plasma Glucose: 183 (calc)
eAG (mmol/L): 10.1 (calc)

## 2020-03-10 LAB — COMPREHENSIVE METABOLIC PANEL
AG Ratio: 1.4 (calc) (ref 1.0–2.5)
ALT: 13 U/L (ref 6–29)
AST: 14 U/L (ref 10–35)
Albumin: 3.7 g/dL (ref 3.6–5.1)
Alkaline phosphatase (APISO): 72 U/L (ref 37–153)
BUN/Creatinine Ratio: 27 (calc) — ABNORMAL HIGH (ref 6–22)
BUN: 45 mg/dL — ABNORMAL HIGH (ref 7–25)
CO2: 28 mmol/L (ref 20–32)
Calcium: 8.9 mg/dL (ref 8.6–10.4)
Chloride: 109 mmol/L (ref 98–110)
Creat: 1.65 mg/dL — ABNORMAL HIGH (ref 0.60–0.88)
Globulin: 2.6 g/dL (calc) (ref 1.9–3.7)
Glucose, Bld: 96 mg/dL (ref 65–99)
Potassium: 4.7 mmol/L (ref 3.5–5.3)
Sodium: 143 mmol/L (ref 135–146)
Total Bilirubin: 0.3 mg/dL (ref 0.2–1.2)
Total Protein: 6.3 g/dL (ref 6.1–8.1)

## 2020-03-10 LAB — CBC WITH DIFFERENTIAL/PLATELET
Absolute Monocytes: 551 cells/uL (ref 200–950)
Basophils Absolute: 20 cells/uL (ref 0–200)
Basophils Relative: 0.3 %
Eosinophils Absolute: 27 cells/uL (ref 15–500)
Eosinophils Relative: 0.4 %
HCT: 34.4 % — ABNORMAL LOW (ref 35.0–45.0)
Hemoglobin: 10.7 g/dL — ABNORMAL LOW (ref 11.7–15.5)
Lymphs Abs: 1720 cells/uL (ref 850–3900)
MCH: 26.4 pg — ABNORMAL LOW (ref 27.0–33.0)
MCHC: 31.1 g/dL — ABNORMAL LOW (ref 32.0–36.0)
MCV: 84.9 fL (ref 80.0–100.0)
MPV: 11.4 fL (ref 7.5–12.5)
Monocytes Relative: 8.1 %
Neutro Abs: 4481 cells/uL (ref 1500–7800)
Neutrophils Relative %: 65.9 %
Platelets: 179 10*3/uL (ref 140–400)
RBC: 4.05 10*6/uL (ref 3.80–5.10)
RDW: 13.4 % (ref 11.0–15.0)
Total Lymphocyte: 25.3 %
WBC: 6.8 10*3/uL (ref 3.8–10.8)

## 2020-03-11 ENCOUNTER — Other Ambulatory Visit: Payer: Self-pay | Admitting: *Deleted

## 2020-03-11 MED ORDER — NOVOLOG FLEXPEN 100 UNIT/ML ~~LOC~~ SOPN: 6.0000 [IU] | PEN_INJECTOR | Freq: Every day | SUBCUTANEOUS | 5 refills | Status: DC

## 2020-03-15 ENCOUNTER — Other Ambulatory Visit: Payer: Self-pay | Admitting: Family Medicine

## 2020-03-15 DIAGNOSIS — R69 Illness, unspecified: Secondary | ICD-10-CM | POA: Diagnosis not present

## 2020-03-16 DIAGNOSIS — R69 Illness, unspecified: Secondary | ICD-10-CM | POA: Diagnosis not present

## 2020-03-30 ENCOUNTER — Ambulatory Visit: Payer: Medicare HMO | Admitting: Orthopedic Surgery

## 2020-03-30 ENCOUNTER — Other Ambulatory Visit: Payer: Self-pay

## 2020-03-30 ENCOUNTER — Other Ambulatory Visit: Payer: Self-pay | Admitting: Family Medicine

## 2020-03-30 ENCOUNTER — Encounter: Payer: Self-pay | Admitting: Orthopedic Surgery

## 2020-03-30 VITALS — BP 148/44 | HR 77 | Ht 61.0 in | Wt 152.0 lb

## 2020-03-30 DIAGNOSIS — Z9889 Other specified postprocedural states: Secondary | ICD-10-CM | POA: Diagnosis not present

## 2020-03-30 DIAGNOSIS — Z8781 Personal history of (healed) traumatic fracture: Secondary | ICD-10-CM

## 2020-03-30 DIAGNOSIS — S7222XD Displaced subtrochanteric fracture of left femur, subsequent encounter for closed fracture with routine healing: Secondary | ICD-10-CM | POA: Diagnosis not present

## 2020-03-30 NOTE — Progress Notes (Signed)
Chief Complaint  Patient presents with  . Hip Pain    left hip painful / had xray at [facility]     S/p ORIF JUN 2019 S/P LUMBAR FUSION L4/5   86 YO DEMENTIA no complaints but when Dr Buelah Manis did a physical exam patient yelled when back palpated, this prompted xrays and referral.  The px s daughter says she never complains has had several falls but walks with a rolling walker and cane at times  Review of Systems  All other systems reviewed and are negative.   Past Medical History:  Diagnosis Date  . Chronic kidney disease    stage II  . Dementia ([place])   . Diabetes mellitus   . Hyperlipidemia   . Hypertension    Past Surgical History:  Procedure Laterality Date  . ABDOMINAL HYSTERECTOMY    . BACK SURGERY    . INTRAMEDULLARY (IM) NAIL INTERTROCHANTERIC Left 03/28/2018   Procedure: INTRAMEDULLARY (IM) NAIL INTERTROCHANTRIC;  Surgeon: Carole Civil, MD;  Location: [facility];  Service: Orthopedics;  Laterality: Left;    BP (!) 148/44   Pulse 77   Ht 5\' 1"  (1.549 m)   Wt 152 lb (68.9 kg)   BMI 28.72 kg/m   Physical Exam Constitutional:      General: She is not in acute distress.    Appearance: She is normal weight. She is not ill-appearing or diaphoretic.  HENT:     Head: Normocephalic.     Mouth/Throat:     Mouth: Mucous membranes are moist.     Pharynx: No oropharyngeal exudate or posterior oropharyngeal erythema.  Eyes:     General:        Right eye: No discharge.        Left eye: No discharge.     Extraocular Movements: Extraocular movements intact.     Conjunctiva/sclera: Conjunctivae normal.     Pupils: Pupils are equal, round, and reactive to light.  Cardiovascular:     Rate and Rhythm: Normal rate.     Pulses: Normal pulses.  Musculoskeletal:     Lumbar back: Tenderness present. No swelling, edema, deformity or signs of trauma.       Back:     Right hip: Normal. No deformity, tenderness, bony tenderness or crepitus. Normal range of motion.     Left  hip: Normal. No deformity, tenderness, bony tenderness or crepitus. Normal range of motion.     Right lower leg: No edema.     Left lower leg: No edema.     Comments: Tenderness lower back both sides   Neurological:     General: No focal deficit present.     Mental Status: She is disoriented.  Psychiatric:        Mood and Affect: Mood normal.    Encounter Diagnoses  Name Primary?  . S/P ORIF (open reduction internal fixation) fracture IM nail left femur 03/28/18 Yes  . Closed left subtrochanteric femur fracture, with routine healing, subsequent encounter    Independent x-ray interpretation  X-rays were done at the hospital she has an L4-5 lumbar fusion with pedicle screws and neck construct looks normal I believe there is an intervertebral cage as well  Her left hip fracture has a short gamma nail which healed without any complications  X-ray report  IMPRESSION: Degenerative disc and facet disease changes of lumbar spine with prior lower lumbar fusion.   No acute abnormalities.   Aortic Atherosclerosis (ICD10-I70.0).     Electronically Signed  By: Lavonia Dana M.D.   Ms. Wassmer is not complaining of any pain she is not having any functional deficits both surgical constructs look to be normal  Recommend no further treatment   On: 03/09/2020 11:08

## 2020-04-12 ENCOUNTER — Other Ambulatory Visit: Payer: Self-pay | Admitting: Family Medicine

## 2020-04-13 DIAGNOSIS — R69 Illness, unspecified: Secondary | ICD-10-CM | POA: Diagnosis not present

## 2020-04-14 ENCOUNTER — Other Ambulatory Visit: Payer: Self-pay | Admitting: Family Medicine

## 2020-04-14 DIAGNOSIS — R69 Illness, unspecified: Secondary | ICD-10-CM | POA: Diagnosis not present

## 2020-04-16 IMAGING — DX DG HIP (WITH OR WITHOUT PELVIS) 2-3V*L*
3 series · 3 of 3 positions shown · non-contrast
Comparison: None.

CLINICAL DATA: Unwitnessed fall at home today.  Pain.

EXAM:
DG HIP (WITH OR WITHOUT PELVIS) 2-3V LEFT

[hip ap]
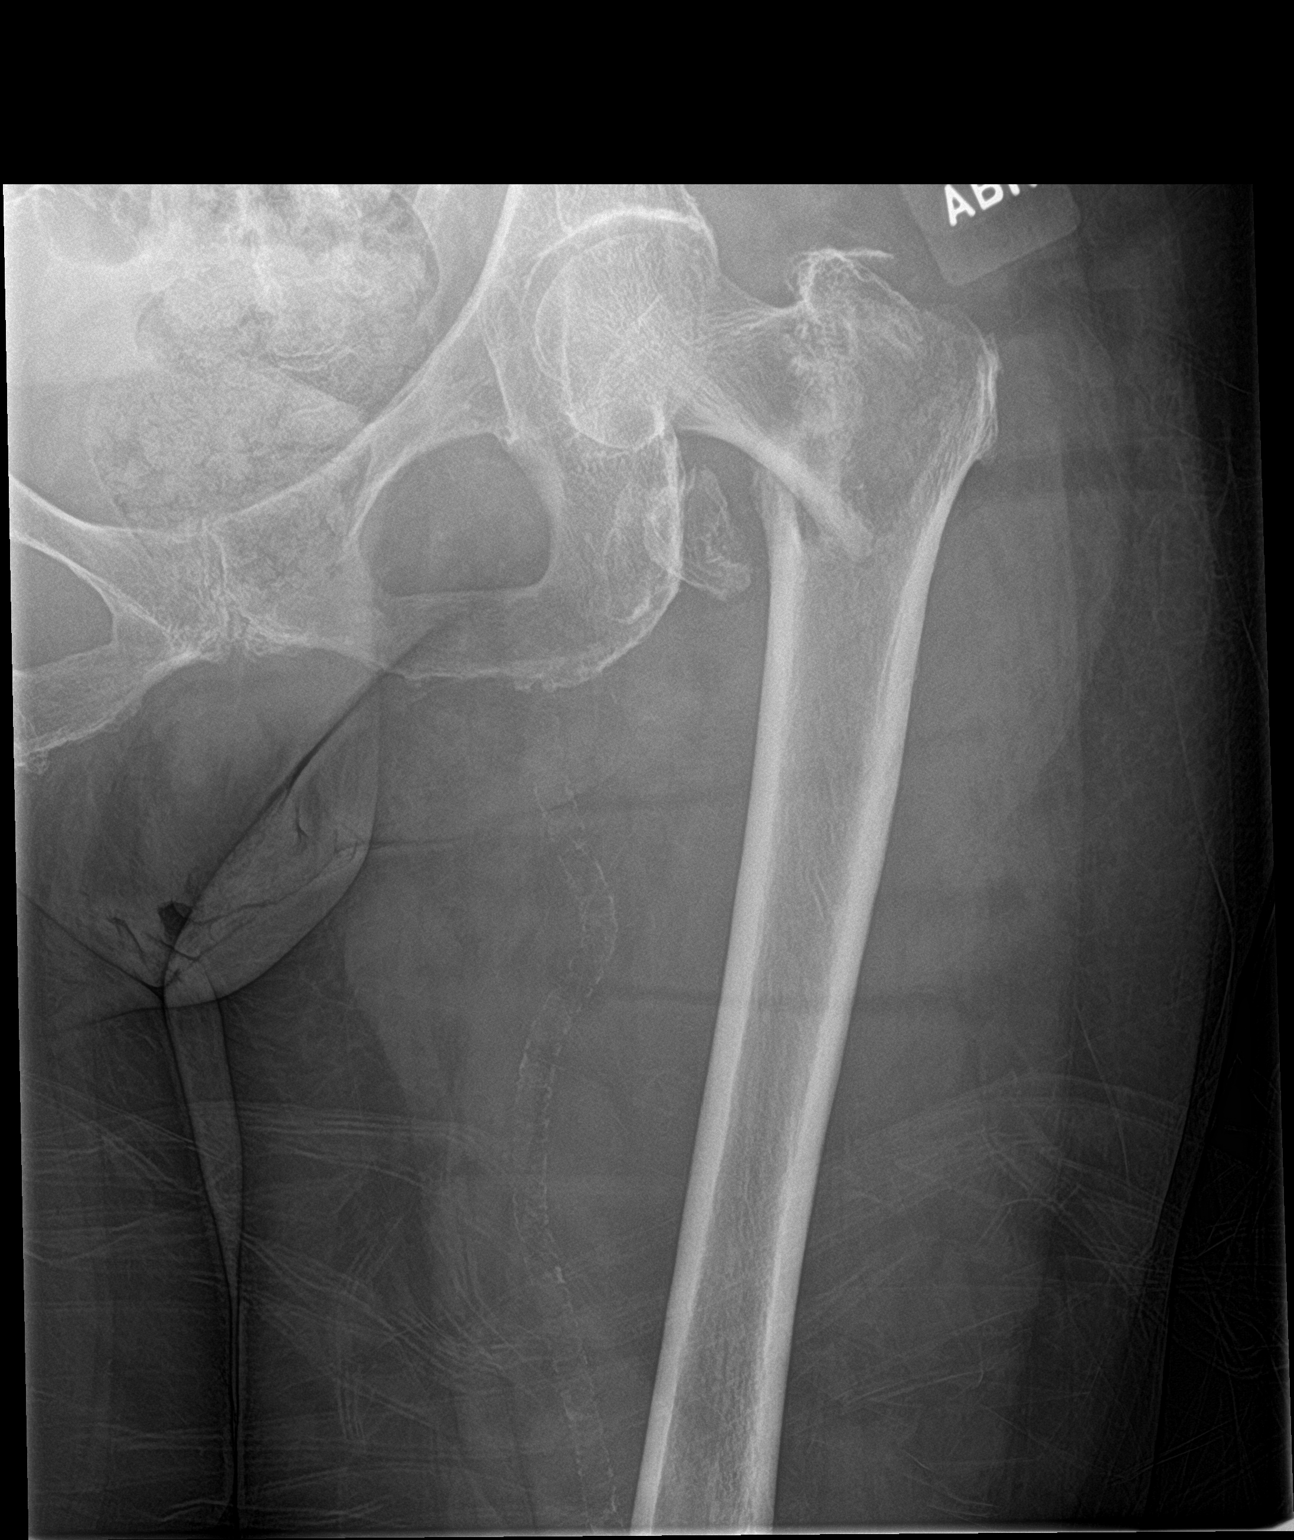

[pelvis ap]
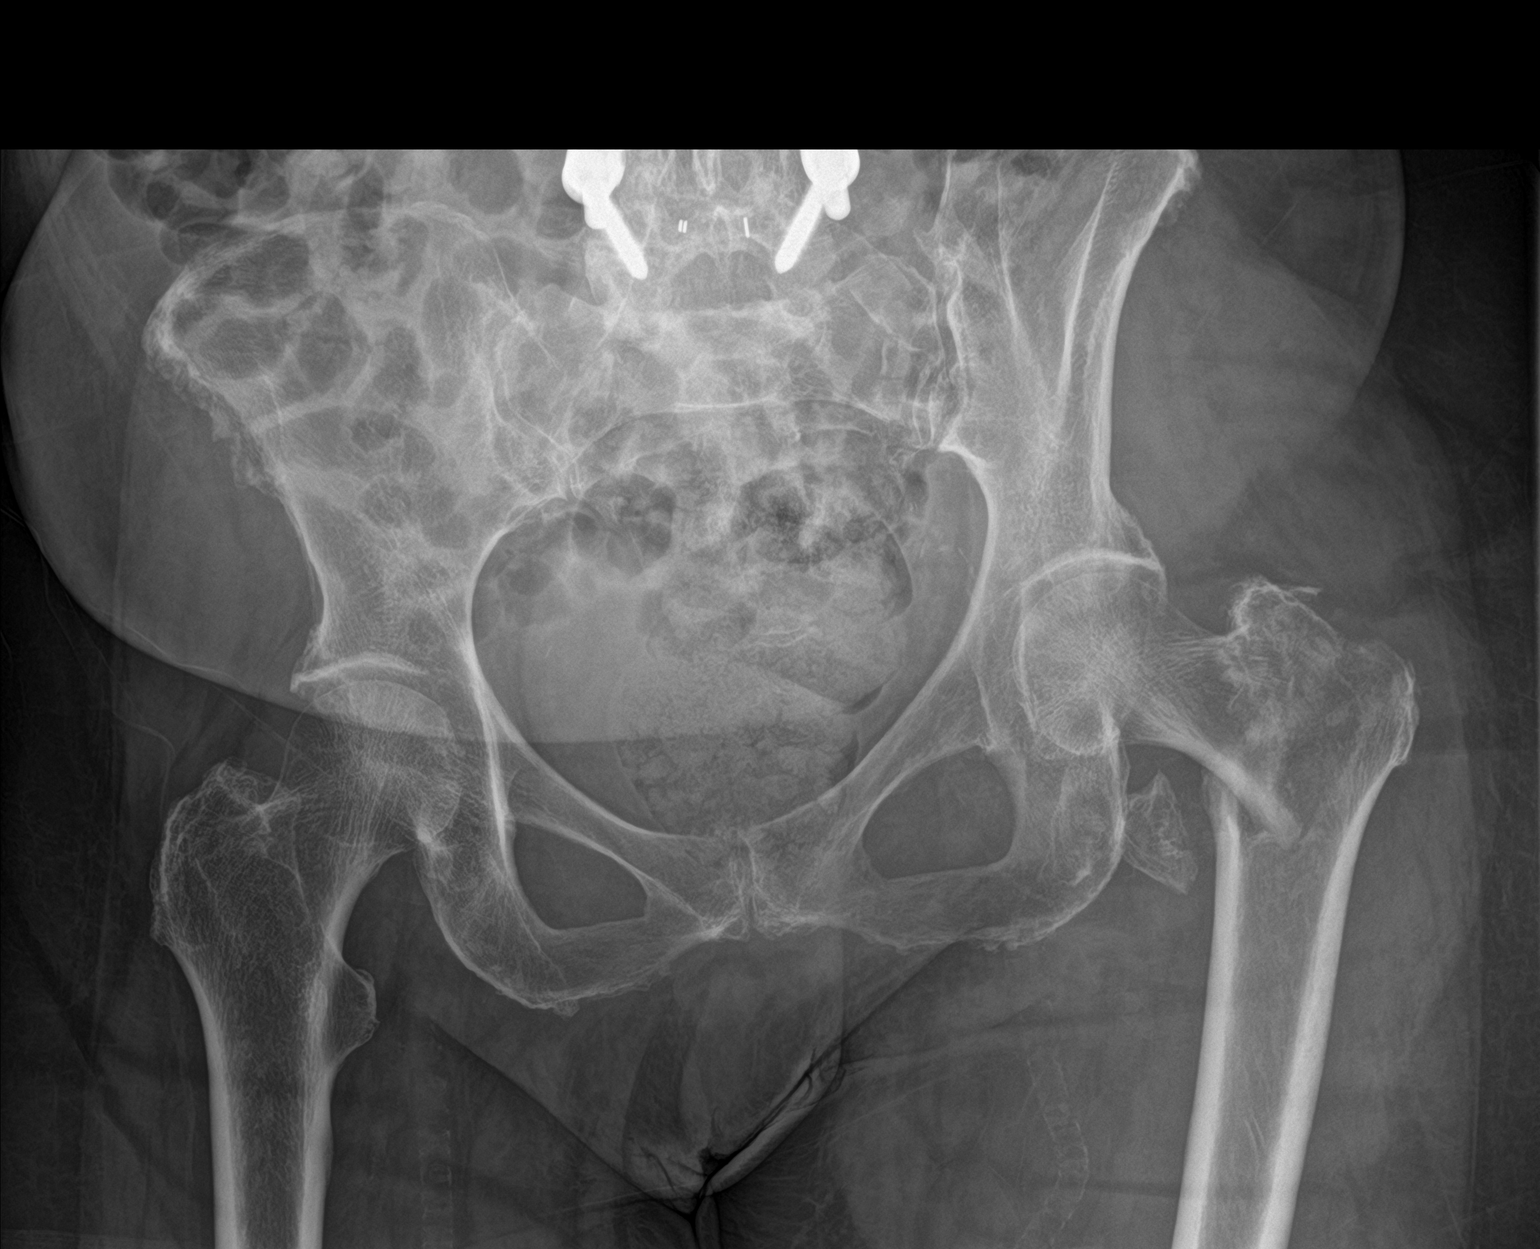

[hip lat]
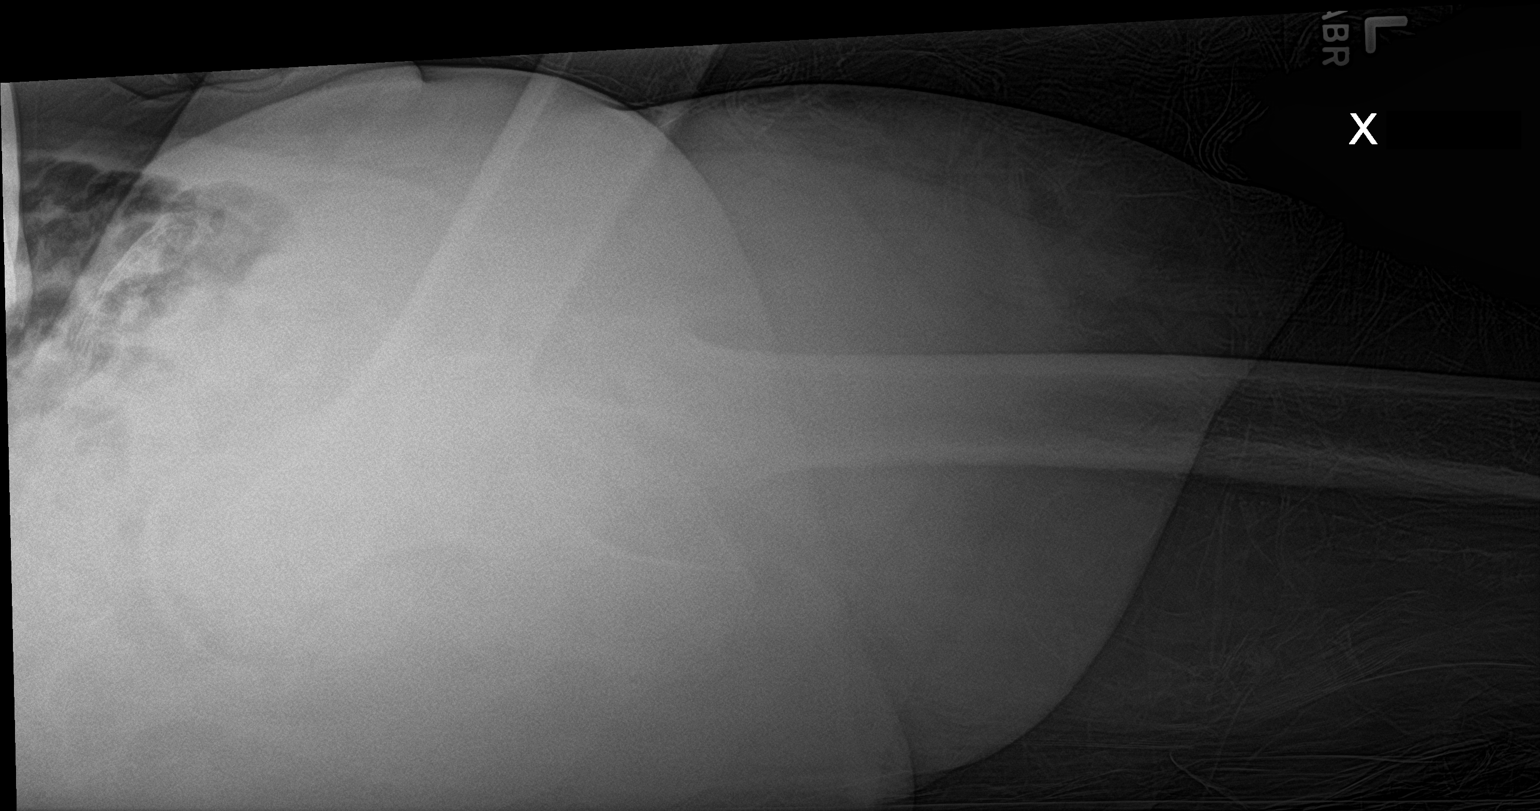

[3 of 3 positions shown; findings below may reference images not displayed]

FINDINGS: Acute, varus angulated intertrochanteric fracture of the left femur
with avulsed lesser trochanter. Status post L4-5 lumbar fusion with
hardware in place. No acute pelvic fracture. The native right hip
appears intact.
IMPRESSION: Acute, varus angulated intertrochanteric fracture of the left femur
with avulsed lesser trochanter.

## 2020-04-16 IMAGING — DX DG CHEST 1V
1 series · 1 of 1 positions shown · non-contrast
Comparison: 01/11/2016

CLINICAL DATA: Unwitnessed fall at home.  Preop left hip fracture.

EXAM:
CHEST  1 VIEW

[chest pa]
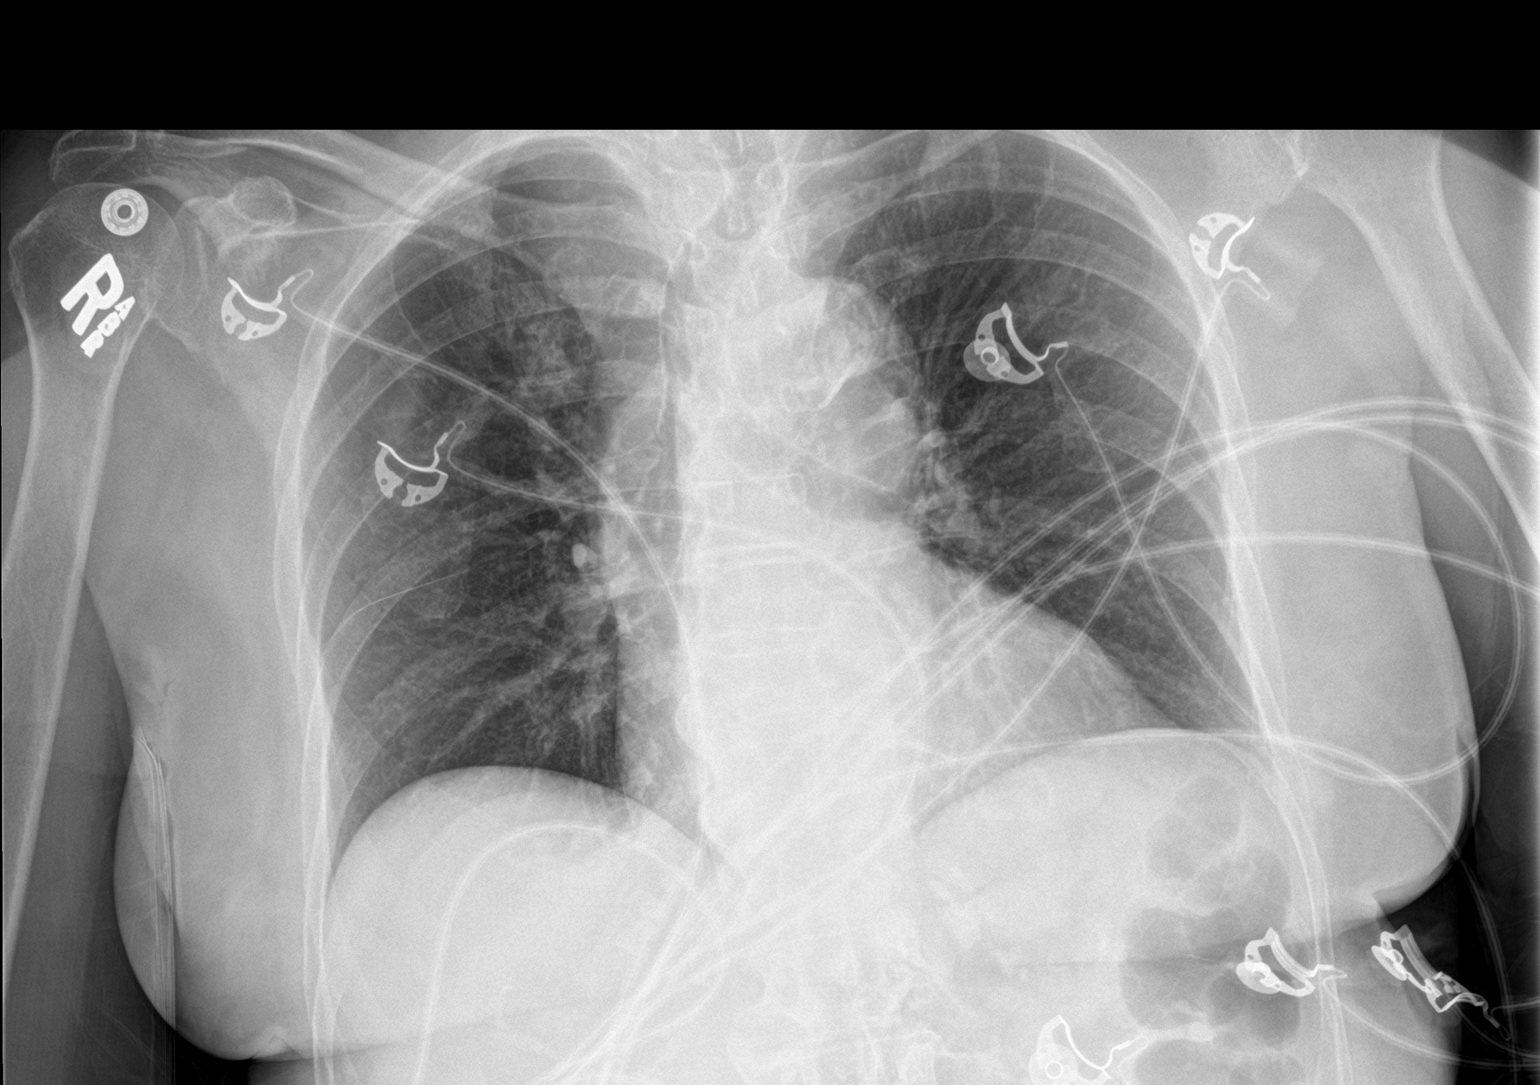

[1 of 1 positions shown; findings below may reference images not displayed]

FINDINGS: Heart is top-normal in size. Moderate aortic atherosclerosis with
ectasia. Clear lungs. No acute osseous abnormality.
IMPRESSION: Aortic atherosclerosis.  No active pulmonary disease.

## 2020-04-17 ENCOUNTER — Telehealth: Payer: Self-pay | Admitting: *Deleted

## 2020-04-17 NOTE — Telephone Encounter (Signed)
Received multiple forms from patient son, Clayvon.   Received annual check list for [facility]. Forms routed to provider.   Patient son also reports that he requires FL2 as he is looking into placement for patient. FL2 for both ALF and SNF completed.   Patient son also requires update on [name] for care of patient. Completed [name] and routed to provider.

## 2020-04-17 NOTE — Telephone Encounter (Signed)
Forms completed

## 2020-04-18 IMAGING — RF DG HIP (WITH PELVIS) OPERATIVE*L*
1 series · 9 of 9 positions shown · non-contrast
Comparison: Radiographs March 26, 2018.

CLINICAL DATA: Left hip fracture.

EXAM:
OPERATIVE left HIP (WITH PELVIS IF PERFORMED) 8 VIEWS
TECHNIQUE: Fluoroscopic spot image(s) were submitted for interpretation
post-operatively.
Radiation exposure index: 21.9 mGy.

[Series 1: run · 9 of 9 slices shown]
[im 1/9]
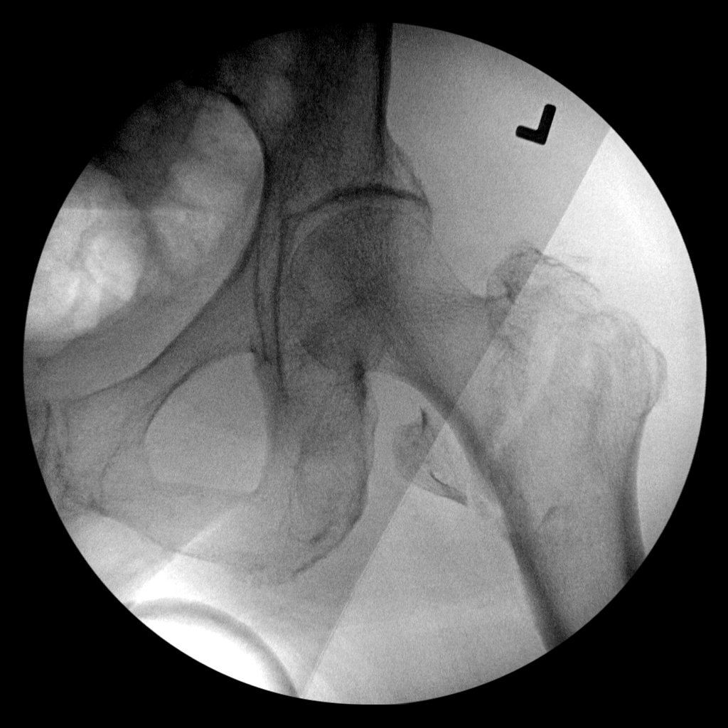
[im 2/9]
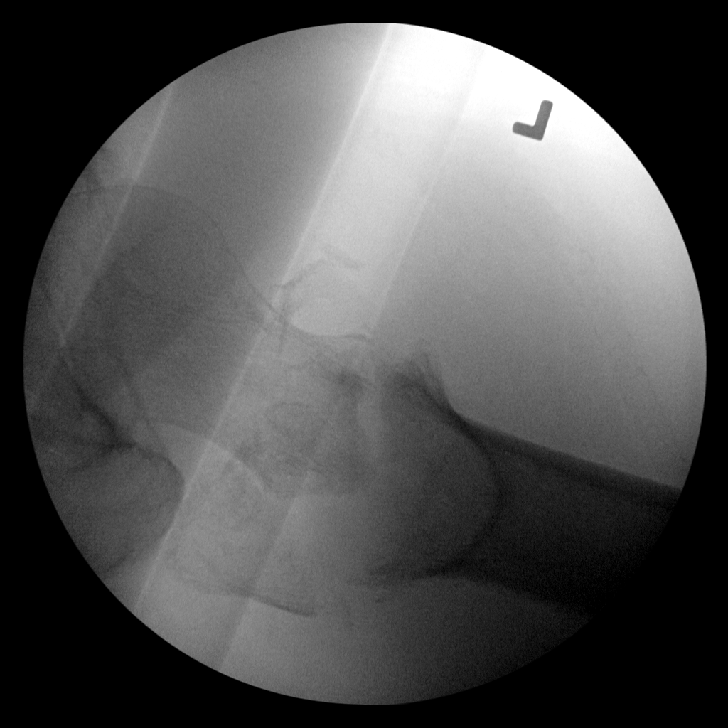
[im 3/9]
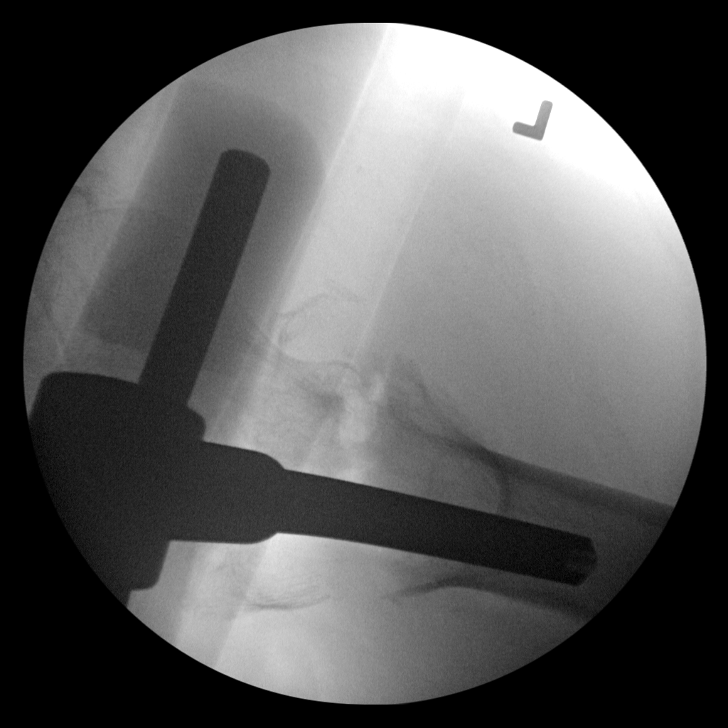
[im 4/9]
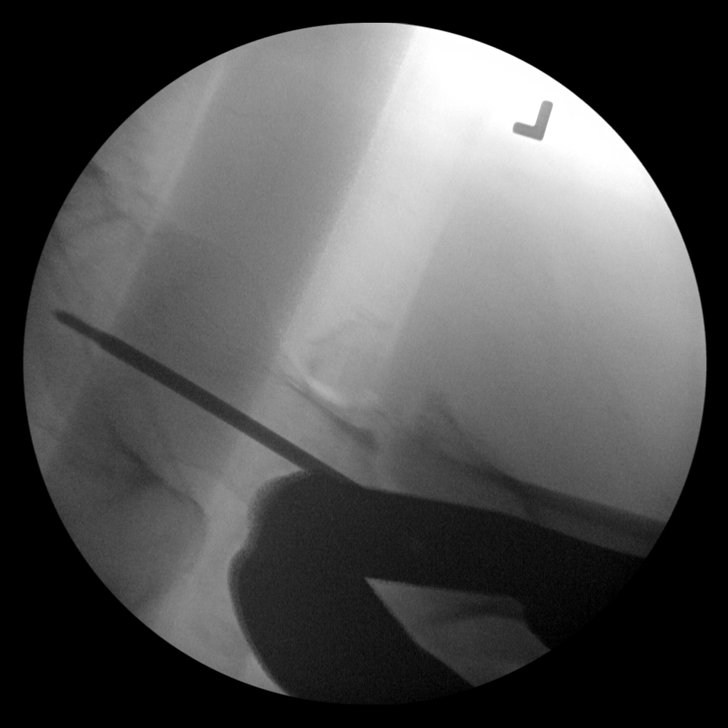
[im 5/9]
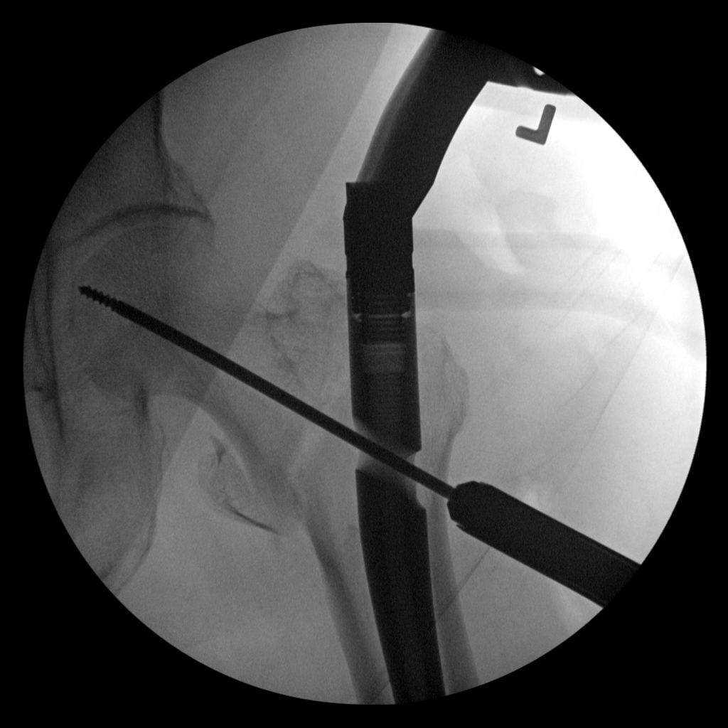
[im 6/9]
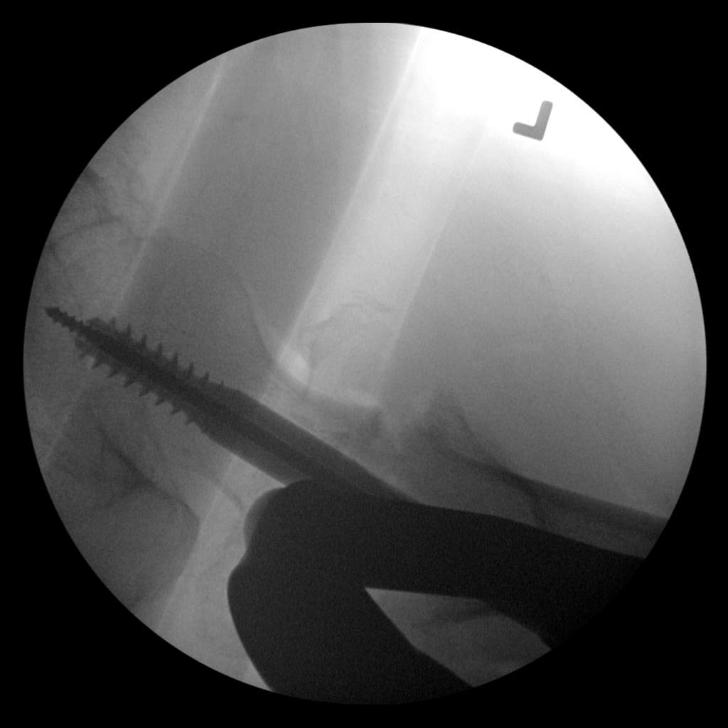
[im 7/9]
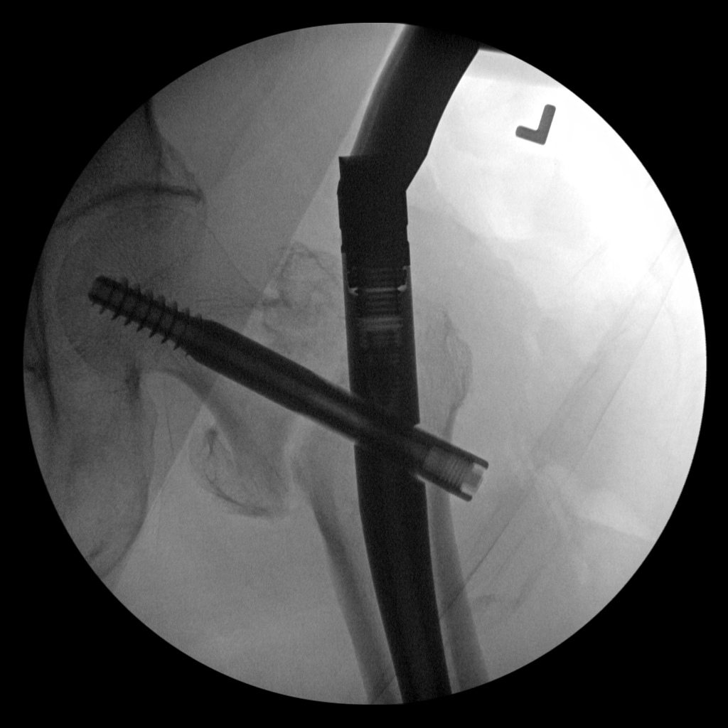
[im 8/9]
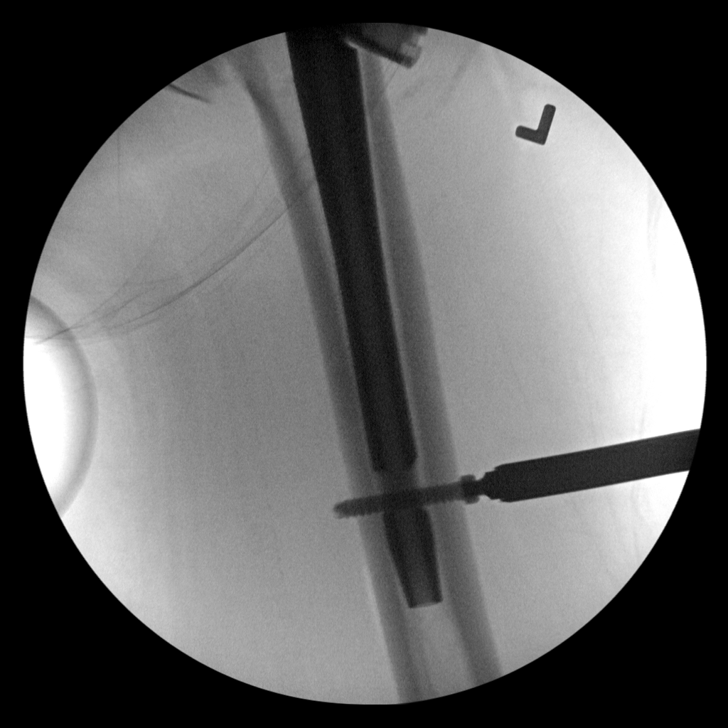
[im 9/9]
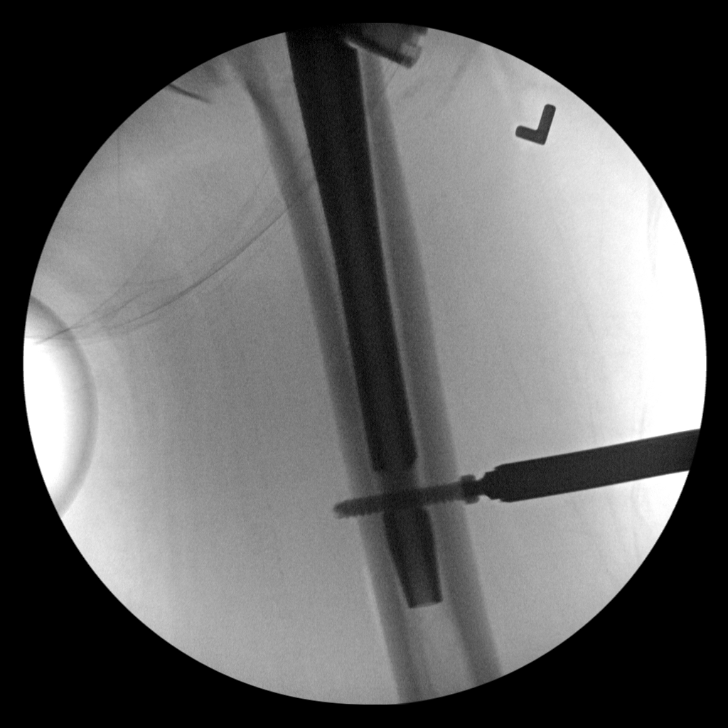

[9 of 9 positions shown; findings below may reference images not displayed]

FINDINGS: Eight fluoroscopic images of the left hip demonstrate the patient be
status post surgical internal fixation of intertrochanteric fracture
of proximal left femur.
IMPRESSION: Status post surgical internal fixation of proximal left femoral
intertrochanteric fracture.

## 2020-04-20 NOTE — Telephone Encounter (Signed)
Patient son Yvone Neu made aware to come to office to pick up and pay simple form fee ($20.00)

## 2020-04-22 ENCOUNTER — Telehealth: Payer: Self-pay | Admitting: *Deleted

## 2020-04-22 NOTE — Telephone Encounter (Signed)
Received call from patient son, Clayvon.   Requested that MD remove the "wandering" off of FL2 for [facility]   Reports that patient only had 1 episode of wandering [place].   Advised that FL2 cannot be changed until PCP returns to office on 04/27/2020.

## 2020-04-26 NOTE — Telephone Encounter (Signed)
Noted form can be changed

## 2020-05-01 ENCOUNTER — Telehealth: Payer: Self-pay | Admitting: *Deleted

## 2020-05-01 DIAGNOSIS — F0150 Vascular dementia without behavioral disturbance: Secondary | ICD-10-CM

## 2020-05-01 NOTE — Telephone Encounter (Signed)
Received call from patient son Clayvon [phone]) [phone]~ telephone.  Reports that he is in the process of finding placement in SNF for patient. Reports that [facility] has availability, but they have concerns about the primary Dx: Vascular dementia with behavior disturbance ([place])   Codes: F01.51   Requested to have the wording "with behavior disturbance" removed.   Call placed to [facility], [phone]~ telephone/ [phone]~ fax. Discussed issue with Tammy in admissions. Reports that she is unable to offer placement at this time if patient is known to have behavior disturbances. Removing the actual wording "with behavior disturbance" from the [facility] will not be acceptable if the Dx: F01.51 remains.   MD please advise.

## 2020-05-01 NOTE — Telephone Encounter (Signed)
Chart updated with correct code Pt is a good candidate for SNF

## 2020-05-01 NOTE — Telephone Encounter (Signed)
Pt is not having any behavioral issues over the 6 months or more No further wandering, mood controlled with medications

## 2020-05-04 DIAGNOSIS — B351 Tinea unguium: Secondary | ICD-10-CM | POA: Diagnosis not present

## 2020-05-04 DIAGNOSIS — E1151 Type 2 diabetes mellitus with diabetic peripheral angiopathy without gangrene: Secondary | ICD-10-CM | POA: Diagnosis not present

## 2020-05-07 ENCOUNTER — Telehealth: Payer: Self-pay | Admitting: *Deleted

## 2020-05-07 NOTE — Telephone Encounter (Signed)
Received call from patient son, Clayvon.   Reports that [facility] could not accept patient due to Dx of dementia.   Requested to have new FL2 sent to [facility] in [place]. Faxed FL2 to requested place.   Also requested new undated FL2 without "behaviors" listed as dx.

## 2020-05-08 NOTE — Telephone Encounter (Signed)
I fixed the dementia diagnosis on the FL2 before OKAY TO Fax another form

## 2020-05-11 ENCOUNTER — Other Ambulatory Visit: Payer: Self-pay | Admitting: Family Medicine

## 2020-05-11 DIAGNOSIS — R69 Illness, unspecified: Secondary | ICD-10-CM | POA: Diagnosis not present

## 2020-05-19 ENCOUNTER — Other Ambulatory Visit: Payer: Self-pay | Admitting: Family Medicine

## 2020-05-20 NOTE — Telephone Encounter (Signed)
Requested Prescriptions   Pending Prescriptions Disp Refills   ALPRAZolam (XANAX) 0.25 MG tablet [Pharmacy Med Name: ALPRAZOLAM 0.25MG  TABLETS] 30 tablet 0    Sig: TAKE 1 TABLET(0.25 MG) BY MOUTH AT BEDTIME AS NEEDED     Last OV 03/09/2020   Last written 07/01/2019

## 2020-06-02 DIAGNOSIS — Z9181 History of falling: Secondary | ICD-10-CM | POA: Diagnosis not present

## 2020-06-02 DIAGNOSIS — R278 Other lack of coordination: Secondary | ICD-10-CM | POA: Diagnosis not present

## 2020-06-02 DIAGNOSIS — R69 Illness, unspecified: Secondary | ICD-10-CM | POA: Diagnosis not present

## 2020-06-02 DIAGNOSIS — R41841 Cognitive communication deficit: Secondary | ICD-10-CM | POA: Diagnosis not present

## 2020-06-02 DIAGNOSIS — E441 Mild protein-calorie malnutrition: Secondary | ICD-10-CM | POA: Diagnosis not present

## 2020-06-02 DIAGNOSIS — R1319 Other dysphagia: Secondary | ICD-10-CM | POA: Diagnosis not present

## 2020-06-02 DIAGNOSIS — R634 Abnormal weight loss: Secondary | ICD-10-CM | POA: Diagnosis not present

## 2020-06-03 DIAGNOSIS — R634 Abnormal weight loss: Secondary | ICD-10-CM | POA: Diagnosis not present

## 2020-06-03 DIAGNOSIS — Z9181 History of falling: Secondary | ICD-10-CM | POA: Diagnosis not present

## 2020-06-03 DIAGNOSIS — R278 Other lack of coordination: Secondary | ICD-10-CM | POA: Diagnosis not present

## 2020-06-03 DIAGNOSIS — R41841 Cognitive communication deficit: Secondary | ICD-10-CM | POA: Diagnosis not present

## 2020-06-03 DIAGNOSIS — R1319 Other dysphagia: Secondary | ICD-10-CM | POA: Diagnosis not present

## 2020-06-03 DIAGNOSIS — E441 Mild protein-calorie malnutrition: Secondary | ICD-10-CM | POA: Diagnosis not present

## 2020-06-03 DIAGNOSIS — R69 Illness, unspecified: Secondary | ICD-10-CM | POA: Diagnosis not present

## 2020-06-04 DIAGNOSIS — E119 Type 2 diabetes mellitus without complications: Secondary | ICD-10-CM | POA: Diagnosis not present

## 2020-06-04 DIAGNOSIS — R69 Illness, unspecified: Secondary | ICD-10-CM | POA: Diagnosis not present

## 2020-06-04 DIAGNOSIS — N183 Chronic kidney disease, stage 3 unspecified: Secondary | ICD-10-CM | POA: Diagnosis not present

## 2020-06-04 DIAGNOSIS — I7090 Unspecified atherosclerosis: Secondary | ICD-10-CM | POA: Diagnosis not present

## 2020-06-06 DIAGNOSIS — R1319 Other dysphagia: Secondary | ICD-10-CM | POA: Diagnosis not present

## 2020-06-06 DIAGNOSIS — R278 Other lack of coordination: Secondary | ICD-10-CM | POA: Diagnosis not present

## 2020-06-06 DIAGNOSIS — R41841 Cognitive communication deficit: Secondary | ICD-10-CM | POA: Diagnosis not present

## 2020-06-06 DIAGNOSIS — R69 Illness, unspecified: Secondary | ICD-10-CM | POA: Diagnosis not present

## 2020-06-06 DIAGNOSIS — R634 Abnormal weight loss: Secondary | ICD-10-CM | POA: Diagnosis not present

## 2020-06-06 DIAGNOSIS — E441 Mild protein-calorie malnutrition: Secondary | ICD-10-CM | POA: Diagnosis not present

## 2020-06-06 DIAGNOSIS — Z9181 History of falling: Secondary | ICD-10-CM | POA: Diagnosis not present

## 2020-06-08 DIAGNOSIS — R634 Abnormal weight loss: Secondary | ICD-10-CM | POA: Diagnosis not present

## 2020-06-08 DIAGNOSIS — R1319 Other dysphagia: Secondary | ICD-10-CM | POA: Diagnosis not present

## 2020-06-08 DIAGNOSIS — E441 Mild protein-calorie malnutrition: Secondary | ICD-10-CM | POA: Diagnosis not present

## 2020-06-08 DIAGNOSIS — R41841 Cognitive communication deficit: Secondary | ICD-10-CM | POA: Diagnosis not present

## 2020-06-08 DIAGNOSIS — R278 Other lack of coordination: Secondary | ICD-10-CM | POA: Diagnosis not present

## 2020-06-08 DIAGNOSIS — R69 Illness, unspecified: Secondary | ICD-10-CM | POA: Diagnosis not present

## 2020-06-08 DIAGNOSIS — Z9181 History of falling: Secondary | ICD-10-CM | POA: Diagnosis not present

## 2020-06-09 DIAGNOSIS — R2681 Unsteadiness on feet: Secondary | ICD-10-CM | POA: Diagnosis not present

## 2020-06-09 DIAGNOSIS — R262 Difficulty in walking, not elsewhere classified: Secondary | ICD-10-CM | POA: Diagnosis not present

## 2020-06-09 DIAGNOSIS — M6281 Muscle weakness (generalized): Secondary | ICD-10-CM | POA: Diagnosis not present

## 2020-06-09 DIAGNOSIS — U071 COVID-19: Secondary | ICD-10-CM | POA: Diagnosis not present

## 2020-06-09 DIAGNOSIS — J1282 Pneumonia due to coronavirus disease 2019: Secondary | ICD-10-CM | POA: Diagnosis not present

## 2020-06-10 DIAGNOSIS — R2681 Unsteadiness on feet: Secondary | ICD-10-CM | POA: Diagnosis not present

## 2020-06-10 DIAGNOSIS — U071 COVID-19: Secondary | ICD-10-CM | POA: Diagnosis not present

## 2020-06-10 DIAGNOSIS — R69 Illness, unspecified: Secondary | ICD-10-CM | POA: Diagnosis not present

## 2020-06-10 DIAGNOSIS — F0390 Unspecified dementia without behavioral disturbance: Secondary | ICD-10-CM | POA: Diagnosis not present

## 2020-06-10 DIAGNOSIS — D649 Anemia, unspecified: Secondary | ICD-10-CM | POA: Diagnosis not present

## 2020-06-10 DIAGNOSIS — E1140 Type 2 diabetes mellitus with diabetic neuropathy, unspecified: Secondary | ICD-10-CM | POA: Diagnosis not present

## 2020-06-10 DIAGNOSIS — R262 Difficulty in walking, not elsewhere classified: Secondary | ICD-10-CM | POA: Diagnosis not present

## 2020-06-10 DIAGNOSIS — M6281 Muscle weakness (generalized): Secondary | ICD-10-CM | POA: Diagnosis not present

## 2020-06-11 DIAGNOSIS — R262 Difficulty in walking, not elsewhere classified: Secondary | ICD-10-CM | POA: Diagnosis not present

## 2020-06-11 DIAGNOSIS — U071 COVID-19: Secondary | ICD-10-CM | POA: Diagnosis not present

## 2020-06-11 DIAGNOSIS — M6281 Muscle weakness (generalized): Secondary | ICD-10-CM | POA: Diagnosis not present

## 2020-06-11 DIAGNOSIS — R2681 Unsteadiness on feet: Secondary | ICD-10-CM | POA: Diagnosis not present

## 2020-06-12 DIAGNOSIS — R2681 Unsteadiness on feet: Secondary | ICD-10-CM | POA: Diagnosis not present

## 2020-06-12 DIAGNOSIS — M6281 Muscle weakness (generalized): Secondary | ICD-10-CM | POA: Diagnosis not present

## 2020-06-12 DIAGNOSIS — R262 Difficulty in walking, not elsewhere classified: Secondary | ICD-10-CM | POA: Diagnosis not present

## 2020-06-12 DIAGNOSIS — U071 COVID-19: Secondary | ICD-10-CM | POA: Diagnosis not present

## 2020-06-14 DIAGNOSIS — Z03818 Encounter for observation for suspected exposure to other biological agents ruled out: Secondary | ICD-10-CM | POA: Diagnosis not present

## 2020-06-15 DIAGNOSIS — R2681 Unsteadiness on feet: Secondary | ICD-10-CM | POA: Diagnosis not present

## 2020-06-15 DIAGNOSIS — U071 COVID-19: Secondary | ICD-10-CM | POA: Diagnosis not present

## 2020-06-15 DIAGNOSIS — R69 Illness, unspecified: Secondary | ICD-10-CM | POA: Diagnosis not present

## 2020-06-15 DIAGNOSIS — R262 Difficulty in walking, not elsewhere classified: Secondary | ICD-10-CM | POA: Diagnosis not present

## 2020-06-15 DIAGNOSIS — M6281 Muscle weakness (generalized): Secondary | ICD-10-CM | POA: Diagnosis not present

## 2020-06-16 DIAGNOSIS — U071 COVID-19: Secondary | ICD-10-CM | POA: Diagnosis not present

## 2020-06-16 DIAGNOSIS — R262 Difficulty in walking, not elsewhere classified: Secondary | ICD-10-CM | POA: Diagnosis not present

## 2020-06-16 DIAGNOSIS — M6281 Muscle weakness (generalized): Secondary | ICD-10-CM | POA: Diagnosis not present

## 2020-06-16 DIAGNOSIS — R2681 Unsteadiness on feet: Secondary | ICD-10-CM | POA: Diagnosis not present

## 2020-06-17 DIAGNOSIS — U071 COVID-19: Secondary | ICD-10-CM | POA: Diagnosis not present

## 2020-06-17 DIAGNOSIS — R2681 Unsteadiness on feet: Secondary | ICD-10-CM | POA: Diagnosis not present

## 2020-06-17 DIAGNOSIS — M6281 Muscle weakness (generalized): Secondary | ICD-10-CM | POA: Diagnosis not present

## 2020-06-17 DIAGNOSIS — R262 Difficulty in walking, not elsewhere classified: Secondary | ICD-10-CM | POA: Diagnosis not present

## 2020-06-18 DIAGNOSIS — R2681 Unsteadiness on feet: Secondary | ICD-10-CM | POA: Diagnosis not present

## 2020-06-18 DIAGNOSIS — U071 COVID-19: Secondary | ICD-10-CM | POA: Diagnosis not present

## 2020-06-18 DIAGNOSIS — M6281 Muscle weakness (generalized): Secondary | ICD-10-CM | POA: Diagnosis not present

## 2020-06-18 DIAGNOSIS — R262 Difficulty in walking, not elsewhere classified: Secondary | ICD-10-CM | POA: Diagnosis not present

## 2020-06-19 DIAGNOSIS — R262 Difficulty in walking, not elsewhere classified: Secondary | ICD-10-CM | POA: Diagnosis not present

## 2020-06-19 DIAGNOSIS — U071 COVID-19: Secondary | ICD-10-CM | POA: Diagnosis not present

## 2020-06-19 DIAGNOSIS — M6281 Muscle weakness (generalized): Secondary | ICD-10-CM | POA: Diagnosis not present

## 2020-06-19 DIAGNOSIS — R2681 Unsteadiness on feet: Secondary | ICD-10-CM | POA: Diagnosis not present

## 2020-06-22 DIAGNOSIS — R062 Wheezing: Secondary | ICD-10-CM | POA: Diagnosis not present

## 2020-06-22 DIAGNOSIS — M6281 Muscle weakness (generalized): Secondary | ICD-10-CM | POA: Diagnosis not present

## 2020-06-22 DIAGNOSIS — R2681 Unsteadiness on feet: Secondary | ICD-10-CM | POA: Diagnosis not present

## 2020-06-22 DIAGNOSIS — R05 Cough: Secondary | ICD-10-CM | POA: Diagnosis not present

## 2020-06-22 DIAGNOSIS — U071 COVID-19: Secondary | ICD-10-CM | POA: Diagnosis not present

## 2020-06-22 DIAGNOSIS — E1129 Type 2 diabetes mellitus with other diabetic kidney complication: Secondary | ICD-10-CM | POA: Diagnosis not present

## 2020-06-22 DIAGNOSIS — I1310 Hypertensive heart and chronic kidney disease without heart failure, with stage 1 through stage 4 chronic kidney disease, or unspecified chronic kidney disease: Secondary | ICD-10-CM | POA: Diagnosis not present

## 2020-06-22 DIAGNOSIS — R262 Difficulty in walking, not elsewhere classified: Secondary | ICD-10-CM | POA: Diagnosis not present

## 2020-06-22 DIAGNOSIS — N1832 Chronic kidney disease, stage 3b: Secondary | ICD-10-CM | POA: Diagnosis not present

## 2020-06-23 DIAGNOSIS — U071 COVID-19: Secondary | ICD-10-CM | POA: Diagnosis not present

## 2020-06-23 DIAGNOSIS — R262 Difficulty in walking, not elsewhere classified: Secondary | ICD-10-CM | POA: Diagnosis not present

## 2020-06-23 DIAGNOSIS — M6281 Muscle weakness (generalized): Secondary | ICD-10-CM | POA: Diagnosis not present

## 2020-06-23 DIAGNOSIS — R2681 Unsteadiness on feet: Secondary | ICD-10-CM | POA: Diagnosis not present

## 2020-06-24 DIAGNOSIS — R69 Illness, unspecified: Secondary | ICD-10-CM | POA: Diagnosis not present

## 2020-06-24 DIAGNOSIS — M6281 Muscle weakness (generalized): Secondary | ICD-10-CM | POA: Diagnosis not present

## 2020-06-24 DIAGNOSIS — U071 COVID-19: Secondary | ICD-10-CM | POA: Diagnosis not present

## 2020-06-25 ENCOUNTER — Inpatient Hospital Stay (HOSPITAL_COMMUNITY)
Admission: EM | Admit: 2020-06-25 | Discharge: 2020-06-27 | DRG: 682 | Disposition: A | Discharge status: Nursing Home (Includes ICF) | Payer: Medicare HMO | Source: Skilled Nursing Facility | Attending: Internal Medicine | Admitting: Internal Medicine

## 2020-06-25 ENCOUNTER — Encounter (HOSPITAL_COMMUNITY): Payer: Self-pay | Admitting: Internal Medicine

## 2020-06-25 DIAGNOSIS — I5189 Other ill-defined heart diseases: Secondary | ICD-10-CM

## 2020-06-25 DIAGNOSIS — I959 Hypotension, unspecified: Secondary | ICD-10-CM | POA: Diagnosis not present

## 2020-06-25 DIAGNOSIS — Z79899 Other long term (current) drug therapy: Secondary | ICD-10-CM

## 2020-06-25 DIAGNOSIS — N183 Chronic kidney disease, stage 3 unspecified: Secondary | ICD-10-CM | POA: Diagnosis present

## 2020-06-25 DIAGNOSIS — R69 Illness, unspecified: Secondary | ICD-10-CM | POA: Diagnosis not present

## 2020-06-25 DIAGNOSIS — E860 Dehydration: Secondary | ICD-10-CM | POA: Diagnosis present

## 2020-06-25 DIAGNOSIS — G9341 Metabolic encephalopathy: Secondary | ICD-10-CM | POA: Diagnosis present

## 2020-06-25 DIAGNOSIS — I10 Essential (primary) hypertension: Secondary | ICD-10-CM | POA: Diagnosis present

## 2020-06-25 DIAGNOSIS — I509 Heart failure, unspecified: Secondary | ICD-10-CM | POA: Diagnosis present

## 2020-06-25 DIAGNOSIS — E1165 Type 2 diabetes mellitus with hyperglycemia: Secondary | ICD-10-CM | POA: Diagnosis present

## 2020-06-25 DIAGNOSIS — E1129 Type 2 diabetes mellitus with other diabetic kidney complication: Secondary | ICD-10-CM | POA: Diagnosis present

## 2020-06-25 DIAGNOSIS — U071 COVID-19: Secondary | ICD-10-CM | POA: Diagnosis not present

## 2020-06-25 DIAGNOSIS — N179 Acute kidney failure, unspecified: Principal | ICD-10-CM | POA: Diagnosis present

## 2020-06-25 DIAGNOSIS — Z8249 Family history of ischemic heart disease and other diseases of the circulatory system: Secondary | ICD-10-CM

## 2020-06-25 DIAGNOSIS — N2889 Other specified disorders of kidney and ureter: Secondary | ICD-10-CM | POA: Diagnosis present

## 2020-06-25 DIAGNOSIS — R404 Transient alteration of awareness: Secondary | ICD-10-CM | POA: Diagnosis not present

## 2020-06-25 DIAGNOSIS — M6281 Muscle weakness (generalized): Secondary | ICD-10-CM | POA: Diagnosis not present

## 2020-06-25 DIAGNOSIS — I130 Hypertensive heart and chronic kidney disease with heart failure and stage 1 through stage 4 chronic kidney disease, or unspecified chronic kidney disease: Secondary | ICD-10-CM | POA: Diagnosis present

## 2020-06-25 DIAGNOSIS — E785 Hyperlipidemia, unspecified: Secondary | ICD-10-CM | POA: Diagnosis present

## 2020-06-25 DIAGNOSIS — D649 Anemia, unspecified: Secondary | ICD-10-CM | POA: Diagnosis not present

## 2020-06-25 DIAGNOSIS — Z8262 Family history of osteoporosis: Secondary | ICD-10-CM

## 2020-06-25 DIAGNOSIS — R4182 Altered mental status, unspecified: Secondary | ICD-10-CM | POA: Diagnosis not present

## 2020-06-25 DIAGNOSIS — F0390 Unspecified dementia without behavioral disturbance: Secondary | ICD-10-CM | POA: Diagnosis present

## 2020-06-25 DIAGNOSIS — Z794 Long term (current) use of insulin: Secondary | ICD-10-CM

## 2020-06-25 DIAGNOSIS — Z803 Family history of malignant neoplasm of breast: Secondary | ICD-10-CM

## 2020-06-25 DIAGNOSIS — Z90710 Acquired absence of both cervix and uterus: Secondary | ICD-10-CM

## 2020-06-25 DIAGNOSIS — E1122 Type 2 diabetes mellitus with diabetic chronic kidney disease: Secondary | ICD-10-CM | POA: Diagnosis present

## 2020-06-25 HISTORY — DX: Other ill-defined heart diseases: I51.89

## 2020-06-25 LAB — CBC WITH DIFFERENTIAL/PLATELET
Abs Immature Granulocytes: 0.04 10*3/uL (ref 0.00–0.07)
Basophils Absolute: 0 10*3/uL (ref 0.0–0.1)
Basophils Relative: 0 %
Eosinophils Absolute: 0 10*3/uL (ref 0.0–0.5)
Eosinophils Relative: 0 %
HCT: 39.9 % (ref 36.0–46.0)
Hemoglobin: 12.3 g/dL (ref 12.0–15.0)
Immature Granulocytes: 1 %
Lymphocytes Relative: 10 %
Lymphs Abs: 0.9 10*3/uL (ref 0.7–4.0)
MCH: 26.6 pg (ref 26.0–34.0)
MCHC: 30.8 g/dL (ref 30.0–36.0)
MCV: 86.2 fL (ref 80.0–100.0)
Monocytes Absolute: 0.7 10*3/uL (ref 0.1–1.0)
Monocytes Relative: 8 %
Neutro Abs: 7.2 10*3/uL (ref 1.7–7.7)
Neutrophils Relative %: 81 %
Platelets: 226 10*3/uL (ref 150–400)
RBC: 4.63 MIL/uL (ref 3.87–5.11)
RDW: 14 % (ref 11.5–15.5)
WBC: 8.9 10*3/uL (ref 4.0–10.5)
nRBC: 0 % (ref 0.0–0.2)

## 2020-06-25 LAB — URINALYSIS, ROUTINE W REFLEX MICROSCOPIC
Bilirubin Urine: NEGATIVE
Glucose, UA: NEGATIVE mg/dL
Hgb urine dipstick: NEGATIVE
Ketones, ur: 5 mg/dL — AB
Leukocytes,Ua: NEGATIVE
Nitrite: NEGATIVE
Protein, ur: NEGATIVE mg/dL
Specific Gravity, Urine: 1.015 (ref 1.005–1.030)
pH: 5 (ref 5.0–8.0)

## 2020-06-25 LAB — COMPREHENSIVE METABOLIC PANEL
ALT: 32 U/L (ref 0–44)
AST: 37 U/L (ref 15–41)
Albumin: 3.6 g/dL (ref 3.5–5.0)
Alkaline Phosphatase: 92 U/L (ref 38–126)
Anion gap: 20 — ABNORMAL HIGH (ref 5–15)
BUN: 120 mg/dL — ABNORMAL HIGH (ref 8–23)
CO2: 20 mmol/L — ABNORMAL LOW (ref 22–32)
Calcium: 9 mg/dL (ref 8.9–10.3)
Chloride: 102 mmol/L (ref 98–111)
Creatinine, Ser: 3.43 mg/dL — ABNORMAL HIGH (ref 0.44–1.00)
GFR calc Af Amer: 13 mL/min — ABNORMAL LOW (ref >60)
GFR calc non Af Amer: 11 mL/min — ABNORMAL LOW (ref >60)
Glucose, Bld: 212 mg/dL — ABNORMAL HIGH (ref 70–99)
Potassium: 4.1 mmol/L (ref 3.5–5.1)
Sodium: 142 mmol/L (ref 135–145)
Total Bilirubin: 1.2 mg/dL (ref 0.3–1.2)
Total Protein: 7.3 g/dL (ref 6.5–8.1)

## 2020-06-25 MED ORDER — ACETAMINOPHEN 650 MG RE SUPP: 650.0000 mg | Freq: Four times a day (QID) | RECTAL | Status: DC | PRN

## 2020-06-25 MED ORDER — ONDANSETRON HCL 4 MG PO TABS: 4.0000 mg | ORAL_TABLET | Freq: Four times a day (QID) | ORAL | Status: DC | PRN

## 2020-06-25 MED ORDER — ACETAMINOPHEN 325 MG PO TABS: 650.0000 mg | ORAL_TABLET | Freq: Four times a day (QID) | ORAL | Status: DC | PRN

## 2020-06-25 MED ORDER — ONDANSETRON HCL 4 MG/2ML IJ SOLN: 4.0000 mg | Freq: Four times a day (QID) | INTRAMUSCULAR | Status: DC | PRN

## 2020-06-25 MED ORDER — ENOXAPARIN SODIUM 30 MG/0.3ML ~~LOC~~ SOLN
30.0000 mg | SUBCUTANEOUS | Status: DC
  Administered 2020-06-25 – 2020-06-26 (×2): 30 mg via SUBCUTANEOUS
  Filled 2020-06-25 (×2): qty 0.3

## 2020-06-25 MED ORDER — SODIUM CHLORIDE 0.45 % IV SOLN: INTRAVENOUS | Status: AC

## 2020-06-25 MED ORDER — SODIUM CHLORIDE 0.9 % IV BOLUS
1000.0000 mL | Freq: Once | INTRAVENOUS | Status: AC
  Administered 2020-06-25: 1000 mL via INTRAVENOUS

## 2020-06-25 NOTE — ED Provider Notes (Signed)
[facility] EMERGENCY DEPARTMENT Provider Note   CSN: [id number] Arrival date & time: 06/25/20  1735     History Chief Complaint  Patient presents with  . abnormal labs    Betty [name] is a 86 y.o. female.  Patient is a nursing home.  She seems to be more confused and weak.  She was sent over here for worsening confusion  The history is provided by the nursing home. No language interpreter was used.  Weakness Severity:  Moderate Onset quality:  Gradual Timing:  Constant Progression:  Worsening Chronicity:  Recurrent Context: not alcohol use   Relieved by:  Nothing Worsened by:  Nothing Ineffective treatments:  None tried      Past Medical History:  Diagnosis Date  . Chronic kidney disease    stage II  . Dementia ([place])   . Diabetes mellitus   . Hyperlipidemia   . Hypertension     Patient Active Problem List   Diagnosis Date Noted  . AKI (acute kidney injury) ([place]) 06/25/2020  . Constipation 04/24/2019  . Postoperative anemia 05/18/2018  . S/P ORIF (open reduction internal fixation) fracture IM nail left femur 03/28/18 04/04/2018  . Closed intertrochanteric fracture of femur ([place]) 03/27/2018  . Hip fracture ([place]) 03/26/2018  . Mild protein malnutrition ([place]) 11/28/2017  . Syncope 01/11/2016  . UTI (urinary tract infection) 01/11/2016  . Diarrhea 12/02/2014  . Chronic insomnia 04/01/2013  . CKD (chronic kidney disease), stage III ([place]) 01/02/2013  . Dementia ([place]) 09/23/2012  . Weight loss 09/23/2012  . Anxiety 06/21/2012  . Type II diabetes mellitus with renal manifestations ([place]) 05/17/2012  . Essential hypertension, benign 05/17/2012  . Hyperlipidemia 05/17/2012    Past Surgical History:  Procedure Laterality Date  . ABDOMINAL HYSTERECTOMY    . BACK SURGERY    . INTRAMEDULLARY (IM) NAIL INTERTROCHANTERIC Left 03/28/2018   Procedure: INTRAMEDULLARY (IM) NAIL INTERTROCHANTRIC;  Surgeon: Carole Civil, MD;  Location: [facility];  Service:  Orthopedics;  Laterality: Left;     OB History   No obstetric history on file.     Family History  Problem Relation Age of Onset  . Hypertension Mother   . Cancer Father        colon   . Cancer Sister        Breast cancer  . Osteoporosis Sister     Social History   Tobacco Use  . Smoking status: Never Smoker  . Smokeless tobacco: Never Used  Vaping Use  . Vaping Use: Never used  Substance Use Topics  . Alcohol use: No  . Drug use: No    Home Medications Prior to Admission medications   Medication Sig Start Date End Date Taking? Authorizing Provider  acetaminophen (TYLENOL) 325 MG tablet Take 2 tablets (650 mg total) by mouth every 6 (six) hours as needed for mild pain (or Fever >/= 101). Patient not taking: Reported on 03/30/2020 03/30/18   Isaac Bliss, Rayford Halsted, MD  ALPRAZolam Duanne Moron) 0.25 MG tablet TAKE 1 TABLET(0.25 MG) BY MOUTH AT BEDTIME AS NEEDED 05/20/20   Alycia Rossetti, MD  BD PEN NEEDLE MICRO U/F 32G X 6 MM MISC INJECT NOVOLOG THREE TIMES DAILY AND LANTUS AT BEDTIME 03/10/20   [place], Modena Nunnery, MD  BD PEN NEEDLE NANO U/F 32G X 4 MM MISC USE AS DIRECTED TO INJECT NOVOLOG 3 TIMES A DAY AND LANTUS AT BEDTIME. 05/03/19   Alycia Rossetti, MD  Blood Glucose Monitoring Suppl (BLOOD GLUCOSE SYSTEM PAK) KIT  Use as instructed for 3x daily testing. Dx: E11.22. 04/25/19   Alycia Rossetti, MD  busPIRone (BUSPAR) 5 MG tablet TAKE 1 TABLET(5 MG) BY MOUTH TWICE DAILY 02/24/20   [place], Modena Nunnery, MD  docusate sodium (COLACE) 100 MG capsule Take 1 capsule (100 mg total) by mouth 2 (two) times daily. Patient not taking: Reported on 03/30/2020 04/24/19   Alycia Rossetti, MD  ferrous sulfate (QC FERROUS SULFATE) 325 (65 FE) MG tablet Take 325 mg by mouth daily with breakfast.    [provider]  glipiZIDE (GLUCOTROL) 5 MG tablet TAKE 1 TABLET(5 MG) BY MOUTH TWICE DAILY BEFORE A MEAL 01/27/20   [place], Modena Nunnery, MD  hydrochlorothiazide (HYDRODIURIL) 12.5 MG tablet TAKE  1 TABLET(12.5 MG) BY MOUTH DAILY 03/31/20   [place], Modena Nunnery, MD  insulin aspart (NOVOLOG FLEXPEN) 100 UNIT/ML FlexPen Inject 6 Units into the skin daily at 12 noon. 03/11/20   Alycia Rossetti, MD  Insulin Detemir (LEVEMIR [name]) Inject into the skin.    [provider]  Lancets (ONETOUCH DELICA PLUS [id number]) MISC USE TO TEST BLOOD SUGAR UP TO FOUR TIMES DAILY 04/13/20   Alycia Rossetti, MD  LANTUS SOLOSTAR 100 UNIT/ML Solostar Pen INJECT 8 UNITS INTO THE SKIN AT BEDTIME Patient not taking: Reported on 03/30/2020 09/18/19   Alycia Rossetti, MD  losartan (COZAAR) 100 MG tablet TAKE 1 TABLET BY MOUTH EVERY DAY 03/16/20   Alycia Rossetti, MD  Memantine HCl-Donepezil HCl [facility]) 28-10 MG CP24 Take 1 capsule by mouth daily. 09/06/19   Alycia Rossetti, MD  metoprolol tartrate (LOPRESSOR) 25 MG tablet TAKE 1 TABLET BY MOUTH TWICE DAILY WITH FOOD 04/14/20   [place], Modena Nunnery, MD  [facility] ULTRA test strip USE TO TEST BLOOD SUGAR UP TO FOUR TIMES DAILY 05/11/20   [place], Modena Nunnery, MD  phenol (CHLORASEPTIC) 1.4 % LIQD Use as directed 1 spray in the mouth or throat as needed for throat irritation / pain. Patient not taking: Reported on 03/30/2020 03/30/18   Isaac Bliss, Rayford Halsted, MD  QUEtiapine (SEROQUEL) 25 MG tablet TAKE 1 TABLET(25 MG) BY MOUTH AT BEDTIME FOR SLEEP OR AGITATION 04/14/20   Alycia Rossetti, MD  rosuvastatin (CRESTOR) 20 MG tablet TAKE 1 TABLET BY MOUTH AT BEDTIME FOR CHOLESTEROL 11/07/19   Alycia Rossetti, MD  traZODone (DESYREL) 50 MG tablet TAKE 1/2 TO 1 TABLET(25 TO 50 MG) BY MOUTH AT BEDTIME AS NEEDED FOR SLEEP Patient not taking: Reported on 03/30/2020 03/16/20   Alycia Rossetti, MD  triamcinolone cream (KENALOG) 0.1 % Apply 1 application topically 2 (two) times daily. 03/09/20   Alycia Rossetti, MD    Allergies    Patient has no known allergies.  Review of Systems   Review of Systems  Unable to perform ROS: Mental status change  Neurological: Positive for  weakness.    Physical Exam Updated Vital Signs BP (!) 129/56 (BP Location: Right Arm)   Pulse 77   Temp 98 F (36.7 C) (Oral)   Resp 18   Ht _0  (1.575 m)   Wt 68 kg   SpO2 100%   BMI 27.44 kg/m   Physical Exam Vitals and nursing note reviewed.  Constitutional:      Appearance: She is well-developed.  HENT:     Head: Normocephalic.     Nose: Nose normal.     Mouth/Throat:     Comments: Dry mucous membranes Eyes:     General: No scleral  icterus.    Conjunctiva/sclera: Conjunctivae normal.  Neck:     Thyroid: No thyromegaly.  Cardiovascular:     Rate and Rhythm: Normal rate and regular rhythm.     Heart sounds: No murmur heard.  No friction rub. No gallop.   Pulmonary:     Breath sounds: No stridor. No wheezing or rales.  Chest:     Chest wall: No tenderness.  Abdominal:     General: There is no distension.     Tenderness: There is no abdominal tenderness. There is no rebound.  Musculoskeletal:        General: Normal range of motion.     Cervical back: Neck supple.  Lymphadenopathy:     Cervical: No cervical adenopathy.  Skin:    Findings: No erythema or rash.  Neurological:     Mental Status: She is alert.     Motor: No abnormal muscle tone.     Coordination: Coordination normal.     Comments: Oriented to person only  Psychiatric:        Behavior: Behavior normal.     ED Results / Procedures / Treatments   Labs (all labs ordered are listed, but only abnormal results are displayed) Labs Reviewed  COMPREHENSIVE METABOLIC PANEL - Abnormal; Notable for the following components:      Result Value   CO2 20 (*)    Glucose, Bld 212 (*)    BUN 120 (*)    Creatinine, Ser 3.43 (*)    GFR calc non Af Amer 11 (*)    GFR calc Af Amer 13 (*)    Anion gap 20 (*)    All other components within normal limits  URINALYSIS, ROUTINE W REFLEX MICROSCOPIC - Abnormal; Notable for the following components:   APPearance HAZY (*)    Ketones, ur 5 (*)    All other  components within normal limits  CBC WITH DIFFERENTIAL/PLATELET  BASIC METABOLIC PANEL  CBC    EKG None  Radiology No results found.  Procedures Procedures (including critical care time)  Medications Ordered in ED Medications  enoxaparin (LOVENOX) injection 30 mg (has no administration in time range)  0.45 % sodium chloride infusion (has no administration in time range)  acetaminophen (TYLENOL) tablet 650 mg (has no administration in time range)    Or  acetaminophen (TYLENOL) suppository 650 mg (has no administration in time range)  ondansetron (ZOFRAN) tablet 4 mg (has no administration in time range)    Or  ondansetron (ZOFRAN) injection 4 mg (has no administration in time range)  sodium chloride 0.9 % bolus 1,000 mL (0 mLs Intravenous Stopped 06/25/20 1955)    ED Course  I have reviewed the triage vital signs and the nursing notes.  Pertinent labs & imaging results that were available during my care of the patient were reviewed by me and considered in my medical decision making (see chart for details).    MDM Rules/Calculators/A&P                         Patient with dementia worsening.  Patient with AKI.  She will be admitted to medicine hydrated       This patient presents to the ED for concern of confusion, this involves an extensive number of treatment options, and is a complaint that carries with it a high risk of complications and morbidity.  The differential diagnosis includes dehydration stroke   Lab Tests:   I Ordered, reviewed, and interpreted  labs, which included CBC and chemistries that showed an AKI  Medicines ordered:   I ordered medication normal saline for dehydration  Imaging Studies ordered:     Additional history obtained:   Additional history obtained from record  Previous records obtained and reviewed.  Consultations Obtained:   I consulted hospice and discussed lab and imaging findings  Reevaluation:  After the  interventions stated above, I reevaluated the patient and found no change  Critical Interventions:  .   Final Clinical Impression(s) / ED Diagnoses Final diagnoses:  AKI (acute kidney injury) [facility])    Rx / [place] Orders ED Discharge Orders    None       Milton Ferguson, MD 06/28/20 [id number]

## 2020-06-25 NOTE — ED Notes (Signed)
Dr Ortiz at bedside 

## 2020-06-25 NOTE — H&P (Addendum)
History and Physical    Betty [name]:[id number] DOB: 12/25/1930 DOA: 06/25/2020  PCP: Alycia Rossetti, MD   Patient coming from: Home.  I have personally briefly reviewed patient's old medical records in [place]  Chief Complaint: AMS.  HPI: Betty [name] is a 86 y.o. female with medical history significant of stage III CKD, dementia, type 2 diabetes, hyperlipidemia, hypertension, grade 1 diastolic dysfunction who is sent from [facility] home due to progressively worse AMS associated with poor oral intake over the past few days.  The patient is unable to provide further information due to her memory difficulties, but denies headache, chest pain, back or abdominal pain at this time.  ED Course: Initial vital signs were temperature 98.0 F, pulse 77, respiration 18, blood pressure 129/56 mmHg and O2 sat 100%.  The patient received a 1000 mL NS bolus in the emergency department.  A urinalysis showed ketonuria 5 mg/dL with hazy appearance, but was otherwise unremarkable.  CBC was normal, but might be due to hemoconcentration.  CMP showed sodium 142, potassium 4.1, chloride 102 and CO2 of 20 mmol/L.  Her anion gap was 20.  Glucose 212, BUN 120 and creatinine 3.43 mg/dL.  Her LFTs were within normal limits, but the patient may show mild albuminemia once hydrated.  Review of Systems: As per HPI otherwise all other systems reviewed and are negative.  Past Medical History:  Diagnosis Date  . Chronic kidney disease    stage II  . Dementia ([place])   . Diabetes mellitus   . Hyperlipidemia   . Hypertension     Past Surgical History:  Procedure Laterality Date  . ABDOMINAL HYSTERECTOMY    . BACK SURGERY    . INTRAMEDULLARY (IM) NAIL INTERTROCHANTERIC Left 03/28/2018   Procedure: INTRAMEDULLARY (IM) NAIL INTERTROCHANTRIC;  Surgeon: Carole Civil, MD;  Location: [facility];  Service: Orthopedics;  Laterality: Left;    Social History  reports that she has  never smoked. She has never used smokeless tobacco. She reports that she does not drink alcohol and does not use drugs.  No Known Allergies  Family History  Problem Relation Age of Onset  . Hypertension Mother   . Cancer Father        colon   . Cancer Sister        Breast cancer  . Osteoporosis Sister    Prior to Admission medications   Medication Sig Start Date End Date Taking? Authorizing Provider  acetaminophen (TYLENOL) 325 MG tablet Take 2 tablets (650 mg total) by mouth every 6 (six) hours as needed for mild pain (or Fever >/= 101). Patient not taking: Reported on 03/30/2020 03/30/18   Isaac Bliss, Rayford Halsted, MD  ALPRAZolam Duanne Moron) 0.25 MG tablet TAKE 1 TABLET(0.25 MG) BY MOUTH AT BEDTIME AS NEEDED 05/20/20   Alycia Rossetti, MD  BD PEN NEEDLE MICRO U/F 32G X 6 MM MISC INJECT NOVOLOG THREE TIMES DAILY AND LANTUS AT BEDTIME 03/10/20   [place], Modena Nunnery, MD  BD PEN NEEDLE NANO U/F 32G X 4 MM MISC USE AS DIRECTED TO INJECT NOVOLOG 3 TIMES A DAY AND LANTUS AT BEDTIME. 05/03/19   [place], Modena Nunnery, MD  Blood Glucose Monitoring Suppl (BLOOD GLUCOSE SYSTEM PAK) KIT Use as instructed for 3x daily testing. Dx: E11.22. 04/25/19   Alycia Rossetti, MD  busPIRone (BUSPAR) 5 MG tablet TAKE 1 TABLET(5 MG) BY MOUTH TWICE DAILY 02/24/20   Alycia Rossetti, MD  docusate sodium (COLACE)  100 MG capsule Take 1 capsule (100 mg total) by mouth 2 (two) times daily. Patient not taking: Reported on 03/30/2020 04/24/19   Alycia Rossetti, MD  ferrous sulfate (QC FERROUS SULFATE) 325 (65 FE) MG tablet Take 325 mg by mouth daily with breakfast.    [provider]  glipiZIDE (GLUCOTROL) 5 MG tablet TAKE 1 TABLET(5 MG) BY MOUTH TWICE DAILY BEFORE A MEAL 01/27/20   [place], Modena Nunnery, MD  hydrochlorothiazide (HYDRODIURIL) 12.5 MG tablet TAKE 1 TABLET(12.5 MG) BY MOUTH DAILY 03/31/20   [place], Modena Nunnery, MD  insulin aspart (NOVOLOG FLEXPEN) 100 UNIT/ML FlexPen Inject 6 Units into the skin daily at 12 noon.  03/11/20   Alycia Rossetti, MD  Insulin Detemir (LEVEMIR [place]) Inject into the skin.    [provider]  Lancets (ONETOUCH DELICA PLUS [id number]) MISC USE TO TEST BLOOD SUGAR UP TO FOUR TIMES DAILY 04/13/20   Alycia Rossetti, MD  LANTUS SOLOSTAR 100 UNIT/ML Solostar Pen INJECT 8 UNITS INTO THE SKIN AT BEDTIME Patient not taking: Reported on 03/30/2020 09/18/19   Alycia Rossetti, MD  losartan (COZAAR) 100 MG tablet TAKE 1 TABLET BY MOUTH EVERY DAY 03/16/20   Alycia Rossetti, MD  Memantine HCl-Donepezil HCl [facility]) 28-10 MG CP24 Take 1 capsule by mouth daily. 09/06/19   Alycia Rossetti, MD  metoprolol tartrate (LOPRESSOR) 25 MG tablet TAKE 1 TABLET BY MOUTH TWICE DAILY WITH FOOD 04/14/20   [place], Modena Nunnery, MD  [facility] ULTRA test strip USE TO TEST BLOOD SUGAR UP TO FOUR TIMES DAILY 05/11/20   [place], Modena Nunnery, MD  phenol (CHLORASEPTIC) 1.4 % LIQD Use as directed 1 spray in the mouth or throat as needed for throat irritation / pain. Patient not taking: Reported on 03/30/2020 03/30/18   Isaac Bliss, Rayford Halsted, MD  QUEtiapine (SEROQUEL) 25 MG tablet TAKE 1 TABLET(25 MG) BY MOUTH AT BEDTIME FOR SLEEP OR AGITATION 04/14/20   Alycia Rossetti, MD  rosuvastatin (CRESTOR) 20 MG tablet TAKE 1 TABLET BY MOUTH AT BEDTIME FOR CHOLESTEROL 11/07/19   Alycia Rossetti, MD  traZODone (DESYREL) 50 MG tablet TAKE 1/2 TO 1 TABLET(25 TO 50 MG) BY MOUTH AT BEDTIME AS NEEDED FOR SLEEP Patient not taking: Reported on 03/30/2020 03/16/20   Alycia Rossetti, MD  triamcinolone cream (KENALOG) 0.1 % Apply 1 application topically 2 (two) times daily. 03/09/20   Alycia Rossetti, MD   Physical Exam: Vitals:   06/25/20 1743  BP: (!) 129/56  Pulse: 77  Resp: 18  Temp: 98 F (36.7 C)  TempSrc: Oral  SpO2: 100%  Weight: 68 kg  Height: 5' 2"  (1.575 m)   Constitutional: Frail, chronically ill-appearing elderly female. Eyes: PERRL, lids and conjunctivae normal. ENMT: Mucous membranes and lips are dry.  Posterior pharynx clear of any exudate or lesions. Neck: Normal, supple, no masses, no thyromegaly Respiratory: Clear to auscultation bilaterally, no wheezing, no crackles. Normal respiratory effort. No accessory muscle use.  Cardiovascular: Regular rate and rhythm, no murmurs / rubs / gallops. No extremity edema. 2+ pedal pulses. No carotid bruits.  Abdomen: Nondistended.  BS positive.  Soft, no tenderness, no masses palpated. No hepatosplenomegaly.  Musculoskeletal: Generalized weakness. no clubbing / cyanosis.  Good ROM, no contractures. Normal muscle tone.  Skin: Decreased skin turgor.  No clinically significant rashes, lesions, ulcers otherwise. Neurologic: CN 2-12 grossly intact. Generalized weakness. Unable to fully evaluate. Psychiatric: Alert and oriented x 2, disoriented to time and place.   Labs  on Admission: I have personally reviewed following labs and imaging studies  CBC: Recent Labs  Lab 06/25/20 1801  WBC 8.9  NEUTROABS 7.2  HGB 12.3  HCT 39.9  MCV 86.2  PLT 161   Basic Metabolic Panel: Recent Labs  Lab 06/25/20 1801  NA 142  K 4.1  CL 102  CO2 20*  GLUCOSE 212*  BUN 120*  CREATININE 3.43*  CALCIUM 9.0   GFR: Estimated Creatinine Clearance: 10.5 mL/min (A) (by C-G formula based on SCr of 3.43 mg/dL (H)).  Liver Function Tests: Recent Labs  Lab 06/25/20 1801  AST 37  ALT 32  ALKPHOS 92  BILITOT 1.2  PROT 7.3  ALBUMIN 3.6   Urine analysis:    Component Value Date/Time   COLORURINE YELLOW 06/25/2020 1747   APPEARANCEUR HAZY (A) 06/25/2020 1747   LABSPEC 1.015 06/25/2020 1747   PHURINE 5.0 06/25/2020 1747   GLUCOSEU NEGATIVE 06/25/2020 1747   HGBUR NEGATIVE 06/25/2020 1747   BILIRUBINUR NEGATIVE 06/25/2020 1747   KETONESUR 5 (A) 06/25/2020 1747   PROTEINUR NEGATIVE 06/25/2020 1747   UROBILINOGEN 0.2 07/04/2014 0906   NITRITE NEGATIVE 06/25/2020 1747   LEUKOCYTESUR NEGATIVE 06/25/2020 1747   Radiological Exams on Admission: No results  found.  EKG: Independently reviewed.   Assessment/Plan Principal Problem:   AKI (acute kidney injury) ([place]) Decreased oral intake. Loop diuretic/ARB/hyperglycemia. Observation/telemetry. Continue gentle IV hydration. Monitor intake and output. Hold furosemide. Hold losartan. Follow renal function electrolytes.  Active Problems:   Type II diabetes mellitus with renal manifestations (HCC) Carbohydrate modified diet. Hold glipizide due to AKI. Continue Lantus 8 units daily. Hold lispro insulin. CBG monitoring before meals and at bedtime.    Essential hypertension, benign Continue metoprolol 25 mg p.o. twice daily. Holding ARB and diuretic. Monitor BP and heart rate.    Grade I diastolic dysfunction No signs of decompensation. Continue metoprolol. Holding losartan and furosemide.    Hyperlipidemia Continue rosuvastatin 20 mg p.o. at bedtime.    Dementia ([place]) Reorient as needed. Supportive care. Continue Namzaric 1 capsule daily. Continue Seroquel 25 mg p.o. bedtime. Hold BuSpar during the day to avoid daytime somnolence.    DVT prophylaxis: Lovenox SQ. Code Status:   Full code. Family Communication: Disposition Plan:   Patient is from:  Home.  Anticipated DC to:  Home.  Anticipated DC date:  06/27/2020.  Anticipated DC barriers: Clinical status. Consults called: Admission status:  Observation/telemetry.  Severity of Illness: High since the patient is confused and dehydrated to the point of developing AKI.  She will need to remain in observation for IV hydration and close monitoring.  Reubin Milan MD [facility]  How to contact the [facility] Attending or Consulting provider [place] or covering provider during after hours [place], for this patient?   1. Check the care team in [facility] and look for a) attending/consulting TRH provider listed and b) the [facility] team listed 2. Log into [email or web address] and use [place]'s universal password to access. If you do not  have the password, please contact the hospital operator. 3. Locate the [facility] provider you are looking for under [facility] and page to a number that you can be directly reached. 4. If you still have difficulty reaching the provider, please page the [facility] (Director on Call) for the Hospitalists listed on amion for assistance.  06/25/2020, 8:31 PM   This document was prepared using Dragon voice recognition software and may contain some unintended transcription errors.

## 2020-06-25 NOTE — ED Triage Notes (Signed)
Pt has a history of dementia. Given paperwork by LPN. Pt has altered mental status, lethargy, and abnormal labs. Per facility, BUN and Creatnine are elevated. From [facility].

## 2020-06-26 ENCOUNTER — Observation Stay (HOSPITAL_COMMUNITY): Payer: Medicare HMO

## 2020-06-26 DIAGNOSIS — G9341 Metabolic encephalopathy: Secondary | ICD-10-CM | POA: Diagnosis present

## 2020-06-26 DIAGNOSIS — R0902 Hypoxemia: Secondary | ICD-10-CM | POA: Diagnosis not present

## 2020-06-26 DIAGNOSIS — R278 Other lack of coordination: Secondary | ICD-10-CM | POA: Diagnosis not present

## 2020-06-26 DIAGNOSIS — R29898 Other symptoms and signs involving the musculoskeletal system: Secondary | ICD-10-CM | POA: Diagnosis not present

## 2020-06-26 DIAGNOSIS — R69 Illness, unspecified: Secondary | ICD-10-CM | POA: Diagnosis not present

## 2020-06-26 DIAGNOSIS — N2889 Other specified disorders of kidney and ureter: Secondary | ICD-10-CM | POA: Diagnosis present

## 2020-06-26 DIAGNOSIS — E785 Hyperlipidemia, unspecified: Secondary | ICD-10-CM | POA: Diagnosis present

## 2020-06-26 DIAGNOSIS — I130 Hypertensive heart and chronic kidney disease with heart failure and stage 1 through stage 4 chronic kidney disease, or unspecified chronic kidney disease: Secondary | ICD-10-CM | POA: Diagnosis present

## 2020-06-26 DIAGNOSIS — N179 Acute kidney failure, unspecified: Principal | ICD-10-CM

## 2020-06-26 DIAGNOSIS — Z743 Need for continuous supervision: Secondary | ICD-10-CM | POA: Diagnosis not present

## 2020-06-26 DIAGNOSIS — I509 Heart failure, unspecified: Secondary | ICD-10-CM | POA: Diagnosis present

## 2020-06-26 DIAGNOSIS — Z90710 Acquired absence of both cervix and uterus: Secondary | ICD-10-CM | POA: Diagnosis not present

## 2020-06-26 DIAGNOSIS — N183 Chronic kidney disease, stage 3 unspecified: Secondary | ICD-10-CM | POA: Diagnosis present

## 2020-06-26 DIAGNOSIS — Z794 Long term (current) use of insulin: Secondary | ICD-10-CM | POA: Diagnosis not present

## 2020-06-26 DIAGNOSIS — Z23 Encounter for immunization: Secondary | ICD-10-CM | POA: Diagnosis not present

## 2020-06-26 DIAGNOSIS — Z8249 Family history of ischemic heart disease and other diseases of the circulatory system: Secondary | ICD-10-CM | POA: Diagnosis not present

## 2020-06-26 DIAGNOSIS — Z803 Family history of malignant neoplasm of breast: Secondary | ICD-10-CM | POA: Diagnosis not present

## 2020-06-26 DIAGNOSIS — R4182 Altered mental status, unspecified: Secondary | ICD-10-CM | POA: Diagnosis not present

## 2020-06-26 DIAGNOSIS — Z96641 Presence of right artificial hip joint: Secondary | ICD-10-CM | POA: Diagnosis not present

## 2020-06-26 DIAGNOSIS — R1311 Dysphagia, oral phase: Secondary | ICD-10-CM | POA: Diagnosis not present

## 2020-06-26 DIAGNOSIS — E860 Dehydration: Secondary | ICD-10-CM | POA: Diagnosis present

## 2020-06-26 DIAGNOSIS — I959 Hypotension, unspecified: Secondary | ICD-10-CM | POA: Diagnosis not present

## 2020-06-26 DIAGNOSIS — M25551 Pain in right hip: Secondary | ICD-10-CM | POA: Diagnosis not present

## 2020-06-26 DIAGNOSIS — M6281 Muscle weakness (generalized): Secondary | ICD-10-CM | POA: Diagnosis not present

## 2020-06-26 DIAGNOSIS — E1122 Type 2 diabetes mellitus with diabetic chronic kidney disease: Secondary | ICD-10-CM | POA: Diagnosis present

## 2020-06-26 DIAGNOSIS — Z79899 Other long term (current) drug therapy: Secondary | ICD-10-CM | POA: Diagnosis not present

## 2020-06-26 DIAGNOSIS — Z8616 Personal history of COVID-19: Secondary | ICD-10-CM | POA: Diagnosis not present

## 2020-06-26 DIAGNOSIS — Z8262 Family history of osteoporosis: Secondary | ICD-10-CM | POA: Diagnosis not present

## 2020-06-26 DIAGNOSIS — R41841 Cognitive communication deficit: Secondary | ICD-10-CM | POA: Diagnosis not present

## 2020-06-26 DIAGNOSIS — F0390 Unspecified dementia without behavioral disturbance: Secondary | ICD-10-CM | POA: Diagnosis present

## 2020-06-26 DIAGNOSIS — E1165 Type 2 diabetes mellitus with hyperglycemia: Secondary | ICD-10-CM | POA: Diagnosis present

## 2020-06-26 LAB — CBC
HCT: 37 % (ref 36.0–46.0)
Hemoglobin: 11.4 g/dL — ABNORMAL LOW (ref 12.0–15.0)
MCH: 26.6 pg (ref 26.0–34.0)
MCHC: 30.8 g/dL (ref 30.0–36.0)
MCV: 86.2 fL (ref 80.0–100.0)
Platelets: 200 10*3/uL (ref 150–400)
RBC: 4.29 MIL/uL (ref 3.87–5.11)
RDW: 13.8 % (ref 11.5–15.5)
WBC: 7.3 10*3/uL (ref 4.0–10.5)
nRBC: 0 % (ref 0.0–0.2)

## 2020-06-26 LAB — GLUCOSE, CAPILLARY
Glucose-Capillary: 114 mg/dL — ABNORMAL HIGH (ref 70–99)
Glucose-Capillary: 84 mg/dL (ref 70–99)
Glucose-Capillary: 80 mg/dL (ref 70–99)
Glucose-Capillary: 179 mg/dL — ABNORMAL HIGH (ref 70–99)
Glucose-Capillary: 147 mg/dL — ABNORMAL HIGH (ref 70–99)

## 2020-06-26 LAB — BASIC METABOLIC PANEL
Anion gap: 13 (ref 5–15)
BUN: 106 mg/dL — ABNORMAL HIGH (ref 8–23)
CO2: 24 mmol/L (ref 22–32)
Calcium: 8.7 mg/dL — ABNORMAL LOW (ref 8.9–10.3)
Chloride: 107 mmol/L (ref 98–111)
Creatinine, Ser: 2.66 mg/dL — ABNORMAL HIGH (ref 0.44–1.00)
GFR calc Af Amer: 18 mL/min — ABNORMAL LOW (ref >60)
GFR calc non Af Amer: 16 mL/min — ABNORMAL LOW (ref >60)
Glucose, Bld: 85 mg/dL (ref 70–99)
Potassium: 3.8 mmol/L (ref 3.5–5.1)
Sodium: 144 mmol/L (ref 135–145)

## 2020-06-26 LAB — HEMOGLOBIN A1C
Hgb A1c MFr Bld: 8.5 % — ABNORMAL HIGH (ref 4.8–5.6)
Mean Plasma Glucose: 197.25 mg/dL

## 2020-06-26 LAB — CREATININE, URINE, RANDOM: Creatinine, Urine: 98.6 mg/dL

## 2020-06-26 LAB — SODIUM, URINE, RANDOM: Sodium, Ur: 64 mmol/L

## 2020-06-26 MED ORDER — INSULIN ASPART 100 UNIT/ML ~~LOC~~ SOLN
0.0000 [IU] | Freq: Three times a day (TID) | SUBCUTANEOUS | Status: DC
  Administered 2020-06-26: 2 [IU] via SUBCUTANEOUS

## 2020-06-26 MED ORDER — INSULIN GLARGINE 100 UNIT/ML ~~LOC~~ SOLN
8.0000 [IU] | Freq: Every day | SUBCUTANEOUS | Status: DC
  Administered 2020-06-26 (×2): 8 [IU] via SUBCUTANEOUS
  Filled 2020-06-26 (×4): qty 0.08

## 2020-06-26 MED ORDER — PHENAZOPYRIDINE HCL 100 MG PO TABS: 100.0000 mg | ORAL_TABLET | Freq: Three times a day (TID) | ORAL | Status: DC

## 2020-06-26 MED ORDER — METOPROLOL TARTRATE 25 MG PO TABS
25.0000 mg | ORAL_TABLET | Freq: Two times a day (BID) | ORAL | Status: DC
  Administered 2020-06-26 – 2020-06-27 (×4): 25 mg via ORAL
  Filled 2020-06-26 (×4): qty 1

## 2020-06-26 MED ORDER — ENSURE ENLIVE PO LIQD
237.0000 mL | Freq: Two times a day (BID) | ORAL | Status: DC
  Administered 2020-06-26 – 2020-06-27 (×2): 237 mL via ORAL

## 2020-06-26 MED ORDER — MEMANTINE HCL ER 28 MG PO CP24
28.0000 mg | ORAL_CAPSULE | Freq: Every day | ORAL | Status: DC
  Administered 2020-06-26: 28 mg via ORAL
  Filled 2020-06-26 (×4): qty 1

## 2020-06-26 MED ORDER — DOCUSATE SODIUM 100 MG PO CAPS
100.0000 mg | ORAL_CAPSULE | Freq: Two times a day (BID) | ORAL | Status: DC
  Administered 2020-06-26 – 2020-06-27 (×3): 100 mg via ORAL
  Filled 2020-06-26 (×3): qty 1

## 2020-06-26 MED ORDER — INSULIN ASPART 100 UNIT/ML ~~LOC~~ SOLN: 0.0000 [IU] | Freq: Every day | SUBCUTANEOUS | Status: DC

## 2020-06-26 MED ORDER — DONEPEZIL HCL 5 MG PO TABS
10.0000 mg | ORAL_TABLET | Freq: Every day | ORAL | Status: DC
  Administered 2020-06-26 (×2): 10 mg via ORAL
  Filled 2020-06-26 (×3): qty 2

## 2020-06-26 MED ORDER — QUETIAPINE FUMARATE 25 MG PO TABS
25.0000 mg | ORAL_TABLET | Freq: Every day | ORAL | Status: DC
  Administered 2020-06-26 (×2): 25 mg via ORAL
  Filled 2020-06-26 (×2): qty 1

## 2020-06-26 MED ORDER — ROSUVASTATIN CALCIUM 20 MG PO TABS
20.0000 mg | ORAL_TABLET | Freq: Every day | ORAL | Status: DC
  Administered 2020-06-26 (×2): 20 mg via ORAL
  Filled 2020-06-26 (×2): qty 1

## 2020-06-26 MED ORDER — FERROUS SULFATE 325 (65 FE) MG PO TABS
325.0000 mg | ORAL_TABLET | Freq: Every day | ORAL | Status: DC
  Administered 2020-06-26 – 2020-06-27 (×2): 325 mg via ORAL
  Filled 2020-06-26 (×2): qty 1

## 2020-06-26 MED ORDER — MEMANTINE HCL-DONEPEZIL HCL ER 28-10 MG PO CP24: 1.0000 | ORAL_CAPSULE | Freq: Every day | ORAL | Status: DC

## 2020-06-26 MED ORDER — LABETALOL HCL 5 MG/ML IV SOLN: 10.0000 mg | INTRAVENOUS | Status: DC | PRN

## 2020-06-26 NOTE — Progress Notes (Signed)
Initial Nutrition Assessment  DOCUMENTATION CODES:   Not applicable  INTERVENTION:  Ensure Enlive po BID, each supplement provides 350 kcal and 20 grams of protein . Patient prefers chocolate.  Needs assistance with feeding   Recommend liberalize diet to Regular   NUTRITION DIAGNOSIS:   Inadequate oral intake related to acute illness as evidenced by per patient/family report (meal intake ~ 50% today lunch and repoted poor inatke prior to admission, AKI).   GOAL:  Patient will meet greater than or equal to 90% of their needs    MONITOR:  Labs, PO intake, Supplement acceptance, Weight trends, Skin    REASON FOR ASSESSMENT:    (Poor po intake prior to admission)     ASSESSMENT: Patient is a 86 yo female with hx of dementia, DM-2, CKD-3, HTN, HLD. Presents from SNF with altered mental status, poor oral intake and AKI.   Patient has been at [facility] for  ~ 5 weeks per daughter.   Patient has experienced decline in appetite and ability to feed herself since being at [facility]. Daughter assisted her with lunch today -~50% of meal consumed. Patient likes ice cream, [place] ice and Ensure (chocolate). Encouraged her to ask for snack between meals from nourishment room for her mom.   Patient weight 68-69 kg the past 11 months. At risk for weight loss and malnutrition given her acute change in appetite and ability to feed herself as well as cognitive impairment.   Medications reviewed and include: Colace, Aricept, Namenda, Ferrous sulfate, Novolog, Lantus, Ferrous sulfate   IV-0.45% NS@ 75 ml/hr  Labs: BMP Latest Ref Rng & Units 06/26/2020 06/25/2020 03/09/2020  Glucose 70 - 99 mg/dL 85 212(H) 96  BUN 8 - 23 mg/dL 106(H) 120(H) 45(H)  Creatinine 0.44 - 1.00 mg/dL 2.66(H) 3.43(H) 1.65(H)  BUN/Creat Ratio 6 - 22 (calc) - - 27(H)  Sodium 135 - 145 mmol/L 144 142 143  Potassium 3.5 - 5.1 mmol/L 3.8 4.1 4.7  Chloride 98 - 111 mmol/L 107 102 109  CO2 22 - 32 mmol/L 24 20(L) 28   Calcium 8.9 - 10.3 mg/dL 8.7(L) 9.0 8.9     Diet Order:   Diet Order            Diet heart healthy/carb modified Room service appropriate? Yes; Fluid consistency: Thin  Diet effective now                 EDUCATION NEEDS:  No education needs have been identified at this time    Skin:  Skin Assessment: Reviewed RN Assessment  Last BM:  unknown  Height:   Ht Readings from Last 1 Encounters:  06/25/20 5\' 2"  (1.575 m)    Weight:   Wt Readings from Last 1 Encounters:  06/25/20 68 kg    Ideal Body Weight:   50 kg  BMI:  Body mass index is 27.44 kg/m.  Estimated Nutritional Needs:   Kcal:  7001-7494  Protein:  80-85 gr  Fluid:  1.6-1.8 liters daily   Colman Cater MS,RD,CSG,LDN Pager: #Amion

## 2020-06-26 NOTE — TOC Progression Note (Signed)
Transition of Care [facility]) - Progression Note    Patient Details  Name: Betty [name] MRN: [id number] Date of Birth: 1931-08-16  Transition of Care [facility]) CM/SW [place], LCSW Phone Number: 06/26/2020, 1:23 PM  Clinical Narrative:    CSW notified of palliative consult for patient. CSW contacted [facility] to notify them of patient's need for palliative. CSW inquired about which company they utilized and if [facility] would make the referral for Palliative. Melissa with [place] agreeable to place referral for out patient palliative with [facility]. TOC to follow.        Expected Discharge Plan and Services                                                 Social Determinants of Health (SDOH) Interventions    Readmission Risk Interventions No flowsheet data found.

## 2020-06-26 NOTE — Progress Notes (Addendum)
PROGRESS NOTE    Betty [name]  IFO:[id number] DOB: [birth year] DOA: 06/25/2020 PCP: Alycia Rossetti, MD   Brief Narrative:  Per HPI: Betty [name] is a 86 y.o. female with medical history significant of stage III CKD, dementia, type 2 diabetes, hyperlipidemia, hypertension, grade 1 diastolic dysfunction who is sent from [facility] home due to progressively worse AMS associated with poor oral intake over the past few days.  The patient is unable to provide further information due to her memory difficulties, but denies headache, chest pain, back or abdominal pain at this time.  9/24: Patient was admitted with acute mental status changes and CT head is negative.  She appears to be slowly improving with improvement in renal function and hydration overall.  AKI is also improving with creatinine down to 2.6 this morning.  She is noted to have left lower pole kidney mass which will need further MRI imaging with contrast once renal function has further improved.  Diet started.  Assessment & Plan:   Principal Problem:   AKI (acute kidney injury) ([place]) Active Problems:   Type II diabetes mellitus with renal manifestations ([place])   Essential hypertension, benign   Hyperlipidemia   Dementia (HCC)   Grade I diastolic dysfunction  AKI-likely prerenal -Home ARB and loop diuretic held -Continue IV fluid hydration and monitor strict I's and O's -Follow renal function which appears to be improving -Renal ultrasound with no obstruction noted, but lower kidney mass which will need further evaluation with MRI potentially outpatient  Type 2 diabetes-controlled -Carb modified diet -Holding home glipizide -Continue Lantus -SSI  Essential hypertension-controlled -Continue metoprolol 25 mg p.o. twice daily -Hold ARB and diuretic for now and monitor -Labetalol as needed  Grade 1 diastolic dysfunction -No current signs of decompensation -Monitor daily weights -Decrease IV  fluid to gentle, time-limited  Dyslipidemia -Continue rosuvastatin  Dementia -Continue home medications   DVT prophylaxis:Lovenox Code Status: Full Family Communication: Discussed with daughter on phone 9/24 Disposition Plan:  Status is: Observation  The patient will require care spanning > 2 midnights and should be moved to inpatient because: IV treatments appropriate due to intensity of illness or inability to take PO and Inpatient level of care appropriate due to severity of illness  Dispo: The patient is from: SNF              Anticipated d/c is to: SNF              Anticipated d/c date is: 2 days              Patient currently is not medically stable to d/c.  Patient requires ongoing IV fluid for management of AKI and further stabilization prior to discharge.  Consultants:   None  Procedures:   See below  Antimicrobials:   None   Subjective: Patient seen and evaluated today and is about to head to radiology for head CT.  She appears minimally confused, but is otherwise alert and arousable.  Objective: Vitals:   06/25/20 2152 06/25/20 2328 06/26/20 0046 06/26/20 0645  BP: (!) 137/58 138/65 (!) 113/92 134/63  Pulse: 66 74 70 67  Resp: 18 18 16 18   Temp:   97.6 F (36.4 C) 97.9 F (36.6 C)  TempSrc:   Oral Oral  SpO2: 96% 100% 100% 99%  Weight:      Height:        Intake/Output Summary (Last 24 hours) at 06/26/2020 0657 Last data filed at 06/26/2020 6767 Gross  per 24 hour  Intake 849.34 ml  Output 13 ml  Net 836.34 ml   Filed Weights   06/25/20 1743  Weight: 68 kg    Examination:  General exam: Appears calm and comfortable, minimal confusion Respiratory system: Clear to auscultation. Respiratory effort normal. Cardiovascular system: S1 & S2 heard, RRR.  Gastrointestinal system: Abdomen is nondistended, soft and nontender.  Central nervous system: Alert and awake, but drowsy Extremities: No edema Skin: No rashes, lesions or ulcers Psychiatry:  Flat affect    Data Reviewed: I have personally reviewed following labs and imaging studies  CBC: Recent Labs  Lab 06/25/20 1801  WBC 8.9  NEUTROABS 7.2  HGB 12.3  HCT 39.9  MCV 86.2  PLT 622   Basic Metabolic Panel: Recent Labs  Lab 06/25/20 1801  NA 142  K 4.1  CL 102  CO2 20*  GLUCOSE 212*  BUN 120*  CREATININE 3.43*  CALCIUM 9.0   GFR: Estimated Creatinine Clearance: 10.5 mL/min (A) (by C-G formula based on SCr of 3.43 mg/dL (H)). Liver Function Tests: Recent Labs  Lab 06/25/20 1801  AST 37  ALT 32  ALKPHOS 92  BILITOT 1.2  PROT 7.3  ALBUMIN 3.6   No results for input(s): LIPASE, AMYLASE in the last 168 hours. No results for input(s): AMMONIA in the last 168 hours. Coagulation Profile: No results for input(s): INR, PROTIME in the last 168 hours. Cardiac Enzymes: No results for input(s): CKTOTAL, CKMB, CKMBINDEX, TROPONINI in the last 168 hours. BNP (last 3 results) No results for input(s): PROBNP in the last 8760 hours. HbA1C: No results for input(s): HGBA1C in the last 72 hours. CBG: Recent Labs  Lab 06/26/20 0113  GLUCAP 114*   Lipid Profile: No results for input(s): CHOL, HDL, LDLCALC, TRIG, CHOLHDL, LDLDIRECT in the last 72 hours. Thyroid Function Tests: No results for input(s): TSH, T4TOTAL, FREET4, T3FREE, THYROIDAB in the last 72 hours. Anemia Panel: No results for input(s): VITAMINB12, FOLATE, FERRITIN, TIBC, IRON, RETICCTPCT in the last 72 hours. Sepsis Labs: No results for input(s): PROCALCITON, LATICACIDVEN in the last 168 hours.  No results found for this or any previous visit (from the past 240 hour(s)).       Radiology Studies: No results found.      Scheduled Meds: . docusate sodium  100 mg Oral BID  . memantine  28 mg Oral QHS   And  . donepezil  10 mg Oral QHS  . enoxaparin (LOVENOX) injection  30 mg Subcutaneous Q24H  . ferrous sulfate  325 mg Oral Q breakfast  . insulin glargine  8 Units Subcutaneous QHS   . metoprolol tartrate  25 mg Oral BID  . QUEtiapine  25 mg Oral QHS  . rosuvastatin  20 mg Oral QHS   Continuous Infusions: . sodium chloride 100 mL/hr at 06/26/20 0649     LOS: 0 days    Time spent: 35 minutes    Mercades Bajaj Darleen Crocker, DO [facility]  If 7PM-7AM, please contact night-coverage [email or web address] 06/26/2020, 6:57 AM

## 2020-06-27 DIAGNOSIS — R41841 Cognitive communication deficit: Secondary | ICD-10-CM | POA: Diagnosis not present

## 2020-06-27 DIAGNOSIS — Z8616 Personal history of COVID-19: Secondary | ICD-10-CM | POA: Diagnosis not present

## 2020-06-27 DIAGNOSIS — R278 Other lack of coordination: Secondary | ICD-10-CM | POA: Diagnosis not present

## 2020-06-27 DIAGNOSIS — M6281 Muscle weakness (generalized): Secondary | ICD-10-CM | POA: Diagnosis not present

## 2020-06-27 DIAGNOSIS — R69 Illness, unspecified: Secondary | ICD-10-CM | POA: Diagnosis not present

## 2020-06-27 DIAGNOSIS — R1311 Dysphagia, oral phase: Secondary | ICD-10-CM | POA: Diagnosis not present

## 2020-06-27 LAB — BASIC METABOLIC PANEL
Anion gap: 14 (ref 5–15)
BUN: 68 mg/dL — ABNORMAL HIGH (ref 8–23)
CO2: 24 mmol/L (ref 22–32)
Calcium: 8.5 mg/dL — ABNORMAL LOW (ref 8.9–10.3)
Chloride: 103 mmol/L (ref 98–111)
Creatinine, Ser: 1.75 mg/dL — ABNORMAL HIGH (ref 0.44–1.00)
GFR calc Af Amer: 30 mL/min — ABNORMAL LOW (ref >60)
GFR calc non Af Amer: 26 mL/min — ABNORMAL LOW (ref >60)
Glucose, Bld: 99 mg/dL (ref 70–99)
Potassium: 3.3 mmol/L — ABNORMAL LOW (ref 3.5–5.1)
Sodium: 141 mmol/L (ref 135–145)

## 2020-06-27 LAB — CBC
HCT: 35.6 % — ABNORMAL LOW (ref 36.0–46.0)
Hemoglobin: 11.1 g/dL — ABNORMAL LOW (ref 12.0–15.0)
MCH: 26.4 pg (ref 26.0–34.0)
MCHC: 31.2 g/dL (ref 30.0–36.0)
MCV: 84.8 fL (ref 80.0–100.0)
Platelets: 189 10*3/uL (ref 150–400)
RBC: 4.2 MIL/uL (ref 3.87–5.11)
RDW: 13.5 % (ref 11.5–15.5)
WBC: 7.8 10*3/uL (ref 4.0–10.5)
nRBC: 0 % (ref 0.0–0.2)

## 2020-06-27 LAB — URINE CULTURE

## 2020-06-27 LAB — GLUCOSE, CAPILLARY
Glucose-Capillary: 101 mg/dL — ABNORMAL HIGH (ref 70–99)
Glucose-Capillary: 143 mg/dL — ABNORMAL HIGH (ref 70–99)

## 2020-06-27 LAB — MAGNESIUM: Magnesium: 2.1 mg/dL (ref 1.7–2.4)

## 2020-06-27 MED ORDER — ENSURE ENLIVE PO LIQD: 237.0000 mL | Freq: Two times a day (BID) | ORAL | 12 refills | Status: DC

## 2020-06-27 MED ORDER — POTASSIUM CHLORIDE CRYS ER 20 MEQ PO TBCR
40.0000 meq | EXTENDED_RELEASE_TABLET | Freq: Once | ORAL | Status: AC
  Administered 2020-06-27: 40 meq via ORAL
  Filled 2020-06-27: qty 2

## 2020-06-27 MED ORDER — BUSPIRONE HCL 5 MG PO TABS
5.0000 mg | ORAL_TABLET | Freq: Two times a day (BID) | ORAL | 0 refills | Status: DC
Start: 2020-06-27 — End: 2020-09-07

## 2020-06-27 NOTE — Discharge Summary (Signed)
Physician Discharge Summary  Betty [name] BTD:[id number] DOB: August 11, 1931 DOA: 06/25/2020  PCP: Alycia Rossetti, MD  Admit date: 06/25/2020  Discharge date: 06/27/2020  Admitted From:SNF  Disposition:  SNF  Recommendations for Outpatient Follow-up:  1. Follow up with PCP in 1-2 weeks 2. Please obtain BMP/CBC in one week 3. Avoid further use of hydrochlorothiazide and losartan and increased dose of metoprolol as needed for better blood pressure control due to unreliable oral intake with risk of AKI and hypotension 4. Consider outpatient palliative care follow-up in the near future to address goals of care 5. Please schedule MRI with contrast to address left lower pole kidney mass  Home Health: None  Equipment/Devices: None  Discharge Condition: Stable  CODE STATUS: Full  Diet recommendation: Heart Healthy/carb modified  Brief/Interim Summary: Per HPI: Betty Vey Harrisonis a 86 y.o.femalewith medical history significant ofstage III CKD, dementia, type 2 diabetes, hyperlipidemia, hypertension, grade 1 diastolic dysfunctionwho is sent from [facility] home due to progressively worse AMS associated with poor oral intake over the past few days. The patient is unable to provide further information due to her memory difficulties, but denies headache, chest pain, back or abdominal pain at this time.  9/24: Patient was admitted with acute mental status changes and CT head is negative.  She appears to be slowly improving with improvement in renal function and hydration overall.  AKI is also improving with creatinine down to 2.6 this morning.  She is noted to have left lower pole kidney mass which will need further MRI imaging with contrast once renal function has further improved.  Diet started.  9/25: Patient appears to have returned to baseline mentation and is eating and drinking properly.  AKI has resolved and her creatinine has come down to 1.75 which is near her  baseline.  She should remain off of her ARB as well as her diuretic as noted below with careful blood pressure monitoring and repeat labs in 1 week.  She will also need MRI for her left lower pole kidney mass for further evaluation and work-up.  She is tolerating her diet and is otherwise stable for discharge with no other acute events noted during the course of this brief hospitalization.  Discharge Diagnoses:  Principal Problem:   AKI (acute kidney injury) ([place]) Active Problems:   Type II diabetes mellitus with renal manifestations ([place])   Essential hypertension, benign   Hyperlipidemia   Dementia (HCC)   Grade I diastolic dysfunction  Principal discharge diagnosis: AKI-prerenal with associated metabolic encephalopathy.  Discharge Instructions  Discharge Instructions    Diet - low sodium heart healthy   Complete by: As directed    Increase activity slowly   Complete by: As directed      Allergies as of 06/27/2020   No Known Allergies     Medication List    STOP taking these medications   hydrochlorothiazide 12.5 MG tablet Commonly known as: HYDRODIURIL   losartan 100 MG tablet Commonly known as: COZAAR     TAKE these medications   BD Pen Needle Nano U/F 32G X 4 MM Misc Generic drug: Insulin Pen Needle USE AS DIRECTED TO INJECT NOVOLOG 3 TIMES A DAY AND LANTUS AT BEDTIME.   BD Pen Needle Micro U/F 32G X 6 MM Misc Generic drug: Insulin Pen Needle INJECT NOVOLOG THREE TIMES DAILY AND LANTUS AT BEDTIME   Blood Glucose System Pak Kit Use as instructed for 3x daily testing. Dx: E11.22.   busPIRone 5 MG tablet  Commonly known as: BUSPAR Take 1 tablet (5 mg total) by mouth 2 (two) times daily. What changed: See the new instructions.   colchicine 0.6 MG tablet Take 0.6 mg by mouth 2 (two) times daily.   docusate sodium 100 MG capsule Commonly known as: Colace Take 1 capsule (100 mg total) by mouth 2 (two) times daily.   feeding supplement (ENSURE ENLIVE) Liqd Take  237 mLs by mouth 2 (two) times daily between meals.   glipiZIDE 5 MG tablet Commonly known as: GLUCOTROL TAKE 1 TABLET(5 MG) BY MOUTH TWICE DAILY BEFORE A MEAL What changed: See the new instructions.   insulin lispro 100 UNIT/ML injection Commonly known as: HUMALOG Inject 6 Units into the skin daily.   Lantus SoloStar 100 UNIT/ML Solostar Pen Generic drug: insulin glargine INJECT 8 UNITS INTO THE SKIN AT BEDTIME What changed: See the new instructions.   metoprolol tartrate 25 MG tablet Commonly known as: LOPRESSOR TAKE 1 TABLET BY MOUTH TWICE DAILY WITH FOOD What changed: when to take this   Namzaric 28-10 MG Cp24 Generic drug: Memantine HCl-Donepezil HCl Take 1 capsule by mouth daily. What changed: when to take this   NovoLOG FlexPen 100 UNIT/ML FlexPen Generic drug: insulin aspart Inject 6 Units into the skin daily at 12 noon.   OneTouch Delica Plus [id number] Misc USE TO TEST BLOOD SUGAR UP TO FOUR TIMES DAILY   OneTouch Ultra test strip Generic drug: glucose blood USE TO TEST BLOOD SUGAR UP TO FOUR TIMES DAILY   QC Ferrous Sulfate 325 (65 FE) MG tablet Generic drug: ferrous sulfate Take 325 mg by mouth daily with breakfast.   QUEtiapine 25 MG tablet Commonly known as: SEROQUEL TAKE 1 TABLET(25 MG) BY MOUTH AT BEDTIME FOR SLEEP OR AGITATION What changed: See the new instructions.   rosuvastatin 20 MG tablet Commonly known as: CRESTOR TAKE 1 TABLET BY MOUTH AT BEDTIME FOR CHOLESTEROL What changed: See the new instructions.   triamcinolone cream 0.1 % Commonly known as: KENALOG Apply 1 application topically 2 (two) times daily.       Follow-up Information    Lane, Modena Nunnery, MD Follow up in 1 week(s).   Specialty: Family Medicine Contact information: [address], [address] [place] [address] [phone]              No Known Allergies  Consultations:  None   Procedures/Studies: CT HEAD WO CONTRAST  Result Date:  06/26/2020 CLINICAL DATA:  86 year old female with worsening of mental status, poor p.o. over the past few days. EXAM: CT HEAD WITHOUT CONTRAST TECHNIQUE: Contiguous axial images were obtained from the base of the skull through the vertex without intravenous contrast. COMPARISON:  Head CT 01/11/2016.  Brain MRI 06/27/2012. FINDINGS: Brain: Mild mostly perisylvian cerebral volume loss since 2017. There is progressed disproportionate atrophy of the anterior temporal lobes. No midline shift, ventriculomegaly, mass effect, evidence of mass lesion, intracranial hemorrhage or evidence of cortically based acute infarction. Gray-white matter differentiation is within normal limits for age throughout the brain. Vascular: Extensive Calcified atherosclerosis at the skull base. No suspicious intracranial vascular hyperdensity. Skull: No acute osseous abnormality identified. Sinuses/Orbits: Small fluid levels in both sphenoid sinuses where bubbly opacity is also noted. Other Visualized paranasal sinuses and mastoids are clear. Other: Visualized orbits and scalp soft tissues are within normal limits. IMPRESSION: 1. No acute intracranial abnormality. 2. Progressed disproportionate atrophy of the anterior temporal lobes since 2017. 3. Mild sphenoid sinus inflammation or retained secretions. Electronically Signed  By: Genevie Ann M.D.   On: 06/26/2020 09:20   US RENAL  Result Date: 06/26/2020 CLINICAL DATA:  Acute kidney injury. Chronic kidney disease. History of hypertension and diabetes. EXAM: RENAL / URINARY TRACT ULTRASOUND COMPLETE COMPARISON:  The CT of the abdomen and pelvis on 08/06/2010 FINDINGS: Right Kidney: Renal measurements: 8.8 x 4.2 x 3.7 centimeters = volume: 71 mL. Renal parenchyma is hyperechoic. No focal renal mass or hydronephrosis. Left Kidney: Renal measurements: 8.8 x 4.5 x 4.2 centimeters = volume: 86 mL. Solid hyperechoic mass is identified in the LOWER pole region, measuring 1.0 x 0.8 x 1.1 centimeters.  This lesion is new since prior study and warrants further evaluation. Renal parenchyma is hyperechoic. No hydronephrosis. Bladder: Appears normal for degree of bladder distention. Other: None. IMPRESSION: 1. Decreased renal size compared with prior study. Increased renal echogenicity, consistent with renal disease. 2. New hyperechoic 1.1 centimeter mass in the LOWER pole region of the LEFT kidney warranting follow-up. Malignancy needs to be excluded. Recommend contrast-enhanced renal protocol MRI or CT for further characterization. MRI is preferred if the patient is a good candidate for MRI. If the patient is unable to have contrast, favor unenhanced MRI. Electronically Signed   By: Nolon Nations M.D.   On: 06/26/2020 09:49      Discharge Exam: Vitals:   06/26/20 2044 06/27/20 0624  BP: 140/76 137/63  Pulse: 72 76  Resp: 18 18  Temp: 98.4 F (36.9 C) 98 F (36.7 C)  SpO2: 99% 98%   Vitals:   06/26/20 0645 06/26/20 1455 06/26/20 2044 06/27/20 0624  BP: 134/63 (!) 143/70 140/76 137/63  Pulse: 67 64 72 76  Resp: 18 18 18 18   Temp: 97.9 F (36.6 C) 98.6 F (37 C) 98.4 F (36.9 C) 98 F (36.7 C)  TempSrc: Oral Oral  Oral  SpO2: 99% 100% 99% 98%  Weight:      Height:        General: Pt is alert, awake, not in acute distress Cardiovascular: RRR, S1/S2 +, no rubs, no gallops Respiratory: CTA bilaterally, no wheezing, no rhonchi Abdominal: Soft, NT, ND, bowel sounds + Extremities: no edema, no cyanosis    The results of significant diagnostics from this hospitalization (including imaging, microbiology, ancillary and laboratory) are listed below for reference.     Microbiology: No results found for this or any previous visit (from the past 240 hour(s)).   Labs: BNP (last 3 results) No results for input(s): BNP in the last 8760 hours. Basic Metabolic Panel: Recent Labs  Lab 06/25/20 1801 06/26/20 0557 06/27/20 0642  NA 142 144 141  K 4.1 3.8 3.3*  CL 102 107 103  CO2  20* 24 24  GLUCOSE 212* 85 99  BUN 120* 106* 68*  CREATININE 3.43* 2.66* 1.75*  CALCIUM 9.0 8.7* 8.5*  MG  --   --  2.1   Liver Function Tests: Recent Labs  Lab 06/25/20 1801  AST 37  ALT 32  ALKPHOS 92  BILITOT 1.2  PROT 7.3  ALBUMIN 3.6   No results for input(s): LIPASE, AMYLASE in the last 168 hours. No results for input(s): AMMONIA in the last 168 hours. CBC: Recent Labs  Lab 06/25/20 1801 06/26/20 0557 06/27/20 0642  WBC 8.9 7.3 7.8  NEUTROABS 7.2  --   --   HGB 12.3 11.4* 11.1*  HCT 39.9 37.0 35.6*  MCV 86.2 86.2 84.8  PLT 226 200 189   Cardiac Enzymes: No results for input(s): CKTOTAL, CKMB,  CKMBINDEX, TROPONINI in the last 168 hours. BNP: Invalid input(s): POCBNP CBG: Recent Labs  Lab 06/26/20 0721 06/26/20 1132 06/26/20 1559 06/26/20 2039 06/27/20 0732  GLUCAP 84 80 179* 147* 101*   D-Dimer No results for input(s): DDIMER in the last 72 hours. Hgb A1c Recent Labs    06/26/20 0557  HGBA1C 8.5*   Lipid Profile No results for input(s): CHOL, HDL, LDLCALC, TRIG, CHOLHDL, LDLDIRECT in the last 72 hours. Thyroid function studies No results for input(s): TSH, T4TOTAL, T3FREE, THYROIDAB in the last 72 hours.  Invalid input(s): FREET3 Anemia work up No results for input(s): VITAMINB12, FOLATE, FERRITIN, TIBC, IRON, RETICCTPCT in the last 72 hours. Urinalysis    Component Value Date/Time   COLORURINE YELLOW 06/25/2020 1747   APPEARANCEUR HAZY (A) 06/25/2020 1747   LABSPEC 1.015 06/25/2020 1747   PHURINE 5.0 06/25/2020 1747   GLUCOSEU NEGATIVE 06/25/2020 1747   HGBUR NEGATIVE 06/25/2020 1747   BILIRUBINUR NEGATIVE 06/25/2020 1747   KETONESUR 5 (A) 06/25/2020 1747   PROTEINUR NEGATIVE 06/25/2020 1747   UROBILINOGEN 0.2 07/04/2014 0906   NITRITE NEGATIVE 06/25/2020 1747   LEUKOCYTESUR NEGATIVE 06/25/2020 1747   Sepsis Labs Invalid input(s): PROCALCITONIN,  WBC,  LACTICIDVEN Microbiology No results found for this or any previous visit (from  the past 240 hour(s)).   Time coordinating discharge: 35 minutes  SIGNED:   Rodena Goldmann, DO [facility] 06/27/2020, 10:07 AM  If 7PM-7AM, please contact night-coverage [email or web address]

## 2020-06-27 NOTE — NC FL2 (Signed)
[place] LEVEL OF CARE SCREENING TOOL     IDENTIFICATION  Patient Name: Betty [name] Birthdate: Feb 04, 1931 Sex: female Admission Date (Current Location): 06/25/2020  [facility] and [place] Number:  [employer] and Address:  [facility] [address] [place]      Provider Number: [id number]  Attending Physician Name and Address:  Rodena Goldmann, DO  Relative Name and Phone Number:  Dolores Frame [phone]    Current Level of Care: Hospital Recommended Level of Care: [place] Prior Approval Number: [id number]  Date Approved/Denied: 06/12/20 PASRR Number:    Discharge Plan: Other (Comment) (ALF)    Current Diagnoses: Patient Active Problem List   Diagnosis Date Noted  . AKI (acute kidney injury) ([place]) 06/25/2020  . Grade I diastolic dysfunction 57/84/6962  . Constipation 04/24/2019  . Postoperative anemia 05/18/2018  . S/P ORIF (open reduction internal fixation) fracture IM nail left femur 03/28/18 04/04/2018  . Closed intertrochanteric fracture of femur ([place]) 03/27/2018  . Hip fracture ([place]) 03/26/2018  . Mild protein malnutrition ([place]) 11/28/2017  . Syncope 01/11/2016  . UTI (urinary tract infection) 01/11/2016  . Diarrhea 12/02/2014  . Chronic insomnia 04/01/2013  . CKD (chronic kidney disease), stage III ([place]) 01/02/2013  . Dementia ([place]) 09/23/2012  . Weight loss 09/23/2012  . Anxiety 06/21/2012  . Type II diabetes mellitus with renal manifestations ([place]) 05/17/2012  . Essential hypertension, benign 05/17/2012  . Hyperlipidemia 05/17/2012    Orientation RESPIRATION BLADDER Height & Weight     Self, Time, Situation, Place  Normal Incontinent Weight: 150 lb (68 kg) Height:  5' 2"  (157.5 cm)  BEHAVIORAL SYMPTOMS/MOOD NEUROLOGICAL BOWEL NUTRITION STATUS      Continent Diet (Diet heart healthy/carb modified Room service appropriate? Yes; Fluid consistency: Thin)  AMBULATORY STATUS  COMMUNICATION OF NEEDS Skin   Limited Assist Verbally Normal                       Personal Care Assistance Level of Assistance  Bathing, Feeding, Dressing Bathing Assistance: Limited assistance Feeding assistance: Limited assistance Dressing Assistance: Limited assistance     Functional Limitations Info  Sight, Speech, Hearing Sight Info: Adequate Hearing Info: Adequate Speech Info: Adequate    SPECIAL CARE FACTORS FREQUENCY                       Contractures Contractures Info: Not present    Additional Factors Info  Code Status, Insulin Sliding Scale, Allergies Code Status Info: Full Allergies Info: N/A   Insulin Sliding Scale Info: Correction coverage: HS scale: CBG < 70: Implement Hypoglycemia Standing Orders and refer to Hypoglycemia Standing Orders sidebar report, CBG 70 - 120:, CBG 121 - 150: 0 units, CBG 151 - 200: 0 units, CBG 201 - 250: 2 units, CBG 251 - 300: 3 units, CBG 301 - 350: 4 units, CBG 351 - 400: 5 units, CBG > 400: call MD and obtain STAT lab verification       Current Medications (06/27/2020):  This is the current hospital active medication list Current Facility-Administered Medications  Medication Dose Route Frequency Provider Last Rate Last Admin  . acetaminophen (TYLENOL) tablet 650 mg  650 mg Oral Q6H PRN Reubin Milan, MD       Or  . acetaminophen (TYLENOL) suppository 650 mg  650 mg Rectal Q6H PRN Reubin Milan, MD      . docusate sodium (COLACE) capsule 100  mg  100 mg Oral BID Reubin Milan, MD   100 mg at 06/27/20 4944  . memantine (NAMENDA XR) 24 hr capsule 28 mg  28 mg Oral QHS Reubin Milan, MD   28 mg at 06/26/20 0235   And  . donepezil (ARICEPT) tablet 10 mg  10 mg Oral QHS Reubin Milan, MD   10 mg at 06/26/20 2304  . enoxaparin (LOVENOX) injection 30 mg  30 mg Subcutaneous Q24H Reubin Milan, MD   30 mg at 06/26/20 2307  . feeding supplement (ENSURE ENLIVE) (ENSURE ENLIVE) liquid 237 mL   237 mL Oral BID BM Shah, Pratik D, DO   237 mL at 06/27/20 0927  . ferrous sulfate tablet 325 mg  325 mg Oral Q breakfast Reubin Milan, MD   325 mg at 06/27/20 9675  . insulin aspart (novoLOG) injection 0-5 Units  0-5 Units Subcutaneous QHS Shah, Pratik D, DO      . insulin aspart (novoLOG) injection 0-9 Units  0-9 Units Subcutaneous TID WC Shah, Pratik D, DO   2 Units at 06/26/20 1733  . insulin glargine (LANTUS) injection 8 Units  8 Units Subcutaneous QHS Reubin Milan, MD   8 Units at 06/26/20 2303  . labetalol (NORMODYNE) injection 10 mg  10 mg Intravenous Q2H PRN Manuella Ghazi, Pratik D, DO      . metoprolol tartrate (LOPRESSOR) tablet 25 mg  25 mg Oral BID Reubin Milan, MD   25 mg at 06/27/20 0925  . ondansetron (ZOFRAN) tablet 4 mg  4 mg Oral Q6H PRN Reubin Milan, MD       Or  . ondansetron [facility]) injection 4 mg  4 mg Intravenous Q6H PRN Reubin Milan, MD      . QUEtiapine (SEROQUEL) tablet 25 mg  25 mg Oral QHS Reubin Milan, MD   25 mg at 06/26/20 2305  . rosuvastatin (CRESTOR) tablet 20 mg  20 mg Oral QHS Reubin Milan, MD   20 mg at 06/26/20 2307     Discharge Medications: STOP taking these medications   hydrochlorothiazide 12.5 MG tablet Commonly known as: HYDRODIURIL   losartan 100 MG tablet Commonly known as: COZAAR     TAKE these medications   BD Pen Needle Nano U/F 32G X 4 MM Misc Generic drug: Insulin Pen Needle USE AS DIRECTED TO INJECT NOVOLOG 3 TIMES A DAY AND LANTUS AT BEDTIME.   BD Pen Needle Micro U/F 32G X 6 MM Misc Generic drug: Insulin Pen Needle INJECT NOVOLOG THREE TIMES DAILY AND LANTUS AT BEDTIME   Blood Glucose System Pak Kit Use as instructed for 3x daily testing. Dx: E11.22.   busPIRone 5 MG tablet Commonly known as: BUSPAR Take 1 tablet (5 mg total) by mouth 2 (two) times daily. What changed: See the new instructions.   colchicine 0.6 MG tablet Take 0.6 mg by mouth 2 (two) times daily.    docusate sodium 100 MG capsule Commonly known as: Colace Take 1 capsule (100 mg total) by mouth 2 (two) times daily.   feeding supplement (ENSURE ENLIVE) Liqd Take 237 mLs by mouth 2 (two) times daily between meals.   glipiZIDE 5 MG tablet Commonly known as: GLUCOTROL TAKE 1 TABLET(5 MG) BY MOUTH TWICE DAILY BEFORE A MEAL What changed: See the new instructions.   insulin lispro 100 UNIT/ML injection Commonly known as: HUMALOG Inject 6 Units into the skin daily.   Lantus SoloStar 100 UNIT/ML Solostar Pen Generic  drug: insulin glargine INJECT 8 UNITS INTO THE SKIN AT BEDTIME What changed: See the new instructions.   metoprolol tartrate 25 MG tablet Commonly known as: LOPRESSOR TAKE 1 TABLET BY MOUTH TWICE DAILY WITH FOOD What changed: when to take this   Namzaric 28-10 MG Cp24 Generic drug: Memantine HCl-Donepezil HCl Take 1 capsule by mouth daily. What changed: when to take this   NovoLOG FlexPen 100 UNIT/ML FlexPen Generic drug: insulin aspart Inject 6 Units into the skin daily at 12 noon.   OneTouch Delica Plus [id number] Misc USE TO TEST BLOOD SUGAR UP TO FOUR TIMES DAILY   OneTouch Ultra test strip Generic drug: glucose blood USE TO TEST BLOOD SUGAR UP TO FOUR TIMES DAILY   QC Ferrous Sulfate 325 (65 FE) MG tablet Generic drug: ferrous sulfate Take 325 mg by mouth daily with breakfast.   QUEtiapine 25 MG tablet Commonly known as: SEROQUEL TAKE 1 TABLET(25 MG) BY MOUTH AT BEDTIME FOR SLEEP OR AGITATION What changed: See the new instructions.   rosuvastatin 20 MG tablet Commonly known as: CRESTOR TAKE 1 TABLET BY MOUTH AT BEDTIME FOR CHOLESTEROL What changed: See the new instructions.   triamcinolone cream 0.1 % Commonly known as: KENALOG Apply 1 application topically 2 (two) times daily.        Relevant Imaging Results:  Relevant Lab Results:   Additional Information Pt SSN: [id number]  Natasha Bence, LCSW

## 2020-06-27 NOTE — TOC Transition Note (Signed)
Transition of Care [facility]) - CM/SW Discharge Note   Patient Details  Name: Betty [name] MRN: [id number] Date of Birth: Jun 22, 1931  [facility] CM/SW Contact:  Natasha Bence, LCSW Phone Number: 06/27/2020, 11:55 AM   Clinical Narrative:    CSW contacted Melissa, the admissions coordinator with [facility] to verify that they were able to receive patient upon discharge. Melissa agreeable to take patient. CSW faxed FL2 and D/C summary. CSW completed Med necessity form and called EMS. Nurse to call report. TOC signing off.   Final next level of care: Assisted Living Barriers to Discharge: Barriers Resolved   Patient Goals and CMS Choice Patient states their goals for this hospitalization and ongoing recovery are:: Return to ALF   Choice offered to / list presented to : Patient  Discharge Placement   Existing PASRR number confirmed : 06/27/20          Patient [facility] bed at: [facility] Patient to be transferred to facility by: [facility] Name of family member notified: [facility] Patient and family notified of of transfer: 06/27/20  Discharge Plan and Services                                     Social Determinants of Health (SDOH) Interventions     Readmission Risk Interventions No flowsheet data found.

## 2020-06-27 NOTE — Progress Notes (Signed)
Nsg Discharge Note  Admit Date:  06/25/2020 Discharge date: 06/27/2020   Migdalia Dk to be D/C'd Home per MD order.  AVS completed.  Copy for chart, and copy for patient signed, and dated. Patient/caregiver able to verbalize understanding.  Discharge Medication: Allergies as of 06/27/2020   No Known Allergies     Medication List    STOP taking these medications   hydrochlorothiazide 12.5 MG tablet Commonly known as: HYDRODIURIL   losartan 100 MG tablet Commonly known as: COZAAR     TAKE these medications   BD Pen Needle Nano U/F 32G X 4 MM Misc Generic drug: Insulin Pen Needle USE AS DIRECTED TO INJECT NOVOLOG 3 TIMES A DAY AND LANTUS AT BEDTIME.   BD Pen Needle Micro U/F 32G X 6 MM Misc Generic drug: Insulin Pen Needle INJECT NOVOLOG THREE TIMES DAILY AND LANTUS AT BEDTIME   Blood Glucose System Pak Kit Use as instructed for 3x daily testing. Dx: E11.22.   busPIRone 5 MG tablet Commonly known as: BUSPAR Take 1 tablet (5 mg total) by mouth 2 (two) times daily. What changed: See the new instructions.   colchicine 0.6 MG tablet Take 0.6 mg by mouth 2 (two) times daily.   docusate sodium 100 MG capsule Commonly known as: Colace Take 1 capsule (100 mg total) by mouth 2 (two) times daily.   feeding supplement (ENSURE ENLIVE) Liqd Take 237 mLs by mouth 2 (two) times daily between meals.   glipiZIDE 5 MG tablet Commonly known as: GLUCOTROL TAKE 1 TABLET(5 MG) BY MOUTH TWICE DAILY BEFORE A MEAL What changed: See the new instructions.   insulin lispro 100 UNIT/ML injection Commonly known as: HUMALOG Inject 6 Units into the skin daily.   Lantus SoloStar 100 UNIT/ML Solostar Pen Generic drug: insulin glargine INJECT 8 UNITS INTO THE SKIN AT BEDTIME What changed: See the new instructions.   metoprolol tartrate 25 MG tablet Commonly known as: LOPRESSOR TAKE 1 TABLET BY MOUTH TWICE DAILY WITH FOOD What changed: when to take this   Namzaric 28-10 MG  Cp24 Generic drug: Memantine HCl-Donepezil HCl Take 1 capsule by mouth daily. What changed: when to take this   NovoLOG FlexPen 100 UNIT/ML FlexPen Generic drug: insulin aspart Inject 6 Units into the skin daily at 12 noon.   OneTouch Delica Plus [id number] Misc USE TO TEST BLOOD SUGAR UP TO FOUR TIMES DAILY   OneTouch Ultra test strip Generic drug: glucose blood USE TO TEST BLOOD SUGAR UP TO FOUR TIMES DAILY   QC Ferrous Sulfate 325 (65 FE) MG tablet Generic drug: ferrous sulfate Take 325 mg by mouth daily with breakfast.   QUEtiapine 25 MG tablet Commonly known as: SEROQUEL TAKE 1 TABLET(25 MG) BY MOUTH AT BEDTIME FOR SLEEP OR AGITATION What changed: See the new instructions.   rosuvastatin 20 MG tablet Commonly known as: CRESTOR TAKE 1 TABLET BY MOUTH AT BEDTIME FOR CHOLESTEROL What changed: See the new instructions.   triamcinolone cream 0.1 % Commonly known as: KENALOG Apply 1 application topically 2 (two) times daily.       Discharge Assessment: Vitals:   06/26/20 2044 06/27/20 0624  BP: 140/76 137/63  Pulse: 72 76  Resp: 18 18  Temp: 98.4 F (36.9 C) 98 F (36.7 C)  SpO2: 99% 98%   Skin clean, dry and intact without evidence of skin break down, no evidence of skin tears noted. IV catheter discontinued intact. Site without signs and symptoms of complications - no redness or edema noted  at insertion site, patient denies c/o pain - only slight tenderness at site.  Dressing with slight pressure applied.  D/c Instructions-Education: Discharge instructions given to patient/family with verbalized understanding. D/c education completed with patient/family including follow up instructions, medication list, d/c activities limitations if indicated, with other d/c instructions as indicated by MD - patient able to verbalize understanding, all questions fully answered. Patient instructed to return to ED, call 911, or call MD for any changes in condition.  Patient  escorted via [place], and D/C home via private auto.  Dorcas Mcmurray, LPN 8/71/9597 4:71 PM

## 2020-06-27 NOTE — NC FL2 (Deleted)
[place] LEVEL OF CARE SCREENING TOOL     IDENTIFICATION  Patient Name: Betty [name] Birthdate: Feb 27, 1931 Sex: female Admission Date (Current Location): 06/25/2020  [facility] and [place] Number:  [employer] and Address:  [place] [address], [place]      Provider Number: [id number]  Attending Physician Name and Address:  Rodena Goldmann, DO  Relative Name and Phone Number:  Dolores Frame [phone]    Current Level of Care: Hospital Recommended Level of Care: [place] Prior Approval Number:    Date Approved/Denied:   PASRR Number:    Discharge Plan: Other (Comment) (ALF)    Current Diagnoses: Patient Active Problem List   Diagnosis Date Noted   AKI (acute kidney injury) ([place]) 06/25/2020   Grade I diastolic dysfunction 99/77/4142   Constipation 04/24/2019   Postoperative anemia 05/18/2018   S/P ORIF (open reduction internal fixation) fracture IM nail left femur 03/28/18 04/04/2018   Closed intertrochanteric fracture of femur ([place]) 03/27/2018   Hip fracture ([place]) 03/26/2018   Mild protein malnutrition ([place]) 11/28/2017   Syncope 01/11/2016   UTI (urinary tract infection) 01/11/2016   Diarrhea 12/02/2014   Chronic insomnia 04/01/2013   CKD (chronic kidney disease), stage III ([place]) 01/02/2013   Dementia ([place]) 09/23/2012   Weight loss 09/23/2012   Anxiety 06/21/2012   Type II diabetes mellitus with renal manifestations ([place]) 05/17/2012   Essential hypertension, benign 05/17/2012   Hyperlipidemia 05/17/2012    Orientation RESPIRATION BLADDER Height & Weight     Self, Time, Situation, Place  Normal Incontinent Weight: 150 lb (68 kg) Height:  5\' 2"  (157.5 cm)  BEHAVIORAL SYMPTOMS/MOOD NEUROLOGICAL BOWEL NUTRITION STATUS      Continent Diet (Diet heart healthy/carb modified Room service appropriate? Yes; Fluid consistency: Thin)  AMBULATORY STATUS COMMUNICATION OF  NEEDS Skin   Limited Assist Verbally Normal                       Personal Care Assistance Level of Assistance  Bathing, Feeding, Dressing Bathing Assistance: Limited assistance Feeding assistance: Limited assistance Dressing Assistance: Limited assistance     Functional Limitations Info  Sight, Speech, Hearing Sight Info: Adequate Hearing Info: Adequate Speech Info: Adequate    SPECIAL CARE FACTORS FREQUENCY                       Contractures Contractures Info: Not present    Additional Factors Info  Code Status, Insulin Sliding Scale, Allergies Code Status Info: Full Allergies Info: N/A   Insulin Sliding Scale Info: Correction coverage: HS scale: CBG < 70: Implement Hypoglycemia Standing Orders and refer to Hypoglycemia Standing Orders sidebar report, CBG 70 - 120:, CBG 121 - 150: 0 units, CBG 151 - 200: 0 units, CBG 201 - 250: 2 units, CBG 251 - 300: 3 units, CBG 301 - 350: 4 units, CBG 351 - 400: 5 units, CBG > 400: call MD and obtain STAT lab verification       Current Medications (06/27/2020):  This is the current hospital active medication list Current Facility-Administered Medications  Medication Dose Route Frequency Provider Last Rate Last Admin   acetaminophen (TYLENOL) tablet 650 mg  650 mg Oral Q6H PRN Reubin Milan, MD       Or   acetaminophen (TYLENOL) suppository 650 mg  650 mg Rectal Q6H PRN Reubin Milan, MD       docusate sodium (COLACE)  capsule 100 mg  100 mg Oral BID Reubin Milan, MD   100 mg at 06/27/20 2263   memantine (NAMENDA XR) 24 hr capsule 28 mg  28 mg Oral QHS Reubin Milan, MD   28 mg at 06/26/20 0235   And   donepezil (ARICEPT) tablet 10 mg  10 mg Oral QHS Reubin Milan, MD   10 mg at 06/26/20 2304   enoxaparin (LOVENOX) injection 30 mg  30 mg Subcutaneous Q24H Reubin Milan, MD   30 mg at 06/26/20 2307   feeding supplement (ENSURE ENLIVE) (ENSURE ENLIVE) liquid 237 mL  237 mL Oral BID BM  Heath Lark D, DO   237 mL at 06/27/20 3354   ferrous sulfate tablet 325 mg  325 mg Oral Q breakfast Reubin Milan, MD   325 mg at 06/27/20 5625   insulin aspart (novoLOG) injection 0-5 Units  0-5 Units Subcutaneous QHS Manuella Ghazi, Pratik D, DO       insulin aspart (novoLOG) injection 0-9 Units  0-9 Units Subcutaneous TID WC Shah, Pratik D, DO   2 Units at 06/26/20 1733   insulin glargine (LANTUS) injection 8 Units  8 Units Subcutaneous QHS Reubin Milan, MD   8 Units at 06/26/20 2303   labetalol (NORMODYNE) injection 10 mg  10 mg Intravenous Q2H PRN Manuella Ghazi, Pratik D, DO       metoprolol tartrate (LOPRESSOR) tablet 25 mg  25 mg Oral BID Reubin Milan, MD   25 mg at 06/27/20 0925   ondansetron (ZOFRAN) tablet 4 mg  4 mg Oral Q6H PRN Reubin Milan, MD       Or   ondansetron [facility]) injection 4 mg  4 mg Intravenous Q6H PRN Reubin Milan, MD       QUEtiapine (SEROQUEL) tablet 25 mg  25 mg Oral QHS Reubin Milan, MD   25 mg at 06/26/20 2305   rosuvastatin (CRESTOR) tablet 20 mg  20 mg Oral QHS Reubin Milan, MD   20 mg at 06/26/20 2307     Discharge Medications: Please see discharge summary for a list of discharge medications.  Relevant Imaging Results:  Relevant Lab Results:   Additional Information Pt SSN: [id number]  Natasha Bence, LCSW

## 2020-07-01 DIAGNOSIS — R69 Illness, unspecified: Secondary | ICD-10-CM | POA: Diagnosis not present

## 2020-07-01 DIAGNOSIS — E860 Dehydration: Secondary | ICD-10-CM | POA: Diagnosis not present

## 2020-07-02 DIAGNOSIS — D649 Anemia, unspecified: Secondary | ICD-10-CM | POA: Diagnosis not present

## 2020-07-02 DIAGNOSIS — Z79899 Other long term (current) drug therapy: Secondary | ICD-10-CM | POA: Diagnosis not present

## 2020-07-03 DIAGNOSIS — Z8616 Personal history of COVID-19: Secondary | ICD-10-CM | POA: Diagnosis not present

## 2020-07-03 DIAGNOSIS — R41841 Cognitive communication deficit: Secondary | ICD-10-CM | POA: Diagnosis not present

## 2020-07-03 DIAGNOSIS — M6281 Muscle weakness (generalized): Secondary | ICD-10-CM | POA: Diagnosis not present

## 2020-07-03 DIAGNOSIS — Z23 Encounter for immunization: Secondary | ICD-10-CM | POA: Diagnosis not present

## 2020-07-03 DIAGNOSIS — R69 Illness, unspecified: Secondary | ICD-10-CM | POA: Diagnosis not present

## 2020-07-03 DIAGNOSIS — R278 Other lack of coordination: Secondary | ICD-10-CM | POA: Diagnosis not present

## 2020-07-03 DIAGNOSIS — R1311 Dysphagia, oral phase: Secondary | ICD-10-CM | POA: Diagnosis not present

## 2020-07-08 DIAGNOSIS — Z79899 Other long term (current) drug therapy: Secondary | ICD-10-CM | POA: Diagnosis not present

## 2020-07-08 DIAGNOSIS — E119 Type 2 diabetes mellitus without complications: Secondary | ICD-10-CM | POA: Diagnosis not present

## 2020-07-08 DIAGNOSIS — R630 Anorexia: Secondary | ICD-10-CM | POA: Diagnosis not present

## 2020-07-10 ENCOUNTER — Other Ambulatory Visit: Payer: Self-pay | Admitting: Family Medicine

## 2020-07-10 DIAGNOSIS — R69 Illness, unspecified: Secondary | ICD-10-CM | POA: Diagnosis not present

## 2020-07-14 DIAGNOSIS — D688 Other specified coagulation defects: Secondary | ICD-10-CM | POA: Diagnosis not present

## 2020-07-29 DIAGNOSIS — R69 Illness, unspecified: Secondary | ICD-10-CM | POA: Diagnosis not present

## 2020-07-29 DIAGNOSIS — E119 Type 2 diabetes mellitus without complications: Secondary | ICD-10-CM | POA: Diagnosis not present

## 2020-07-29 DIAGNOSIS — I10 Essential (primary) hypertension: Secondary | ICD-10-CM | POA: Diagnosis not present

## 2020-07-30 DIAGNOSIS — Z713 Dietary counseling and surveillance: Secondary | ICD-10-CM | POA: Diagnosis not present

## 2020-07-30 DIAGNOSIS — E1122 Type 2 diabetes mellitus with diabetic chronic kidney disease: Secondary | ICD-10-CM | POA: Diagnosis not present

## 2020-07-30 DIAGNOSIS — R69 Illness, unspecified: Secondary | ICD-10-CM | POA: Diagnosis not present

## 2020-08-04 DIAGNOSIS — F411 Generalized anxiety disorder: Secondary | ICD-10-CM | POA: Diagnosis not present

## 2020-08-04 DIAGNOSIS — R69 Illness, unspecified: Secondary | ICD-10-CM | POA: Diagnosis not present

## 2020-08-04 DIAGNOSIS — D649 Anemia, unspecified: Secondary | ICD-10-CM | POA: Diagnosis not present

## 2020-08-04 DIAGNOSIS — M1990 Unspecified osteoarthritis, unspecified site: Secondary | ICD-10-CM | POA: Diagnosis not present

## 2020-08-04 DIAGNOSIS — E119 Type 2 diabetes mellitus without complications: Secondary | ICD-10-CM | POA: Diagnosis not present

## 2020-08-04 DIAGNOSIS — E785 Hyperlipidemia, unspecified: Secondary | ICD-10-CM | POA: Diagnosis not present

## 2020-08-10 ENCOUNTER — Telehealth: Payer: Self-pay | Admitting: Family Medicine

## 2020-08-10 NOTE — Telephone Encounter (Signed)
[employer] forms to be filled out. Forms place in [employer]

## 2020-08-11 NOTE — Telephone Encounter (Signed)
Noted  

## 2020-08-11 NOTE — Telephone Encounter (Signed)
Unable to complete forms. Patient has been admitted to SNF and established care with new MD. [facility] will ned to re-establish care prior to form completion.   Of note, MD at [facility] can complete forms for patient and son.   Call placed to patient son Betty [name] to make aware. LM on VM.

## 2020-08-14 DIAGNOSIS — R519 Headache, unspecified: Secondary | ICD-10-CM | POA: Diagnosis not present

## 2020-08-14 DIAGNOSIS — I10 Essential (primary) hypertension: Secondary | ICD-10-CM | POA: Diagnosis not present

## 2020-08-14 DIAGNOSIS — E1122 Type 2 diabetes mellitus with diabetic chronic kidney disease: Secondary | ICD-10-CM | POA: Diagnosis not present

## 2020-08-14 DIAGNOSIS — Z20828 Contact with and (suspected) exposure to other viral communicable diseases: Secondary | ICD-10-CM | POA: Diagnosis not present

## 2020-08-19 ENCOUNTER — Ambulatory Visit: Payer: Self-pay | Admitting: Family Medicine

## 2020-08-24 DIAGNOSIS — R69 Illness, unspecified: Secondary | ICD-10-CM | POA: Diagnosis not present

## 2020-08-25 ENCOUNTER — Ambulatory Visit (INDEPENDENT_AMBULATORY_CARE_PROVIDER_SITE_OTHER): Payer: Medicare HMO | Admitting: Family Medicine

## 2020-08-25 ENCOUNTER — Ambulatory Visit: Payer: Self-pay | Admitting: Family Medicine

## 2020-08-25 ENCOUNTER — Other Ambulatory Visit: Payer: Self-pay

## 2020-08-25 VITALS — BP 148/74 | HR 68 | Temp 97.8°F | Resp 15 | Ht 62.0 in | Wt 142.2 lb

## 2020-08-25 DIAGNOSIS — I10 Essential (primary) hypertension: Secondary | ICD-10-CM

## 2020-08-25 DIAGNOSIS — N1832 Chronic kidney disease, stage 3b: Secondary | ICD-10-CM | POA: Diagnosis not present

## 2020-08-25 DIAGNOSIS — Q845 Enlarged and hypertrophic nails: Secondary | ICD-10-CM

## 2020-08-25 DIAGNOSIS — I5189 Other ill-defined heart diseases: Secondary | ICD-10-CM

## 2020-08-25 DIAGNOSIS — E441 Mild protein-calorie malnutrition: Secondary | ICD-10-CM

## 2020-08-25 DIAGNOSIS — E1122 Type 2 diabetes mellitus with diabetic chronic kidney disease: Secondary | ICD-10-CM

## 2020-08-25 DIAGNOSIS — R69 Illness, unspecified: Secondary | ICD-10-CM | POA: Diagnosis not present

## 2020-08-25 DIAGNOSIS — F0150 Vascular dementia without behavioral disturbance: Secondary | ICD-10-CM

## 2020-08-25 MED ORDER — INSULIN DETEMIR 100 UNIT/ML ~~LOC~~ SOLN: SUBCUTANEOUS | 11 refills | Status: DC

## 2020-08-25 NOTE — Patient Instructions (Signed)
Referral to podiatry We will call with lab results Forms to be completed F/U  3 months

## 2020-08-25 NOTE — Progress Notes (Signed)
Subjective:    Patient ID: Betty [name], female    DOB: 1931-07-12, 86 y.o.   MRN: [id number]  Patient presents for Establish Care (pt needs to reestablish now in assisted living home needs forms checked off, pt denies questions and concerns for the day )  Patient here to reestablish care.  Since our last visit she went to a nursing facility at [facility] for approximately 2 months however family did not have the best experience there.  She has now transitioned to [facility] which is in a private she has aides and they also assist with her medications.  Is walking with a Rollator.  Her appetite has improved.  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  He was admitted to the hospital back in September secondary to acute renal failure and altered mental status.  Today she is here with her son.  They need forms completed for the [place] and for her new facility.  Medications reviewed diabetes mellitus there was a change in her formulary therefore she is on Levemir 4 units and she is on sliding scale  Recent CBGs have looked good most fastings are less than 150 in the afternoon they have been  up to 200s No hypoglycemia episodes  Hypertension she is continued on her metoprolol  Status post fracture with ORIF overall doing well walks with a Rollator.  Protein malnutrition she continues on mirtazapine 7.5 mg at bedtime  Iron deficiency she is on 325 mg once a day  Anxiety she is on BuSpar 5 mg twice a day, she is no longer on seroquel, but continued on Namzeric for mood She is on eliquis unknown reason will obtain records     Review Of Systems: unable to obtain complete from patient   GEN- denies fatigue, fever, weight loss,weakness, recent illness HEENT- denies eye drainage, change in vision, nasal discharge, CVS- denies chest pain, palpitations RESP- denies SOB, cough, wheeze ABD- denies N/V, change in stools, abd pain GU- denies  dysuria, hematuria, dribbling, incontinence MSK- denies joint pain, muscle aches, injury Neuro- denies headache, dizziness, syncope, seizure activity       Objective:    BP (!) 148/74   Pulse 68   Temp 97.8 F (36.6 C) (Temporal)   Resp 15   Ht 5\' 2"  (1.575 m)   Wt 142 lb 3.2 oz (64.5 kg)   SpO2 98%   BMI 26.01 kg/m  GEN- NAD, alert and oriented x person/place,  HEENT- PERRL, EOMI, non injected sclera, pink conjunctiva, MMM, oropharynx clear Neck- Supple, no thyromegaly CVS- RRR, no murmur RESP-CTAB ABD-NABS,soft,NT,ND EXT- No edema Pulses- Radial, DP- 2+        Assessment & Plan:      Problem List Items Addressed This Visit      Unprioritized   CKD (chronic kidney disease), stage III ([place])    Reassess renal function.      Dementia [facility])    She does require 24-hour assistance.  ContinueNamzaric      Relevant Medications   mirtazapine (REMERON) 15 MG tablet   Essential hypertension, benign    Controlled she is no longer on ARB or diuretic as she had renal failure few months ago.      Relevant Medications   ELIQUIS 2.5 MG TABS tablet   Grade I diastolic dysfunction   Mild protein malnutrition ([place])    Her weight has decreased since our last visit however since she has been in a  facility for the past few months but noted she is actually regaining weight on my I will review the records.  No change in her remeron today       Type II diabetes mellitus with renal manifestations ([place]) - Primary    Fairly well controlled Recheck A1c.  I will also obtain records from the nursing facility.  She had a lot of changes in her medications.      Relevant Medications   insulin detemir (LEVEMIR) 100 UNIT/ML injection   insulin lispro (HUMALOG) 100 UNIT/ML injection   Other Relevant Orders   CBC with Differential/Platelet (Completed)   Comprehensive metabolic panel (Completed)   Hemoglobin A1c (Completed)      Note: This dictation was prepared with Dragon  dictation along with smaller phrase technology. Any transcriptional errors that result from this process are unintentional.

## 2020-08-26 ENCOUNTER — Other Ambulatory Visit: Payer: Self-pay | Admitting: *Deleted

## 2020-08-26 ENCOUNTER — Encounter: Payer: Self-pay | Admitting: Family Medicine

## 2020-08-26 LAB — COMPREHENSIVE METABOLIC PANEL
AG Ratio: 1.3 (calc) (ref 1.0–2.5)
ALT: 9 U/L (ref 6–29)
AST: 15 U/L (ref 10–35)
Albumin: 3.5 g/dL — ABNORMAL LOW (ref 3.6–5.1)
Alkaline phosphatase (APISO): 76 U/L (ref 37–153)
BUN/Creatinine Ratio: 17 (calc) (ref 6–22)
BUN: 25 mg/dL (ref 7–25)
CO2: 27 mmol/L (ref 20–32)
Calcium: 8.7 mg/dL (ref 8.6–10.4)
Chloride: 108 mmol/L (ref 98–110)
Creat: 1.5 mg/dL — ABNORMAL HIGH (ref 0.60–0.88)
Globulin: 2.7 g/dL (calc) (ref 1.9–3.7)
Glucose, Bld: 330 mg/dL — ABNORMAL HIGH (ref 65–99)
Potassium: 4.6 mmol/L (ref 3.5–5.3)
Sodium: 142 mmol/L (ref 135–146)
Total Bilirubin: 0.2 mg/dL (ref 0.2–1.2)
Total Protein: 6.2 g/dL (ref 6.1–8.1)

## 2020-08-26 LAB — CBC WITH DIFFERENTIAL/PLATELET
Absolute Monocytes: 745 cells/uL (ref 200–950)
Basophils Absolute: 11 cells/uL (ref 0–200)
Basophils Relative: 0.2 %
Eosinophils Absolute: 38 cells/uL (ref 15–500)
Eosinophils Relative: 0.7 %
HCT: 31.9 % — ABNORMAL LOW (ref 35.0–45.0)
Hemoglobin: 9.9 g/dL — ABNORMAL LOW (ref 11.7–15.5)
Lymphs Abs: 1712 cells/uL (ref 850–3900)
MCH: 27.1 pg (ref 27.0–33.0)
MCHC: 31 g/dL — ABNORMAL LOW (ref 32.0–36.0)
MCV: 87.4 fL (ref 80.0–100.0)
MPV: 11.3 fL (ref 7.5–12.5)
Monocytes Relative: 13.8 %
Neutro Abs: 2894 cells/uL (ref 1500–7800)
Neutrophils Relative %: 53.6 %
Platelets: 197 10*3/uL (ref 140–400)
RBC: 3.65 10*6/uL — ABNORMAL LOW (ref 3.80–5.10)
RDW: 13.5 % (ref 11.0–15.0)
Total Lymphocyte: 31.7 %
WBC: 5.4 10*3/uL (ref 3.8–10.8)

## 2020-08-26 LAB — HEMOGLOBIN A1C
Hgb A1c MFr Bld: 6.9 % of total Hgb — ABNORMAL HIGH (ref <5.7)
Mean Plasma Glucose: 151 (calc)
eAG (mmol/L): 8.4 (calc)

## 2020-08-26 MED ORDER — INSULIN LISPRO 100 UNIT/ML ~~LOC~~ SOLN
SUBCUTANEOUS | 2 refills | Status: DC
Start: 2020-08-26 — End: 2020-08-26

## 2020-08-26 MED ORDER — NOVOLOG FLEXPEN 100 UNIT/ML ~~LOC~~ SOPN: PEN_INJECTOR | SUBCUTANEOUS | 11 refills | Status: DC

## 2020-08-26 MED ORDER — LEVEMIR FLEXTOUCH 100 UNIT/ML ~~LOC~~ SOPN: PEN_INJECTOR | SUBCUTANEOUS | 11 refills | Status: DC

## 2020-08-26 NOTE — Telephone Encounter (Signed)
Received call from pharmacy requesting clarification on insulin orders.   States that she requires SSI order. Prescription sent to pharmacy.

## 2020-08-26 NOTE — Assessment & Plan Note (Signed)
Her weight has decreased since our last visit however since she has been in a facility for the past few months but noted she is actually regaining weight on my I will review the records.  No change in her remeron today

## 2020-08-26 NOTE — Assessment & Plan Note (Signed)
Reassess renal function 

## 2020-08-26 NOTE — Assessment & Plan Note (Signed)
She does require 24-hour assistance.  ContinueNamzaric

## 2020-08-26 NOTE — Assessment & Plan Note (Signed)
Controlled she is no longer on ARB or diuretic as she had renal failure few months ago.

## 2020-08-26 NOTE — Assessment & Plan Note (Signed)
Fairly well controlled Recheck A1c.  I will also obtain records from the nursing facility.  She had a lot of changes in her medications.

## 2020-09-01 DIAGNOSIS — J310 Chronic rhinitis: Secondary | ICD-10-CM | POA: Diagnosis not present

## 2020-09-01 DIAGNOSIS — Z20828 Contact with and (suspected) exposure to other viral communicable diseases: Secondary | ICD-10-CM | POA: Diagnosis not present

## 2020-09-01 DIAGNOSIS — R519 Headache, unspecified: Secondary | ICD-10-CM | POA: Diagnosis not present

## 2020-09-02 ENCOUNTER — Encounter: Payer: Self-pay | Admitting: *Deleted

## 2020-09-02 ENCOUNTER — Other Ambulatory Visit: Payer: Self-pay | Admitting: *Deleted

## 2020-09-02 MED ORDER — FERROUS SULFATE 325 (65 FE) MG PO TABS
325.0000 mg | ORAL_TABLET | Freq: Two times a day (BID) | ORAL | 3 refills | Status: DC
Start: 2020-09-02 — End: 2020-09-07

## 2020-09-07 ENCOUNTER — Other Ambulatory Visit: Payer: Self-pay | Admitting: Family Medicine

## 2020-09-10 DIAGNOSIS — Z20828 Contact with and (suspected) exposure to other viral communicable diseases: Secondary | ICD-10-CM | POA: Diagnosis not present

## 2020-09-21 DIAGNOSIS — R69 Illness, unspecified: Secondary | ICD-10-CM | POA: Diagnosis not present

## 2020-09-28 ENCOUNTER — Other Ambulatory Visit: Payer: Self-pay | Admitting: Family Medicine

## 2020-10-27 ENCOUNTER — Telehealth: Payer: Self-pay | Admitting: *Deleted

## 2020-10-27 NOTE — Telephone Encounter (Signed)
Completed.

## 2020-10-27 NOTE — Telephone Encounter (Signed)
Received call from patient son, [place].   Requested to extend [name] forms for intermittent leave for care of patient.   Forms routed to PCP.

## 2020-11-04 DIAGNOSIS — R059 Cough, unspecified: Secondary | ICD-10-CM | POA: Diagnosis not present

## 2020-11-04 DIAGNOSIS — Z20828 Contact with and (suspected) exposure to other viral communicable diseases: Secondary | ICD-10-CM | POA: Diagnosis not present

## 2020-11-04 DIAGNOSIS — R0602 Shortness of breath: Secondary | ICD-10-CM | POA: Diagnosis not present

## 2020-11-05 DIAGNOSIS — Z20828 Contact with and (suspected) exposure to other viral communicable diseases: Secondary | ICD-10-CM | POA: Diagnosis not present

## 2020-12-01 DIAGNOSIS — J310 Chronic rhinitis: Secondary | ICD-10-CM | POA: Diagnosis not present

## 2020-12-01 DIAGNOSIS — Z20828 Contact with and (suspected) exposure to other viral communicable diseases: Secondary | ICD-10-CM | POA: Diagnosis not present

## 2020-12-01 DIAGNOSIS — R519 Headache, unspecified: Secondary | ICD-10-CM | POA: Diagnosis not present

## 2020-12-21 DIAGNOSIS — Z20828 Contact with and (suspected) exposure to other viral communicable diseases: Secondary | ICD-10-CM | POA: Diagnosis not present

## 2021-01-01 DIAGNOSIS — G4701 Insomnia due to medical condition: Secondary | ICD-10-CM | POA: Diagnosis not present

## 2021-01-01 DIAGNOSIS — R451 Restlessness and agitation: Secondary | ICD-10-CM | POA: Diagnosis not present

## 2021-01-01 DIAGNOSIS — E119 Type 2 diabetes mellitus without complications: Secondary | ICD-10-CM | POA: Diagnosis not present

## 2021-01-01 DIAGNOSIS — Z20828 Contact with and (suspected) exposure to other viral communicable diseases: Secondary | ICD-10-CM | POA: Diagnosis not present

## 2021-01-01 DIAGNOSIS — R69 Illness, unspecified: Secondary | ICD-10-CM | POA: Diagnosis not present

## 2021-01-12 ENCOUNTER — Telehealth: Payer: Self-pay | Admitting: Family Medicine

## 2021-01-12 NOTE — Progress Notes (Signed)
  Chronic Care Management   Outreach Note  01/12/2021 Name: PRIYA MATSEN MRN: [id number] DOB: [birth year]  Referred by: Alycia Rossetti, MD Reason for referral : No chief complaint on file.   An unsuccessful telephone outreach was attempted today. The patient was referred to the pharmacist for assistance with care management and care coordination.   Follow Up Plan:   Carley Perdue [employer] Scheduler

## 2021-01-12 NOTE — Progress Notes (Signed)
  Chronic Care Management   Note  01/12/2021 Name: Betty [name] MRN: [id number] DOB: July 20, 1931  [name] is a 87 y.o. year old female who is a primary care patient of [place], Modena Nunnery, MD. I reached out to Migdalia Dk by phone today in response to a referral sent by [name]'s PCP, Buelah Manis, Modena Nunnery, MD.   Ms. Brocious was given information about Chronic Care Management services today including:  1. CCM service includes personalized support from designated clinical staff supervised by her physician, including individualized plan of care and coordination with other care providers 2. 24/7 contact phone numbers for assistance for urgent and routine care needs. 3. Service will only be billed when office clinical staff spend 20 minutes or more in a month to coordinate care. 4. Only one practitioner may furnish and bill the service in a calendar month. 5. The patient may stop CCM services at any time (effective at the end of the month) by phone call to the office staff.   Patient wishes to consider information provided and/or speak with a member of the care team before deciding about enrollment in care management services.   Follow up plan:   Carley Perdue [employer] Scheduler

## 2021-01-15 DIAGNOSIS — G4701 Insomnia due to medical condition: Secondary | ICD-10-CM | POA: Diagnosis not present

## 2021-01-15 DIAGNOSIS — Z20828 Contact with and (suspected) exposure to other viral communicable diseases: Secondary | ICD-10-CM | POA: Diagnosis not present

## 2021-01-15 DIAGNOSIS — R69 Illness, unspecified: Secondary | ICD-10-CM | POA: Diagnosis not present

## 2021-01-15 DIAGNOSIS — E119 Type 2 diabetes mellitus without complications: Secondary | ICD-10-CM | POA: Diagnosis not present

## 2021-01-15 DIAGNOSIS — R451 Restlessness and agitation: Secondary | ICD-10-CM | POA: Diagnosis not present

## 2021-02-12 DIAGNOSIS — R519 Headache, unspecified: Secondary | ICD-10-CM | POA: Diagnosis not present

## 2021-02-12 DIAGNOSIS — J310 Chronic rhinitis: Secondary | ICD-10-CM | POA: Diagnosis not present

## 2021-02-12 DIAGNOSIS — Z20828 Contact with and (suspected) exposure to other viral communicable diseases: Secondary | ICD-10-CM | POA: Diagnosis not present

## 2021-03-01 DIAGNOSIS — Z20828 Contact with and (suspected) exposure to other viral communicable diseases: Secondary | ICD-10-CM | POA: Diagnosis not present

## 2021-03-01 DIAGNOSIS — R519 Headache, unspecified: Secondary | ICD-10-CM | POA: Diagnosis not present

## 2021-03-17 DIAGNOSIS — R69 Illness, unspecified: Secondary | ICD-10-CM | POA: Diagnosis not present

## 2021-03-17 DIAGNOSIS — R519 Headache, unspecified: Secondary | ICD-10-CM | POA: Diagnosis not present

## 2021-03-17 DIAGNOSIS — N184 Chronic kidney disease, stage 4 (severe): Secondary | ICD-10-CM | POA: Diagnosis not present

## 2021-03-17 DIAGNOSIS — I10 Essential (primary) hypertension: Secondary | ICD-10-CM | POA: Diagnosis not present

## 2021-03-17 DIAGNOSIS — E1122 Type 2 diabetes mellitus with diabetic chronic kidney disease: Secondary | ICD-10-CM | POA: Diagnosis not present

## 2021-03-17 DIAGNOSIS — G478 Other sleep disorders: Secondary | ICD-10-CM | POA: Diagnosis not present

## 2021-03-17 DIAGNOSIS — J310 Chronic rhinitis: Secondary | ICD-10-CM | POA: Diagnosis not present

## 2021-03-17 DIAGNOSIS — Z20828 Contact with and (suspected) exposure to other viral communicable diseases: Secondary | ICD-10-CM | POA: Diagnosis not present

## 2021-03-19 DIAGNOSIS — Z20828 Contact with and (suspected) exposure to other viral communicable diseases: Secondary | ICD-10-CM | POA: Diagnosis not present

## 2021-03-29 DIAGNOSIS — J310 Chronic rhinitis: Secondary | ICD-10-CM | POA: Diagnosis not present

## 2021-03-29 DIAGNOSIS — R519 Headache, unspecified: Secondary | ICD-10-CM | POA: Diagnosis not present

## 2021-03-29 DIAGNOSIS — I10 Essential (primary) hypertension: Secondary | ICD-10-CM | POA: Diagnosis not present

## 2021-03-29 DIAGNOSIS — N184 Chronic kidney disease, stage 4 (severe): Secondary | ICD-10-CM | POA: Diagnosis not present

## 2021-03-29 DIAGNOSIS — E1122 Type 2 diabetes mellitus with diabetic chronic kidney disease: Secondary | ICD-10-CM | POA: Diagnosis not present

## 2021-03-29 DIAGNOSIS — Z20828 Contact with and (suspected) exposure to other viral communicable diseases: Secondary | ICD-10-CM | POA: Diagnosis not present

## 2021-03-29 DIAGNOSIS — G478 Other sleep disorders: Secondary | ICD-10-CM | POA: Diagnosis not present

## 2021-04-13 DIAGNOSIS — E11649 Type 2 diabetes mellitus with hypoglycemia without coma: Secondary | ICD-10-CM | POA: Diagnosis not present

## 2021-04-13 DIAGNOSIS — N184 Chronic kidney disease, stage 4 (severe): Secondary | ICD-10-CM | POA: Diagnosis not present

## 2021-04-13 DIAGNOSIS — R519 Headache, unspecified: Secondary | ICD-10-CM | POA: Diagnosis not present

## 2021-04-13 DIAGNOSIS — I10 Essential (primary) hypertension: Secondary | ICD-10-CM | POA: Diagnosis not present

## 2021-04-13 DIAGNOSIS — G478 Other sleep disorders: Secondary | ICD-10-CM | POA: Diagnosis not present

## 2021-04-13 DIAGNOSIS — Z20828 Contact with and (suspected) exposure to other viral communicable diseases: Secondary | ICD-10-CM | POA: Diagnosis not present

## 2021-04-13 DIAGNOSIS — J310 Chronic rhinitis: Secondary | ICD-10-CM | POA: Diagnosis not present

## 2021-04-13 DIAGNOSIS — E1122 Type 2 diabetes mellitus with diabetic chronic kidney disease: Secondary | ICD-10-CM | POA: Diagnosis not present

## 2021-04-21 ENCOUNTER — Ambulatory Visit: Payer: Medicare HMO | Admitting: Nurse Practitioner

## 2021-05-25 ENCOUNTER — Other Ambulatory Visit: Payer: Self-pay

## 2021-05-25 ENCOUNTER — Encounter: Payer: Self-pay | Admitting: Nurse Practitioner

## 2021-05-25 ENCOUNTER — Ambulatory Visit (INDEPENDENT_AMBULATORY_CARE_PROVIDER_SITE_OTHER): Payer: Medicare HMO | Admitting: Nurse Practitioner

## 2021-05-25 VITALS — BP 174/68 | HR 76 | Temp 99.6°F | Ht 62.0 in | Wt 135.0 lb

## 2021-05-25 DIAGNOSIS — Z7689 Persons encountering health services in other specified circumstances: Secondary | ICD-10-CM

## 2021-05-25 DIAGNOSIS — R634 Abnormal weight loss: Secondary | ICD-10-CM

## 2021-05-25 DIAGNOSIS — E1122 Type 2 diabetes mellitus with diabetic chronic kidney disease: Secondary | ICD-10-CM

## 2021-05-25 DIAGNOSIS — I10 Essential (primary) hypertension: Secondary | ICD-10-CM

## 2021-05-25 DIAGNOSIS — N1832 Chronic kidney disease, stage 3b: Secondary | ICD-10-CM

## 2021-05-25 DIAGNOSIS — E782 Mixed hyperlipidemia: Secondary | ICD-10-CM

## 2021-05-25 DIAGNOSIS — D649 Anemia, unspecified: Secondary | ICD-10-CM | POA: Diagnosis not present

## 2021-05-25 NOTE — Assessment & Plan Note (Signed)
-  check lipids with next labs

## 2021-05-25 NOTE — Assessment & Plan Note (Signed)
-  takes iron supplement -will check iron panel with labs

## 2021-05-25 NOTE — Assessment & Plan Note (Signed)
-  check renal function with next set of labs

## 2021-05-25 NOTE — Assessment & Plan Note (Signed)
Wt Readings from Last 3 Encounters:  05/25/21 135 lb (61.2 kg)  08/25/20 142 lb 3.2 oz (64.5 kg)  06/25/20 150 lb (68 kg)   -Down 7 pounds in 9 months; has dementia

## 2021-05-25 NOTE — Assessment & Plan Note (Signed)
-  check A1c with next set of labs

## 2021-05-25 NOTE — Assessment & Plan Note (Signed)
-  obtain records 

## 2021-05-25 NOTE — Progress Notes (Signed)
New Patient Office Visit  Subjective:  Patient ID: Betty [name], female    DOB: 1931/05/25  Age: 87 y.o. MRN: [id number]  CC:  Chief Complaint  Patient presents with   New Patient (Initial Visit)    Here to establish care. No complaints today.    HPI Betty [name] presents for new patient visit. Transferring care from Dr. Buelah Manis. Last physical was 03/09/20. Last labs were drawn 08/25/20.  Medical hx listed below.  Past Medical History:  Diagnosis Date   Chronic kidney disease    stage II   Closed intertrochanteric fracture of femur ([place]) 03/27/2018   Dementia ([place])    Diabetes mellitus    Grade I diastolic dysfunction 6/62/9476   Hyperlipidemia    Hypertension     Past Surgical History:  Procedure Laterality Date   ABDOMINAL HYSTERECTOMY     BACK SURGERY     INTRAMEDULLARY (IM) NAIL INTERTROCHANTERIC Left 03/28/2018   Procedure: INTRAMEDULLARY (IM) NAIL INTERTROCHANTRIC;  Surgeon: Carole Civil, MD;  Location: [facility];  Service: Orthopedics;  Laterality: Left;    Family History  Problem Relation Age of Onset   Hypertension Mother    Cancer Father        colon    Cancer Sister        Breast cancer   Osteoporosis Sister     Social History   Socioeconomic History   Marital status: Widowed    Spouse name: Not on file   Number of children: 4   Years of education: Not on file   Highest education level: Not on file  Occupational History   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[employer]; Agricultural consultant and folded drapes  Tobacco Use   Smoking status: Never   Smokeless tobacco: Never  Vaping Use   Vaping Use: Never used  Substance and Sexual Activity   Alcohol use: No   Drug use: No   Sexual activity: Not Currently  Other Topics Concern   Not on file  Social History Narrative   2 sons and 2 daughters; 1 son deceased; 1 sister in town and 1 in [place] at [place] #2   Social Determinants of Health   Financial Resource  Strain: Not on file  Food Insecurity: Not on file  Transportation Needs: Not on file  Physical Activity: Not on file  Stress: Not on file  Social Connections: Not on file  Intimate Partner Violence: Not on file    ROS Review of Systems  Objective:   Today's Vitals: BP (!) 174/68 (BP Location: Left Arm, Patient Position: Sitting, Cuff Size: Normal)   Pulse 76   Temp 99.6 F (37.6 C) (Oral)   Ht 5' 2" (1.575 m)   Wt 135 lb (61.2 kg)   SpO2 98%   BMI 24.69 kg/m   Physical Exam  Assessment & Plan:   Problem List Items Addressed This Visit       Cardiovascular and Mediastinum   Essential hypertension, benign    BP Readings from Last 3 Encounters:  05/25/21 (!) 174/68  08/25/20 (!) 148/74  06/27/20 137/63  -BP elevated today; she has been agitated with dementia today -recheck in 2 weeks       Relevant Orders   CBC with Differential/Platelet   CMP14+EGFR   Lipid Panel With LDL/HDL Ratio     Endocrine   Type II diabetes mellitus with renal manifestations (HCC)    -check A1c with next set of labs  Relevant Orders   CBC with Differential/Platelet   CMP14+EGFR   Lipid Panel With LDL/HDL Ratio   Hemoglobin A1c     Genitourinary   CKD (chronic kidney disease), stage III ([place])    -check renal function with next set of labs      Relevant Orders   CMP14+EGFR     Other   Hyperlipidemia    -check lipids with next labs      Relevant Orders   CBC with Differential/Platelet   CMP14+EGFR   Lipid Panel With LDL/HDL Ratio   Weight loss    Wt Readings from Last 3 Encounters:  05/25/21 135 lb (61.2 kg)  08/25/20 142 lb 3.2 oz (64.5 kg)  06/25/20 150 lb (68 kg)  -Down 7 pounds in 9 months; has dementia      Postoperative anemia    -takes iron supplement -will check iron panel with labs      Relevant Orders   Iron, TIBC and Ferritin Panel   Encounter to establish care - Primary    -obtain records      Relevant Orders   CBC with  Differential/Platelet   CMP14+EGFR   Lipid Panel With LDL/HDL Ratio   Hemoglobin A1c   Iron, TIBC and Ferritin Panel    Outpatient Encounter Medications as of 05/25/2021  Medication Sig   BD PEN NEEDLE NANO U/F 32G X 4 MM MISC USE AS DIRECTED TO INJECT NOVOLOG 3 TIMES A DAY AND LANTUS AT BEDTIME.   Blood Glucose Monitoring Suppl (BLOOD GLUCOSE SYSTEM PAK) KIT Use as instructed for 3x daily testing. Dx: E11.22.   busPIRone (BUSPAR) 5 MG tablet TAKE 1 TABLET BY MOUTH TWICE DAILY.   calcium-vitamin D (OSCAL WITH D) 500-200 MG-UNIT tablet TAKE 1 TABLET BY MOUTH TWICE DAILY.   docusate sodium (COLACE) 100 MG capsule TAKE 1 CAPSULE BY MOUTH TWICE DAILY.   ELIQUIS 2.5 MG TABS tablet TAKE 1 TABLET BY MOUTH TWICE DAILY.   FEROSUL 325 (65 Fe) MG tablet TAKE 1 TABLET BY MOUTH TWICE DAILY WITH MEALS   insulin aspart (NOVOLOG FLEXPEN) 100 UNIT/ML FlexPen Inject per sliding scale 3x daily with meals: 151-200 2U, 201-250 4U, 251- 300 6U, 301-350 8U, 351-400 10U, <60/>400 call MD   insulin detemir (LEVEMIR FLEXTOUCH) 100 UNIT/ML FlexPen 6U QAM and 4U QHS   Lancets (ONETOUCH DELICA PLUS [id number]) MISC USE TO TEST BLOOD SUGAR UP TO FOUR TIMES DAILY (Patient taking differently: USE TO TEST BLOOD SUGAR 3 TIMES DAILY)   metoprolol tartrate (LOPRESSOR) 25 MG tablet TAKE 1 TABLET BY MOUTH TWICE DAILY.   mirtazapine (REMERON) 15 MG tablet TAKE 1/2 TABLET BY MOUTH AT BEDTIME.   NAMZARIC 28-10 MG CP24 TAKE 1 CAPSULE BY MOUTH AT BEDTIME.   ONETOUCH ULTRA test strip USE TO TEST BLOOD SUGAR UP TO FOUR TIMES DAILY   rosuvastatin (CRESTOR) 20 MG tablet TAKE 1 TABLET BY MOUTH AT BEDTIME.   No facility-administered encounter medications on file as of 05/25/2021.    Follow-up: Return in about 1 week (around 06/01/2021) for Physical Exam.   Noreene Larsson, NP

## 2021-05-25 NOTE — Patient Instructions (Addendum)
Please have fasting labs drawn 2-3 days prior to your appointment so we can discuss the results during your office visit.   We will complete [name] paperwork.  Please bring in any advanced directives like a DNR, MOST form, living will, or healthcare power of attorney.

## 2021-05-25 NOTE — Assessment & Plan Note (Addendum)
BP Readings from Last 3 Encounters:  05/25/21 (!) 174/68  08/25/20 (!) 148/74  06/27/20 137/63   -BP elevated today; she has been agitated with dementia today -recheck in 2 weeks

## 2021-05-27 ENCOUNTER — Telehealth: Payer: Self-pay

## 2021-05-27 NOTE — Telephone Encounter (Signed)
Spoke with pt. He is going to bring by this paperwork tomorrow.

## 2021-05-27 NOTE — Telephone Encounter (Signed)
Patient called  needs return his call before refaxing the [name] papers (Revision to forms) call back # [phone].

## 2021-05-28 ENCOUNTER — Telehealth: Payer: Self-pay

## 2021-05-28 NOTE — Telephone Encounter (Signed)
[name] forms   Copied Noted sleeved 

## 2021-05-31 DIAGNOSIS — B351 Tinea unguium: Secondary | ICD-10-CM | POA: Diagnosis not present

## 2021-05-31 DIAGNOSIS — E1151 Type 2 diabetes mellitus with diabetic peripheral angiopathy without gangrene: Secondary | ICD-10-CM | POA: Diagnosis not present

## 2021-06-04 DIAGNOSIS — R69 Illness, unspecified: Secondary | ICD-10-CM | POA: Diagnosis not present

## 2021-06-04 DIAGNOSIS — G4701 Insomnia due to medical condition: Secondary | ICD-10-CM | POA: Diagnosis not present

## 2021-06-04 DIAGNOSIS — F411 Generalized anxiety disorder: Secondary | ICD-10-CM | POA: Diagnosis not present

## 2021-06-09 DIAGNOSIS — Z0279 Encounter for issue of other medical certificate: Secondary | ICD-10-CM

## 2021-06-11 NOTE — Telephone Encounter (Signed)
Completed   FAXED --pt notified

## 2021-07-07 ENCOUNTER — Ambulatory Visit (INDEPENDENT_AMBULATORY_CARE_PROVIDER_SITE_OTHER): Payer: Medicare HMO

## 2021-07-07 ENCOUNTER — Other Ambulatory Visit: Payer: Self-pay

## 2021-07-07 ENCOUNTER — Encounter: Payer: Medicare HMO | Admitting: Nurse Practitioner

## 2021-07-07 VITALS — Ht 62.0 in | Wt 135.0 lb

## 2021-07-07 DIAGNOSIS — Z0000 Encounter for general adult medical examination without abnormal findings: Secondary | ICD-10-CM | POA: Diagnosis not present

## 2021-07-07 DIAGNOSIS — G4701 Insomnia due to medical condition: Secondary | ICD-10-CM | POA: Diagnosis not present

## 2021-07-07 DIAGNOSIS — R69 Illness, unspecified: Secondary | ICD-10-CM | POA: Diagnosis not present

## 2021-07-07 DIAGNOSIS — E785 Hyperlipidemia, unspecified: Secondary | ICD-10-CM | POA: Diagnosis not present

## 2021-07-07 DIAGNOSIS — F411 Generalized anxiety disorder: Secondary | ICD-10-CM | POA: Diagnosis not present

## 2021-07-07 NOTE — Progress Notes (Addendum)
Subjective:   Betty [name] is a 87 y.o. female who presents for Medicare Annual (Subsequent) preventive examination. Virtual Visit via Telephone Note  I connected with  Migdalia Dk on 07/07/21 at 11:00 AM EDT by telephone and verified that I am speaking with the correct person using two identifiers.  Location: Patient: [facility] Provider: [facility] Persons participating in the virtual visit: patient/Nurse Health Advisor   I discussed the limitations, risks, security and privacy concerns of performing an evaluation and management service by telephone and the availability of in person appointments. The patient expressed understanding and agreed to proceed.  Interactive audio and video telecommunications were attempted between this nurse and patient, however failed, due to patient having technical difficulties OR patient did not have access to video capability.  We continued and completed visit with audio only.  Some vital signs may be absent or patient reported.   Chriss Driver, LPN  Review of Systems           Objective:    Today's Vitals   07/07/21 1100  Weight: 135 lb (61.2 kg)  Height: 5' 2"  (1.575 m)  PainSc: 0-No pain   Body mass index is 24.69 kg/m.  Advanced Directives 07/07/2021 03/09/2020 05/03/2018 05/03/2018 04/17/2018 04/12/2018 04/11/2018  Does Patient Have a Medical Advance Directive? No Yes Yes Yes Yes Yes Yes  Type of Advance Directive - - (No Data) (No Data) (No Data) (No Data) (No Data)  Does patient want to make changes to medical advance directive? - No - Guardian declined No - Patient declined No - Patient declined No - Patient declined No - Patient declined No - Patient declined  Would patient like information on creating a medical advance directive? No - Patient declined - No - Patient declined No - Patient declined No - Patient declined No - Patient declined No - Patient declined    Current Medications (verified) Outpatient  Encounter Medications as of 07/07/2021  Medication Sig   BD PEN NEEDLE NANO U/F 32G X 4 MM MISC USE AS DIRECTED TO INJECT NOVOLOG 3 TIMES A DAY AND LANTUS AT BEDTIME.   Blood Glucose Monitoring Suppl (BLOOD GLUCOSE SYSTEM PAK) KIT Use as instructed for 3x daily testing. Dx: E11.22.   busPIRone (BUSPAR) 5 MG tablet TAKE 1 TABLET BY MOUTH TWICE DAILY.   calcium-vitamin D (OSCAL WITH D) 500-200 MG-UNIT tablet TAKE 1 TABLET BY MOUTH TWICE DAILY.   docusate sodium (COLACE) 100 MG capsule TAKE 1 CAPSULE BY MOUTH TWICE DAILY.   ELIQUIS 2.5 MG TABS tablet TAKE 1 TABLET BY MOUTH TWICE DAILY.   FEROSUL 325 (65 Fe) MG tablet TAKE 1 TABLET BY MOUTH TWICE DAILY WITH MEALS   insulin aspart (NOVOLOG FLEXPEN) 100 UNIT/ML FlexPen Inject per sliding scale 3x daily with meals: 151-200 2U, 201-250 4U, 251- 300 6U, 301-350 8U, 351-400 10U, <60/>400 call MD   insulin detemir (LEVEMIR FLEXTOUCH) 100 UNIT/ML FlexPen 6U QAM and 4U QHS   Lancets (ONETOUCH DELICA PLUS [id number]) MISC USE TO TEST BLOOD SUGAR UP TO FOUR TIMES DAILY (Patient taking differently: USE TO TEST BLOOD SUGAR 3 TIMES DAILY)   metoprolol tartrate (LOPRESSOR) 25 MG tablet TAKE 1 TABLET BY MOUTH TWICE DAILY.   mirtazapine (REMERON) 15 MG tablet TAKE 1/2 TABLET BY MOUTH AT BEDTIME.   NAMZARIC 28-10 MG CP24 TAKE 1 CAPSULE BY MOUTH AT BEDTIME.   ONETOUCH ULTRA test strip USE TO TEST BLOOD SUGAR UP TO FOUR TIMES DAILY   rosuvastatin (CRESTOR) 20 MG  tablet TAKE 1 TABLET BY MOUTH AT BEDTIME.   Alcohol Swabs (ALCOHOL PREP) PADS    GLOBAL EASE INJECT PEN NEEDLES 31G X 5 MM MISC Inject 1 Syringe into the skin 4 (four) times daily.   Lancet Devices (ONETOUCH DELICA PLUS LANCING) MISC    [DISCONTINUED] traZODone (DESYREL) 100 MG tablet Take 100 mg by mouth at bedtime.   [DISCONTINUED] TRUEPLUS PEN NEEDLES 31G X 8 MM MISC    No facility-administered encounter medications on file as of 07/07/2021.    Allergies (verified) Patient has no known allergies.    History: Past Medical History:  Diagnosis Date   Alzheimer disease ([place])    Chronic kidney disease    stage II   Closed intertrochanteric fracture of femur ([place]) 03/27/2018   Dementia ([place])    Diabetes mellitus    Grade I diastolic dysfunction 62/12/5595   Hyperlipidemia    Hypertension    Past Surgical History:  Procedure Laterality Date   ABDOMINAL HYSTERECTOMY     BACK SURGERY     INTRAMEDULLARY (IM) NAIL INTERTROCHANTERIC Left 03/28/2018   Procedure: INTRAMEDULLARY (IM) NAIL INTERTROCHANTRIC;  Surgeon: Carole Civil, MD;  Location: [facility];  Service: Orthopedics;  Laterality: Left;   Family History  Problem Relation Age of Onset   Hypertension Mother    Cancer Father        colon    Cancer Sister        Breast cancer   Osteoporosis Sister    Social History   Socioeconomic History   Marital status: Widowed    Spouse name: Not on file   Number of children: 4   Years of education: Not on file   Highest education level: Not on file  Occupational History   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   Social Determinants of Health   Financial Resource Strain: Low Risk    Difficulty of Paying Living Expenses: Not hard at all  Food Insecurity: No Food Insecurity   Worried About Charity fundraiser in the Last Year: Never true   [place] in the Last Year: Never true  Transportation Needs: No Transportation Needs   Lack of Transportation (Medical): No   Lack of Transportation (Non-Medical): No  Physical Activity: Sufficiently Active   Days of  Exercise per Week: 5 days   Minutes of Exercise per Session: 30 min  Stress: Not on file  Social Connections: Socially Isolated   Frequency of Communication with Friends and Family: Never   Frequency of Social Gatherings with Friends and Family: Twice a week   Attends Religious Services: Never   Marine scientist or Organizations: No   Attends Archivist Meetings: Never   Marital Status: Widowed    Tobacco Counseling Counseling given: Not Answered   Clinical Intake:  Pre-visit preparation completed: Yes  Pain : No/denies pain Pain Score: 0-No pain     BMI - recorded: 24.69 Nutritional Status: BMI 25 -29 Overweight Nutritional Risks: None Diabetes: Yes  How often do you need to have someone help you when you read instructions, pamphlets, or other written materials from your doctor or pharmacy?: 5 - Always  Diabetic? Nutrition Risk Assessment:  Has the patient had any N/V/D within the last 2 months?  No  Does the patient have any non-healing wounds?  No  Has the patient had any unintentional weight loss or weight gain?  No   Diabetes:  Is the patient diabetic?  Yes  If diabetic, was a CBG obtained today?  No  Did the patient bring in their glucometer from home?  No  Phone visit. How often do you monitor your CBG's? Daily.   Financial Strains and Diabetes Management:  Are you having any financial strains with the device, your supplies or your medication? No .  Does the patient want to be seen by Chronic Care Management for management of their diabetes?  No  Would the patient like to be referred to a Nutritionist or for Diabetic Management?  No   Diabetic Exams:  Diabetic Eye Exam: Completed 01/14/2015. Pt currently lives in assisted living facility due to Alzheimer's Dx.  Diabetic Foot Exam: Completed 07/29/2019. Pt currently lives in assisted living facility due to Alzheimer's Dx.   Interpreter Needed?: No  Comments: Pt currently lives at [facility] [place]. Pt has Alzheimers. Information entered by :: MJ Joniel Graumann, LPN   Activities of Daily Living In your present state of health, do you have any difficulty performing the following activities: 07/07/2021  Hearing? N  Vision? N  Difficulty concentrating or making decisions? Y  Walking or climbing stairs? Y  Dressing or bathing? Y  Doing errands, shopping? Y  Preparing Food and eating ? Y  Using the Toilet? Y  In the past six months, have you accidently leaked urine? Y  Do you have problems with loss of bowel control? Y  Managing your Medications? Y  Managing your Finances? Y  Housekeeping or managing your Housekeeping? Y  Some recent data might be hidden    Patient Care Team: Noreene Larsson, NP as PCP - General (Nurse Practitioner)  Indicate any recent Medical Services you may have received from other than [facility] providers in the past year (date may be approximate).     Assessment:   This is a routine wellness examination for [place].  Hearing/Vision screen Hearing Screening - Comments:: Hears well. Vision Screening - Comments:: No glasses. Lives in [place].   Dietary issues and exercise activities discussed: Current Exercise Habits: Structured exercise class, Type of exercise: walking, Time (Minutes): 30, Frequency (Times/Week): 5, Weekly Exercise (Minutes/Week): 150, Intensity: Mild, Exercise limited by: cardiac condition(s);neurologic condition(s)   Goals Addressed             This Visit's Progress    Have 3 meals a day        Depression Screen PHQ 2/9 Scores 07/07/2021 05/25/2021 03/09/2020 11/06/2019 07/29/2019 04/24/2019 09/19/2018  PHQ - 2 Score - 0 - 0 0 0 0  PHQ- 9 Score - - - - - - -  Exception Documentation Medical reason - Medical reason - - - -    Fall Risk Fall Risk  07/07/2021 05/25/2021 03/09/2020 11/06/2019 07/29/2019  Falls in the past year? 0 0 0 0 0  Number falls in past yr: 0 0 - - -  Injury with Fall? 0 0 - - -  Risk for fall due  to : Impaired mobility;Impaired vision;Impaired balance/gait No Fall Risks Impaired balance/gait;Impaired mobility;Medication side  effect;Mental status change Impaired balance/gait;Impaired mobility -  Follow up - Falls evaluation completed Falls evaluation completed Falls evaluation completed Falls evaluation completed    FALL RISK PREVENTION PERTAINING TO THE HOME:  Any stairs in or around the home? No  If so, are there any without handrails? No  Home free of loose throw rugs in walkways, pet beds, electrical cords, etc? Yes  Adequate lighting in your home to reduce risk of falls? Yes   ASSISTIVE DEVICES UTILIZED TO PREVENT FALLS:  Life alert? No  Use of a cane, walker or w/c? Yes  Grab bars in the bathroom? Yes  Shower chair or bench in shower? Yes  Elevated toilet seat or a handicapped toilet? Yes   TIMED UP AND GO:  Was the test performed? No . Phone visit.  Cognitive Function: Patient has current diagnosis of cognitive impairment. Patient is unable to complete screening 6CIT or MMSE.          Immunizations Immunization History  Administered Date(s) Administered   Fluad Quad(high Dose 65+) 07/29/2019   Influenza Split 06/21/2012   Influenza, High Dose Seasonal PF 07/11/2017, 09/19/2018   Influenza,inj,Quad PF,6+ Mos 07/02/2013, 07/30/2014, 09/04/2015, 08/05/2016   Influenza-Unspecified 08/02/2001, 07/17/2002, 07/27/2006   PFIZER(Purple Top)SARS-COV-2 Vaccination 11/24/2019, 12/18/2019   Pneumococcal Conjugate-13 11/01/2013   Pneumococcal Polysaccharide-23 03/29/2012   Td 03/14/2000   Zoster, Live 09/20/2012    TDAP status: Due, Education has been provided regarding the importance of this vaccine. Advised may receive this vaccine at local pharmacy or Health Dept. Aware to provide a copy of the vaccination record if obtained from local pharmacy or Health Dept. Verbalized acceptance and understanding.  Flu Vaccine status: Due, Education has been provided regarding the  importance of this vaccine. Advised may receive this vaccine at local pharmacy or Health Dept. Aware to provide a copy of the vaccination record if obtained from local pharmacy or Health Dept. Verbalized acceptance and understanding.  Pneumococcal vaccine status: Up to date  Covid-19 vaccine status: Information provided on how to obtain vaccines.   Qualifies for Shingles Vaccine? Yes   Zostavax completed Yes   Shingrix Completed?: No.    Education has been provided regarding the importance of this vaccine. Patient has been advised to call insurance company to determine out of pocket expense if they have not yet received this vaccine. Advised may also receive vaccine at local pharmacy or Health Dept. Verbalized acceptance and understanding.  Screening Tests Health Maintenance  Topic Date Due   Zoster Vaccines- Shingrix (1 of 2) Never done   DEXA SCAN  Never done   OPHTHALMOLOGY EXAM  01/14/2016   URINE MICROALBUMIN  09/03/2016   COVID-19 Vaccine (3 - Pfizer risk series) 01/15/2020   FOOT EXAM  07/28/2020   HEMOGLOBIN A1C  02/22/2021   INFLUENZA VACCINE  05/03/2021   TETANUS/TDAP  05/25/2022 (Originally 03/14/2010)   HPV VACCINES  Aged Out    Health Maintenance  Health Maintenance Due  Topic Date Due   Zoster Vaccines- Shingrix (1 of 2) Never done   DEXA SCAN  Never done   OPHTHALMOLOGY EXAM  01/14/2016   URINE MICROALBUMIN  09/03/2016   COVID-19 Vaccine (3 - Pfizer risk series) 01/15/2020   FOOT EXAM  07/28/2020   HEMOGLOBIN A1C  02/22/2021   INFLUENZA VACCINE  05/03/2021    Colorectal cancer screening: No longer required.   Mammogram status: No longer required due to age.  Bone Density: Pt unable to complete due to cognitive impairment.   Lung Cancer  Screening: (Low Dose CT Chest recommended if Age 2-80 years, 30 pack-year currently smoking OR have quit w/in 15years.) does not qualify.     Additional Screening:  Hepatitis C Screening: does not qualify  Vision  Screening: Recommended annual ophthalmology exams for early detection of glaucoma and other disorders of the eye. Is the patient up to date with their annual eye exam?  No  Who is the provider or what is the name of the office in which the patient attends annual eye exams? N/A If pt is not established with a provider, would they like to be referred to a provider to establish care? No .   Dental Screening: Recommended annual dental exams for proper oral hygiene  Community Resource Referral / Chronic Care Management: CRR required this visit?  No   CCM required this visit?  No      Plan:     I have personally reviewed and noted the following in the patient's chart:   Medical and social history Use of alcohol, tobacco or illicit drugs  Current medications and supplements including opioid prescriptions.  Functional ability and status Nutritional status Physical activity Advanced directives List of other physicians Hospitalizations, surgeries, and ER visits in previous 12 months Vitals Screenings to include cognitive, depression, and falls Referrals and appointments  In addition, I have reviewed and discussed with patient certain preventive protocols, quality metrics, and best practice recommendations. A written personalized care plan for preventive services as well as general preventive health recommendations were provided to patient.     Chriss Driver, LPN   45/0/3888   Nurse Notes: Pt currently living in assisted living, [facility] in [place]. Spoke with care giver and was able to do most of AWV. Pt is scheduled to have flu vaccine at facility next week.

## 2021-07-07 NOTE — Patient Instructions (Signed)
Betty [name] , Thank you for taking time to come for your Medicare Wellness Visit. I appreciate your ongoing commitment to your health goals. Please review the following plan we discussed and let me know if I can assist you in the future.   Screening recommendations/referrals: Colonoscopy: No longer required due to age. Mammogram: No longer required due to age. Bone Density: Unable to attend. Recommended yearly ophthalmology/optometry visit for glaucoma screening and checkup Recommended yearly dental visit for hygiene and checkup  Vaccinations: Influenza vaccine: Scheduled for next week. Pneumococcal vaccine: Done 03/29/2012 11/01/2013 Tdap vaccine: Done 03/14/2000 Repeat in 10 years  Shingles vaccine: Done 09/20/2012    Covid-19:Done 11/24/19 and 12/18/19.  Advanced directives: Advance directive discussed with you today. Even though you declined this today, please call our office should you change your mind, and we can give you the proper paperwork for you to fill out.   Conditions/risks identified: Aim for 30 minutes of exercise or walking each day, drink 6-8 glasses of water and eat lots of fruits and vegetables.   Next appointment: Follow up in one year for your annual wellness visit 2023.   Preventive Care 85 Years and Older, Female Preventive care refers to lifestyle choices and visits with your health care provider that can promote health and wellness. What does preventive care include? A yearly physical exam. This is also called an annual well check. Dental exams once or twice a year. Routine eye exams. Ask your health care provider how often you should have your eyes checked. Personal lifestyle choices, including: Daily care of your teeth and gums. Regular physical activity. Eating a healthy diet. Avoiding tobacco and drug use. Limiting alcohol use. Practicing safe sex. Taking low-dose aspirin every day. Taking vitamin and mineral supplements as recommended by your health  care provider. What happens during an annual well check? The services and screenings done by your health care provider during your annual well check will depend on your age, overall health, lifestyle risk factors, and family history of disease. Counseling  Your health care provider may ask you questions about your: Alcohol use. Tobacco use. Drug use. Emotional well-being. Home and relationship well-being. Sexual activity. Eating habits. History of falls. Memory and ability to understand (cognition). Work and work Statistician. Reproductive health. Screening  You may have the following tests or measurements: Height, weight, and BMI. Blood pressure. Lipid and cholesterol levels. These may be checked every 5 years, or more frequently if you are over 27 years old. Skin check. Lung cancer screening. You may have this screening every year starting at age 85 if you have a 30-pack-year history of smoking and currently smoke or have quit within the past 15 years. Fecal occult blood test (FOBT) of the stool. You may have this test every year starting at age 85. Flexible sigmoidoscopy or colonoscopy. You may have a sigmoidoscopy every 5 years or a colonoscopy every 10 years starting at age 85. Hepatitis C blood test. Hepatitis B blood test. Sexually transmitted disease (STD) testing. Diabetes screening. This is done by checking your blood sugar (glucose) after you have not eaten for a while (fasting). You may have this done every 1-3 years. Bone density scan. This is done to screen for osteoporosis. You may have this done starting at age 85. Mammogram. This may be done every 1-2 years. Talk to your health care provider about how often you should have regular mammograms. Talk with your health care provider about your test results, treatment options, and if necessary, the  need for more tests. Vaccines  Your health care provider may recommend certain vaccines, such as: Influenza vaccine. This is  recommended every year. Tetanus, diphtheria, and acellular pertussis (Tdap, Td) vaccine. You may need a Td booster every 10 years. Zoster vaccine. You may need this after age 85. Pneumococcal 13-valent conjugate (PCV13) vaccine. One dose is recommended after age 85. Pneumococcal polysaccharide (PPSV23) vaccine. One dose is recommended after age 8. Talk to your health care provider about which screenings and vaccines you need and how often you need them. This information is not intended to replace advice given to you by your health care provider. Make sure you discuss any questions you have with your health care provider. Document Released: 10/16/2015 Document Revised: 06/08/2016 Document Reviewed: 07/21/2015 Elsevier Interactive Patient Education  2017 [place] Prevention in the Home Falls can cause injuries. They can happen to people of all ages. There are many things you can do to make your home safe and to help prevent falls. What can I do on the outside of my home? Regularly fix the edges of walkways and driveways and fix any cracks. Remove anything that might make you trip as you walk through a door, such as a raised step or threshold. Trim any bushes or trees on the path to your home. Use bright outdoor lighting. Clear any walking paths of anything that might make someone trip, such as rocks or tools. Regularly check to see if handrails are loose or broken. Make sure that both sides of any steps have handrails. Any raised decks and porches should have guardrails on the edges. Have any leaves, snow, or ice cleared regularly. Use sand or salt on walking paths during winter. Clean up any spills in your garage right away. This includes oil or grease spills. What can I do in the bathroom? Use night lights. Install grab bars by the toilet and in the tub and shower. Do not use towel bars as grab bars. Use non-skid mats or decals in the tub or shower. If you need to sit down in  the shower, use a plastic, non-slip stool. Keep the floor dry. Clean up any water that spills on the floor as soon as it happens. Remove soap buildup in the tub or shower regularly. Attach bath mats securely with double-sided non-slip rug tape. Do not have throw rugs and other things on the floor that can make you trip. What can I do in the bedroom? Use night lights. Make sure that you have a light by your bed that is easy to reach. Do not use any sheets or blankets that are too big for your bed. They should not hang down onto the floor. Have a firm chair that has side arms. You can use this for support while you get dressed. Do not have throw rugs and other things on the floor that can make you trip. What can I do in the kitchen? Clean up any spills right away. Avoid walking on wet floors. Keep items that you use a lot in easy-to-reach places. If you need to reach something above you, use a strong step stool that has a grab bar. Keep electrical cords out of the way. Do not use floor polish or wax that makes floors slippery. If you must use wax, use non-skid floor wax. Do not have throw rugs and other things on the floor that can make you trip. What can I do with my stairs? Do not leave any items on the  stairs. Make sure that there are handrails on both sides of the stairs and use them. Fix handrails that are broken or loose. Make sure that handrails are as long as the stairways. Check any carpeting to make sure that it is firmly attached to the stairs. Fix any carpet that is loose or worn. Avoid having throw rugs at the top or bottom of the stairs. If you do have throw rugs, attach them to the floor with carpet tape. Make sure that you have a light switch at the top of the stairs and the bottom of the stairs. If you do not have them, ask someone to add them for you. What else can I do to help prevent falls? Wear shoes that: Do not have high heels. Have rubber bottoms. Are comfortable  and fit you well. Are closed at the toe. Do not wear sandals. If you use a stepladder: Make sure that it is fully opened. Do not climb a closed stepladder. Make sure that both sides of the stepladder are locked into place. Ask someone to hold it for you, if possible. Clearly mark and make sure that you can see: Any grab bars or handrails. First and last steps. Where the edge of each step is. Use tools that help you move around (mobility aids) if they are needed. These include: Canes. Walkers. Scooters. Crutches. Turn on the lights when you go into a dark area. Replace any light bulbs as soon as they burn out. Set up your furniture so you have a clear path. Avoid moving your furniture around. If any of your floors are uneven, fix them. If there are any pets around you, be aware of where they are. Review your medicines with your doctor. Some medicines can make you feel dizzy. This can increase your chance of falling. Ask your doctor what other things that you can do to help prevent falls. This information is not intended to replace advice given to you by your health care provider. Make sure you discuss any questions you have with your health care provider. Document Released: 07/16/2009 Document Revised: 02/25/2016 Document Reviewed: 10/24/2014 Elsevier Interactive Patient Education  2017 [employer].

## 2021-08-30 DIAGNOSIS — B351 Tinea unguium: Secondary | ICD-10-CM | POA: Diagnosis not present

## 2021-08-30 DIAGNOSIS — E1151 Type 2 diabetes mellitus with diabetic peripheral angiopathy without gangrene: Secondary | ICD-10-CM | POA: Diagnosis not present

## 2021-09-10 DIAGNOSIS — R69 Illness, unspecified: Secondary | ICD-10-CM | POA: Diagnosis not present

## 2021-09-10 DIAGNOSIS — Z593 Problems related to living in residential institution: Secondary | ICD-10-CM | POA: Diagnosis not present

## 2021-09-10 DIAGNOSIS — E1122 Type 2 diabetes mellitus with diabetic chronic kidney disease: Secondary | ICD-10-CM | POA: Diagnosis not present

## 2021-10-01 DIAGNOSIS — E1122 Type 2 diabetes mellitus with diabetic chronic kidney disease: Secondary | ICD-10-CM | POA: Diagnosis not present

## 2021-10-01 DIAGNOSIS — N1832 Chronic kidney disease, stage 3b: Secondary | ICD-10-CM | POA: Diagnosis not present

## 2021-10-01 DIAGNOSIS — E782 Mixed hyperlipidemia: Secondary | ICD-10-CM | POA: Diagnosis not present

## 2021-10-01 DIAGNOSIS — Z7689 Persons encountering health services in other specified circumstances: Secondary | ICD-10-CM | POA: Diagnosis not present

## 2021-10-01 DIAGNOSIS — I10 Essential (primary) hypertension: Secondary | ICD-10-CM | POA: Diagnosis not present

## 2021-10-01 DIAGNOSIS — D649 Anemia, unspecified: Secondary | ICD-10-CM | POA: Diagnosis not present

## 2021-10-02 LAB — IRON,TIBC AND FERRITIN PANEL
Ferritin: 1066 ng/mL — ABNORMAL HIGH (ref 15–150)
Iron Saturation: 21 % (ref 15–55)
Iron: 42 ug/dL (ref 27–139)
Total Iron Binding Capacity: 197 ug/dL — ABNORMAL LOW (ref 250–450)
UIBC: 155 ug/dL (ref 118–369)

## 2021-10-02 LAB — CBC WITH DIFFERENTIAL/PLATELET
Basophils Absolute: 0 10*3/uL (ref 0.0–0.2)
Basos: 0 %
EOS (ABSOLUTE): 0.1 10*3/uL (ref 0.0–0.4)
Eos: 1 %
Hematocrit: 33.5 % — ABNORMAL LOW (ref 34.0–46.6)
Hemoglobin: 10.8 g/dL — ABNORMAL LOW (ref 11.1–15.9)
Immature Grans (Abs): 0 10*3/uL (ref 0.0–0.1)
Immature Granulocytes: 0 %
Lymphocytes Absolute: 2.6 10*3/uL (ref 0.7–3.1)
Lymphs: 30 %
MCH: 26.5 pg — ABNORMAL LOW (ref 26.6–33.0)
MCHC: 32.2 g/dL (ref 31.5–35.7)
MCV: 82 fL (ref 79–97)
Monocytes Absolute: 0.8 10*3/uL (ref 0.1–0.9)
Monocytes: 9 %
Neutrophils Absolute: 5.2 10*3/uL (ref 1.4–7.0)
Neutrophils: 60 %
Platelets: 188 10*3/uL (ref 150–450)
RBC: 4.07 x10E6/uL (ref 3.77–5.28)
RDW: 13 % (ref 11.7–15.4)
WBC: 8.8 10*3/uL (ref 3.4–10.8)

## 2021-10-02 LAB — HEMOGLOBIN A1C
Est. average glucose Bld gHb Est-mCnc: 157 mg/dL
Hgb A1c MFr Bld: 7.1 % — ABNORMAL HIGH (ref 4.8–5.6)

## 2021-10-02 LAB — CMP14+EGFR
ALT: 26 IU/L (ref 0–32)
AST: 25 IU/L (ref 0–40)
Albumin/Globulin Ratio: 1.4 (ref 1.2–2.2)
Albumin: 3.7 g/dL (ref 3.6–4.6)
Alkaline Phosphatase: 92 IU/L (ref 44–121)
BUN/Creatinine Ratio: 17 (ref 12–28)
BUN: 22 mg/dL (ref 8–27)
Bilirubin Total: 0.2 mg/dL (ref 0.0–1.2)
CO2: 27 mmol/L (ref 20–29)
Calcium: 9.2 mg/dL (ref 8.7–10.3)
Chloride: 105 mmol/L (ref 96–106)
Creatinine, Ser: 1.33 mg/dL — ABNORMAL HIGH (ref 0.57–1.00)
Globulin, Total: 2.7 g/dL (ref 1.5–4.5)
Glucose: 95 mg/dL (ref 70–99)
Potassium: 4.1 mmol/L (ref 3.5–5.2)
Sodium: 142 mmol/L (ref 134–144)
Total Protein: 6.4 g/dL (ref 6.0–8.5)
eGFR: 38 mL/min/{1.73_m2} — ABNORMAL LOW (ref >59)

## 2021-10-02 LAB — LIPID PANEL WITH LDL/HDL RATIO
Cholesterol, Total: 160 mg/dL (ref 100–199)
HDL: 52 mg/dL (ref >39)
LDL Chol Calc (NIH): 87 mg/dL (ref 0–99)
LDL/HDL Ratio: 1.7 ratio (ref 0.0–3.2)
Triglycerides: 119 mg/dL (ref 0–149)
VLDL Cholesterol Cal: 21 mg/dL (ref 5–40)

## 2021-10-06 ENCOUNTER — Encounter: Payer: Self-pay | Admitting: Nurse Practitioner

## 2021-10-06 ENCOUNTER — Telehealth: Payer: Self-pay | Admitting: Nurse Practitioner

## 2021-10-06 ENCOUNTER — Encounter: Payer: Medicare HMO | Admitting: Nurse Practitioner

## 2021-10-06 ENCOUNTER — Other Ambulatory Visit: Payer: Self-pay

## 2021-10-06 ENCOUNTER — Ambulatory Visit (INDEPENDENT_AMBULATORY_CARE_PROVIDER_SITE_OTHER): Payer: Medicare HMO | Admitting: Nurse Practitioner

## 2021-10-06 VITALS — BP 178/84 | HR 65 | Ht 62.0 in | Wt 140.0 lb

## 2021-10-06 DIAGNOSIS — I10 Essential (primary) hypertension: Secondary | ICD-10-CM

## 2021-10-06 DIAGNOSIS — Z780 Asymptomatic menopausal state: Secondary | ICD-10-CM | POA: Diagnosis not present

## 2021-10-06 DIAGNOSIS — E1122 Type 2 diabetes mellitus with diabetic chronic kidney disease: Secondary | ICD-10-CM | POA: Diagnosis not present

## 2021-10-06 DIAGNOSIS — E782 Mixed hyperlipidemia: Secondary | ICD-10-CM | POA: Diagnosis not present

## 2021-10-06 DIAGNOSIS — Z0000 Encounter for general adult medical examination without abnormal findings: Secondary | ICD-10-CM

## 2021-10-06 DIAGNOSIS — N1832 Chronic kidney disease, stage 3b: Secondary | ICD-10-CM

## 2021-10-06 DIAGNOSIS — D649 Anemia, unspecified: Secondary | ICD-10-CM

## 2021-10-06 DIAGNOSIS — Z0001 Encounter for general adult medical examination with abnormal findings: Secondary | ICD-10-CM

## 2021-10-06 MED ORDER — HYDROCHLOROTHIAZIDE 12.5 MG PO TABS: 12.5000 mg | ORAL_TABLET | Freq: Every day | ORAL | 3 refills | Status: DC

## 2021-10-06 NOTE — Assessment & Plan Note (Addendum)
DASH diet and commitment to daily physical activity for a minimum of 30 minutes discussed and encouraged, as a part of hypertension management. The importance of attaining a healthy weight is also discussed.  BP/Weight 10/06/2021 07/07/2021 05/25/2021 08/25/2020 06/27/2020 06/25/2020 4/51/4604  Systolic BP 799 - 872 158 727 - 618  Diastolic BP 84 - 68 74 63 - 44  Wt. (Lbs) 140 135 135 142.2 - 150 152  BMI 25.61 24.69 24.69 26.01 - 27.44 28.72  takes metoprolol 25mg  daily. Start hydrochlorothiazide 12.5mg  daily Recheck bp in 4 weeks  10/07/20. Pt's on called refusing hydrochlorothiazide. Pt educated on the need to get pt's BP under control he verbalized understanding.

## 2021-10-06 NOTE — Assessment & Plan Note (Signed)
Annual exam as documented.  ?Counseling done include healthy lifestyle involving committing to 150 minutes of exercise per week, heart healthy diet, and attaining healthy weight. The importance of adequate sleep also discussed.  ?Regular use of seat belt and home safety were also discussed . ?Immunization   needs are specifically addressed at this visit.   ?

## 2021-10-06 NOTE — Progress Notes (Addendum)
Established Patient Office Visit  Subjective:  Patient ID: Betty [name], female    DOB: Mar 03, 1931  Age: 88 y.o. MRN: [id number]  CC:  Chief Complaint  Patient presents with   Annual Exam    Annual Exam - No concerns    HPI Betty [name] presents for Annual exam. Due for shingles vaccine, diabetic foot exam,eye exam. Pt is accompanied by her son Betty [name] and her caregiver. Lives at [facility] facility in [place]. Pt has dementia, pt's son and cargiver assisted with answering question today.   10/07/2020. Pts' son Betty [name] called the office and I spoke with him. He and his sister do now want pt to see nephrologist, hematologist. He stated that they do not want PT to be on hydrochlorothiazide and do not want her to be on any new BP meds because she has been stable,She was previously dehydrated from taking hydrochlorothiazide. Mr Betty [name] agrees to reducing iron pill to once a day and rechecking Iron, TIBC  ferritin levels in 6 weeks. Pts son also refused DEXA scan. Pt's son educated on the need to get pt's BP under control , he verbalized understanding.      Past Medical History:  Diagnosis Date   Alzheimer disease ([place])    Chronic kidney disease    stage II   Closed intertrochanteric fracture of femur ([place]) 03/27/2018   Dementia ([place])    Diabetes mellitus    Grade I diastolic dysfunction 07/62/2633   Hyperlipidemia    Hypertension     Past Surgical History:  Procedure Laterality Date   ABDOMINAL HYSTERECTOMY     BACK SURGERY     INTRAMEDULLARY (IM) NAIL INTERTROCHANTERIC Left 03/28/2018   Procedure: INTRAMEDULLARY (IM) NAIL INTERTROCHANTRIC;  Surgeon: Carole Civil, MD;  Location: [facility];  Service: Orthopedics;  Laterality: Left;    Family History  Problem Relation Age of Onset   Hypertension Mother    Cancer Father        colon    Cancer Sister        Breast cancer   Osteoporosis Sister     Social History   Socioeconomic History   Marital  status: Widowed    Spouse name: Not on file   Number of children: 4   Years of education: Not on file   Highest education level: Not on file  Occupational History   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   Social Determinants of Health   Financial Resource Strain: Low Risk    Difficulty of Paying Living Expenses: Not hard at all  Food Insecurity: No Food Insecurity   Worried About Charity fundraiser in the Last Year: Never true   [place] in the Last Year: Never true  Transportation Needs: No Transportation Needs   Lack of Transportation (Medical): No   Lack of Transportation (Non-Medical): No  Physical Activity: Sufficiently Active   Days of Exercise per Week: 5 days   Minutes of Exercise per Session: 30 min  Stress: Not on file  Social Connections: Socially Isolated   Frequency of Communication with Friends and Family: Never   Frequency of Social Gatherings with Friends and Family: Twice a week   Attends Religious Services: Never   Marine scientist or Organizations: No   Attends Archivist Meetings: Never   Marital Status: Widowed  Human resources officer Violence: Not on file    Outpatient Medications Prior to Visit  Medication Sig Dispense Refill   Alcohol Swabs (ALCOHOL PREP) PADS      BD PEN NEEDLE NANO U/F 32G X 4 MM MISC USE AS DIRECTED TO INJECT NOVOLOG 3 TIMES A DAY AND LANTUS AT BEDTIME. 200 each 0   Blood Glucose Monitoring Suppl (BLOOD GLUCOSE SYSTEM PAK) KIT Use as instructed for 3x daily testing. Dx: E11.22. 1 kit 1   busPIRone  (BUSPAR) 5 MG tablet TAKE 1 TABLET BY MOUTH TWICE DAILY. 60 tablet 11   calcium-vitamin D (OSCAL WITH D) 500-200 MG-UNIT tablet TAKE 1 TABLET BY MOUTH TWICE DAILY. 60 tablet 11   docusate sodium (COLACE) 100 MG capsule TAKE 1 CAPSULE BY MOUTH TWICE DAILY. 60 capsule 11   ELIQUIS 2.5 MG TABS tablet TAKE 1 TABLET BY MOUTH TWICE DAILY. 79 tablet 11   FEROSUL 325 (65 Fe) MG tablet TAKE 1 TABLET BY MOUTH TWICE DAILY WITH MEALS 30 tablet 11   GLOBAL EASE INJECT PEN NEEDLES 31G X 5 MM MISC Inject 1 Syringe into the skin 4 (four) times daily.     insulin aspart (NOVOLOG FLEXPEN) 100 UNIT/ML FlexPen Inject per sliding scale 3x daily with meals: 151-200 2U, 201-250 4U, 251- 300 6U, 301-350 8U, 351-400 10U, <60/>400 call MD 15 mL 11   insulin detemir (LEVEMIR FLEXTOUCH) 100 UNIT/ML FlexPen 6U QAM and 4U QHS 15 mL 11   Lancet Devices (ONETOUCH DELICA PLUS LANCING) MISC      Lancets (ONETOUCH DELICA PLUS [id number]) MISC USE TO TEST BLOOD SUGAR UP TO FOUR TIMES DAILY (Patient taking differently: USE TO TEST BLOOD SUGAR 3 TIMES DAILY) 100 each 11   metoprolol tartrate (LOPRESSOR) 25 MG tablet TAKE 1 TABLET BY MOUTH TWICE DAILY. 79 tablet 11   mirtazapine (REMERON) 15 MG tablet TAKE 1/2 TABLET BY MOUTH AT BEDTIME. 20 tablet 11   NAMZARIC 28-10 MG CP24 TAKE 1 CAPSULE BY MOUTH AT BEDTIME. 40 capsule 11   ONETOUCH ULTRA test strip USE TO TEST BLOOD SUGAR UP TO FOUR TIMES DAILY 100 strip 11   rosuvastatin (CRESTOR) 20 MG tablet TAKE 1 TABLET BY MOUTH AT BEDTIME. 40 tablet 11   No facility-administered medications prior to visit.    No Known Allergies  ROS Review of Systems  Constitutional: Negative.   HENT: Negative.    Eyes: Negative.   Respiratory: Negative.    Cardiovascular: Negative.   Gastrointestinal: Negative.   Endocrine: Negative.   Genitourinary: Negative.   Musculoskeletal: Negative.        Uses a rolling walker  Skin: Negative.   Allergic/Immunologic: Negative.   Neurological: Negative.    Hematological: Negative.   Psychiatric/Behavioral: Negative.         Has dementia     Objective:    Physical Exam Constitutional:      General: She is not in acute distress.    Appearance: She is normal weight. She is not ill-appearing, toxic-appearing or diaphoretic.  HENT:     Right Ear: External ear normal.     Left Ear: External ear normal.  Nose: No congestion or rhinorrhea.     Mouth/Throat:     Mouth: Mucous membranes are moist.     Pharynx: Oropharynx is clear. No oropharyngeal exudate or posterior oropharyngeal erythema.  Eyes:     General: No scleral icterus.       Right eye: No discharge.        Left eye: No discharge.  Neck:     Vascular: No carotid bruit.  Cardiovascular:     Rate and Rhythm: Normal rate and regular rhythm.     Pulses: Normal pulses.     Heart sounds: Normal heart sounds. No murmur heard.   No friction rub. No gallop.  Pulmonary:     Effort: Pulmonary effort is normal. No respiratory distress.     Breath sounds: Normal breath sounds. No stridor. No wheezing, rhonchi or rales.  Chest:     Chest wall: No mass, lacerations or tenderness.  Breasts:    Tanner Score is 5.     Right: Normal. No swelling, bleeding, inverted nipple, mass, nipple discharge, skin change or tenderness.     Left: Normal. No swelling, bleeding, inverted nipple, mass, nipple discharge, skin change or tenderness.     Comments: Mole noted under left breast, will monitor, not new per pt's son. Abdominal:     General: There is no distension.     Palpations: Abdomen is soft. There is no mass.     Tenderness: There is no abdominal tenderness. There is no right CVA tenderness, left CVA tenderness or guarding.  Musculoskeletal:        General: No swelling, tenderness or signs of injury.     Cervical back: No rigidity or tenderness.     Right lower leg: No edema.     Left lower leg: No edema.     Comments: Uses a walker, some limitation in mobility of lower extremities  noted. Able to walk with her rollator  Lymphadenopathy:     Cervical: No cervical adenopathy.  Skin:    General: Skin is warm and dry.     Capillary Refill: Capillary refill takes less than 2 seconds.     Coloration: Skin is not jaundiced or pale.     Findings: No bruising, erythema, lesion or rash.  Neurological:     Mental Status: She is alert. Mental status is at baseline.     Cranial Nerves: No cranial nerve deficit.     Sensory: No sensory deficit.     Motor: Weakness present.     Gait: Gait abnormal.     Comments: Has dementia, pt is very plesant  Psychiatric:        Mood and Affect: Mood normal.        Behavior: Behavior normal.    BP (!) 178/84 (BP Location: Left Arm, Cuff Size: Normal)    Pulse 65    Ht 5' 2"  (1.575 m)    Wt 140 lb (63.5 kg)    SpO2 98%    BMI 25.61 kg/m  Wt Readings from Last 3 Encounters:  10/06/21 140 lb (63.5 kg)  07/07/21 135 lb (61.2 kg)  05/25/21 135 lb (61.2 kg)     Health Maintenance Due  Topic Date Due   Zoster Vaccines- Shingrix (1 of 2) Never done   DEXA SCAN  Never done   OPHTHALMOLOGY EXAM  01/14/2016   URINE MICROALBUMIN  09/03/2016   FOOT EXAM  07/28/2020   COVID-19 Vaccine (3 - Booster for [place] series) 09/24/2021  There are no preventive care reminders to display for this patient.  Lab Results  Component Value Date   TSH 0.423 01/11/2016   Lab Results  Component Value Date   WBC 8.8 10/01/2021   HGB 10.8 (L) 10/01/2021   HCT 33.5 (L) 10/01/2021   MCV 82 10/01/2021   PLT 188 10/01/2021   Lab Results  Component Value Date   NA 142 10/01/2021   K 4.1 10/01/2021   CO2 27 10/01/2021   GLUCOSE 95 10/01/2021   BUN 22 10/01/2021   CREATININE 1.33 (H) 10/01/2021   BILITOT 0.2 10/01/2021   ALKPHOS 92 10/01/2021   AST 25 10/01/2021   ALT 26 10/01/2021   PROT 6.4 10/01/2021   ALBUMIN 3.7 10/01/2021   CALCIUM 9.2 10/01/2021   ANIONGAP 14 06/27/2020   EGFR 38 (L) 10/01/2021   Lab Results  Component Value Date    CHOL 160 10/01/2021   Lab Results  Component Value Date   HDL 52 10/01/2021   Lab Results  Component Value Date   LDLCALC 87 10/01/2021   Lab Results  Component Value Date   TRIG 119 10/01/2021   Lab Results  Component Value Date   CHOLHDL 2.9 11/06/2019   Lab Results  Component Value Date   HGBA1C 7.1 (H) 10/01/2021      Assessment & Plan:   Problem List Items Addressed This Visit       Cardiovascular and Mediastinum   Essential hypertension, benign    DASH diet and commitment to daily physical activity for a minimum of 30 minutes discussed and encouraged, as a part of hypertension management. The importance of attaining a healthy weight is also discussed.  BP/Weight 10/06/2021 07/07/2021 05/25/2021 08/25/2020 06/27/2020 06/25/2020 7/59/1638  Systolic BP 466 - 599 357 017 - 793  Diastolic BP 71 - 68 74 63 - 44  Wt. (Lbs) 140 135 135 142.2 - 150 152  BMI 25.61 24.69 24.69 26.01 - 27.44 28.72           Relevant Medications   hydrochlorothiazide (HYDRODIURIL) 12.5 MG tablet   Other Relevant Orders   CMP14+EGFR     Endocrine   Type II diabetes mellitus with renal manifestations (HCC)    Lab Results  Component Value Date   HGBA1C 7.1 (H) 10/01/2021  continue insulin       Relevant Orders   Microalbumin, urine     Genitourinary   CKD (chronic kidney disease), stage III ([place])   Relevant Orders   Ambulatory referral to Nephrology     Other   Hyperlipidemia    Lipid level is stable. Continue crestor 49m daily.      Relevant Medications   hydrochlorothiazide (HYDRODIURIL) 12.5 MG tablet   Annual physical exam    Annual exam as documented.  Counseling done include healthy lifestyle involving committing to 150 minutes of exercise per week, heart healthy diet, and attaining healthy weight. The importance of adequate sleep also discussed.  Regular use of seat belt and home safety were also discussed .  Immunization needs are specifically addressed at this  visit.        Other Visit Diagnoses     Post-menopausal    -  Primary   Relevant Orders   DG Bone Density   Chronic anemia       Relevant Orders   Ambulatory referral to Hematology / Oncology       Meds ordered this encounter  Medications   hydrochlorothiazide (HYDRODIURIL) 12.5 MG tablet  Sig: Take 1 tablet (12.5 mg total) by mouth daily.    Dispense:  90 tablet    Refill:  3    Follow-up: Return in about 4 weeks (around 11/03/2021) for recheck BP and kidney function. Renee Rival, FNP

## 2021-10-06 NOTE — Assessment & Plan Note (Addendum)
Lab Results  Component Value Date   HGBA1C 7.1 (H) 10/01/2021  continue current med. Foot exam done today Urine micro albumin ordered DM eye exam ordered.

## 2021-10-06 NOTE — Telephone Encounter (Signed)
Pt called back with questions from appt today

## 2021-10-06 NOTE — Patient Instructions (Addendum)
°  Continue metoprolol 25mg  daily.  Take ferrous sulfate  325 mg once daily at breakfast, will recheck iron levels at next visit.  It is important that you exercise regularly at least 30 minutes 5 times a week.  Think about what you will eat, plan ahead. Choose " clean, green, fresh or frozen" over canned, processed or packaged foods which are more sugary, salty and fatty. 70 to 75% of food eaten should be vegetables and fruit. Three meals at set times with snacks allowed between meals, but they must be fruit or vegetables. Aim to eat over a 12 hour period , example 7 am to 7 pm, and STOP after  your last meal of the day. Drink water,generally about 64 ounces per day, no other drink is as healthy. Fruit juice is best enjoyed in a healthy way, by EATING the fruit.  Thanks for choosing [facility], we consider it a privelige to serve

## 2021-10-06 NOTE — Assessment & Plan Note (Signed)
Lipid level is stable. Continue crestor 20mg  daily.

## 2021-10-06 NOTE — Assessment & Plan Note (Addendum)
Refer to nephrology.  Pt's son refused referal

## 2021-10-06 NOTE — Assessment & Plan Note (Addendum)
Chronic anemia, elevated ferritin levels, low TIBC, discontinue ferosul 352mg  BID. Refer to hematology Has chronic kidney disease.  .  10/07/2020 pts son would like to cut down ferosoul to one a day and recheck labs, he refused hematology referral.

## 2021-10-07 ENCOUNTER — Telehealth: Payer: Self-pay

## 2021-10-07 MED ORDER — FERROUS SULFATE 325 (65 FE) MG PO TABS: 325.0000 mg | ORAL_TABLET | Freq: Every day | ORAL | 11 refills | Status: DC

## 2021-10-07 NOTE — Addendum Note (Signed)
Addended by: Renee Rival on: 10/07/2021 03:27 PM   Modules accepted: Orders

## 2021-10-07 NOTE — Telephone Encounter (Signed)
Patient son left voice mail  returning call to [facility]. Call back # [phone]

## 2021-10-07 NOTE — Telephone Encounter (Signed)
Patient son asked if clinic nurse will give him a call at [phone] ASAP about high blood pressure medicine.    Needs some clarification about this medicine about blood pressure. Confused. Was called into pharmacy concern aid pick it up and does not know if should take this medicine or not.  Concern of dehydration with this medicine.  Please call son about this medicine whether to stop or continue to take medicine.  Discuss a letter would be sent with the AVS.    Needs a letter to let aid know what all medicine should take or not.

## 2021-10-07 NOTE — Addendum Note (Signed)
Addended by: Renee Rival on: 10/07/2021 08:05 AM   Modules accepted: Orders

## 2021-10-08 ENCOUNTER — Encounter: Payer: Self-pay | Admitting: *Deleted

## 2021-10-08 NOTE — Telephone Encounter (Signed)
Patient informed. 

## 2021-10-08 NOTE — Telephone Encounter (Signed)
Patient son needs a letter saying to stop the high blood pressure medicine if that is what the provider would like for patient to do and iron pill has been added. Needs this letter for the aid that manages her prescription.

## 2021-10-08 NOTE — Telephone Encounter (Signed)
Letter provided

## 2021-10-08 NOTE — Telephone Encounter (Signed)
I gave pts son a new AVS yesterday with changes updated. There is no new bp med on the AVS from yesterday , I also wrote on the AVS page about the changes made to the pt's iron pill.   Pt son should please present the new AVS  with updated medication list to the pt's caregiver. Thank you.

## 2021-10-08 NOTE — Telephone Encounter (Signed)
Please let pt son know and we can give him an extra avs if needed

## 2021-10-22 DIAGNOSIS — E119 Type 2 diabetes mellitus without complications: Secondary | ICD-10-CM | POA: Diagnosis not present

## 2021-11-04 DIAGNOSIS — D649 Anemia, unspecified: Secondary | ICD-10-CM | POA: Diagnosis not present

## 2021-11-05 LAB — IRON,TIBC AND FERRITIN PANEL
Ferritin: 1036 ng/mL — ABNORMAL HIGH (ref 15–150)
Iron Saturation: 46 % (ref 15–55)
Iron: 98 ug/dL (ref 27–139)
Total Iron Binding Capacity: 214 ug/dL — ABNORMAL LOW (ref 250–450)
UIBC: 116 ug/dL — ABNORMAL LOW (ref 118–369)

## 2021-11-08 ENCOUNTER — Ambulatory Visit (INDEPENDENT_AMBULATORY_CARE_PROVIDER_SITE_OTHER): Payer: Medicare HMO | Admitting: Nurse Practitioner

## 2021-11-08 ENCOUNTER — Encounter: Payer: Self-pay | Admitting: Nurse Practitioner

## 2021-11-08 ENCOUNTER — Telehealth: Payer: Self-pay

## 2021-11-08 ENCOUNTER — Other Ambulatory Visit: Payer: Self-pay

## 2021-11-08 VITALS — BP 140/58 | HR 62 | Ht 61.0 in | Wt 139.0 lb

## 2021-11-08 DIAGNOSIS — I10 Essential (primary) hypertension: Secondary | ICD-10-CM

## 2021-11-08 DIAGNOSIS — E1122 Type 2 diabetes mellitus with diabetic chronic kidney disease: Secondary | ICD-10-CM

## 2021-11-08 DIAGNOSIS — R7989 Other specified abnormal findings of blood chemistry: Secondary | ICD-10-CM | POA: Diagnosis not present

## 2021-11-08 DIAGNOSIS — D649 Anemia, unspecified: Secondary | ICD-10-CM

## 2021-11-08 MED ORDER — HYDROCHLOROTHIAZIDE 12.5 MG PO TABS: 12.5000 mg | ORAL_TABLET | Freq: Every day | ORAL | 3 refills | Status: DC

## 2021-11-08 NOTE — Assessment & Plan Note (Signed)
Lab Results  Component Value Date   FERRITIN 1,036 (H) 11/04/2021  Pt's family agreed to hematology referral today. They had previously refused referral to hematology.  continue  Ferrous sulfate 325mg  daily with breakfast.

## 2021-11-08 NOTE — Telephone Encounter (Signed)
[name] forms  Noted Copied Sleeved  Contact Clayvon Tencza (Son)  [phone] when forms are completed and faxed to [facility] at [phone]

## 2021-11-08 NOTE — Assessment & Plan Note (Signed)
Continue current med.  Schedule for diabetic eye exam today. Urine microalbumin test pending, pt's caregiver encouraged to get pt's urine sample for this test, they verbalized understanding.

## 2021-11-08 NOTE — Assessment & Plan Note (Signed)
DASH diet and commitment to daily physical activity for a minimum of 30 minutes discussed and encouraged, as a part of hypertension management. The importance of attaining a healthy weight is also discussed.  BP/Weight 11/08/2021 10/06/2021 07/07/2021 05/25/2021 08/25/2020 06/27/2020 2/50/8719  Systolic BP 941 290 - 475 339 179 -  Diastolic BP 58 84 - 68 74 63 -  Wt. (Lbs) 139 140 135 135 142.2 - 150  BMI 26.26 25.61 24.69 24.69 26.01 - 27.44  pt's adult children stated that the pt has been taking hydrochlorothiazide 12.5mg  daily with metoprolol 25mg  BID. They had refused pt starting Hydrochlorothiazide due to concerns of dehydration with this med in the past, but they did change their minds and allowed pt to take hydrochlorothiazide.  Today they  denies any adverse reactions from taking [place] since pt stared taking med 4 weeks ago.  Will check CMP today.

## 2021-11-08 NOTE — Patient Instructions (Addendum)
Please get your shingles vaccines and TDAP booster at your pharmacy. Please schedule your diabetic eye exam today.    Continue hydrochlorothiazide 12.5 mg daily for your blood pressure.  You have been referred to hematology for your anemia and high ferritin level.      Eat a healthy diet, including lots of fruits and vegetables. Avoid foods with a lot of saturated and trans fats, such as red meat, butter, fried foods and cheese . Maintain a healthy weight.

## 2021-11-08 NOTE — Progress Notes (Signed)
° °  Betty [name]     MRN: [id number]      DOB: [birth year]   HPI Ms. Shimada is here for follow up and re-evaluation of chronic medical conditions, medication management and review of any available recent lab and radiology data.  Preventive health is updated, specifically  Cancer screening and Immunization.   Questions or concerns regarding consultations or procedures which the PT has had in the interim are  addressed. The PT denies any adverse reactions to current medications since the last visit.  There are no new concerns.  There are no specific complaints   Pt was accompanied by her grown up children and her care giver to today's visit.  They reported that the pt has been taking HTZC 12.5 mg daily and Iron supplement, they rea not sure if the pt is taking iron supplement twice or daily. They denied any adverse reaction to [facility] since the pt started taking med about 4 weeks ago.   They would like to complete an advanced directive forms to make pt a DNR. They also brought  [name] forms to be completed as well.      ROS Denies recent fever or chills. Denies sinus pressure, nasal congestion, ear pain or sore throat. Denies chest congestion, productive cough or wheezing. Denies chest pains, palpitations and leg swelling Denies abdominal pain, nausea, vomiting,diarrhea or constipation.   Denies dysuria, frequency, hesitancy or incontinence. Denies joint pain, swelling and limitation in mobility. Denies headaches, seizures, numbness, or tingling.  PE  BP (!) 140/58 (BP Location: Left Arm, Cuff Size: Normal)    Pulse 62    Ht 5\' 1"  (1.549 m)    Wt 139 lb (63 kg)    SpO2 98%    BMI 26.26 kg/m   Patient alert and oriented and in no cardiopulmonary distress, has dementia.   Chest: Clear to auscultation bilaterally.  CVS: S1, S2 no murmurs, no S3.Regular rate.  ABD: Soft non tender.   Ext: No edema  MS: Adequate ROM spine, shoulders, hips and knees.  Psych: Good eye contact,  normal affect. Memory intact not anxious or depressed appearing.    Assessment & Plan

## 2021-11-09 ENCOUNTER — Telehealth: Payer: Self-pay

## 2021-11-09 ENCOUNTER — Other Ambulatory Visit: Payer: Self-pay | Admitting: Nurse Practitioner

## 2021-11-09 DIAGNOSIS — N179 Acute kidney failure, unspecified: Secondary | ICD-10-CM

## 2021-11-09 DIAGNOSIS — Z0279 Encounter for issue of other medical certificate: Secondary | ICD-10-CM

## 2021-11-09 LAB — CMP14+EGFR
ALT: 13 IU/L (ref 0–32)
AST: 14 IU/L (ref 0–40)
Albumin/Globulin Ratio: 1.5 (ref 1.2–2.2)
Albumin: 3.7 g/dL (ref 3.6–4.6)
Alkaline Phosphatase: 82 IU/L (ref 44–121)
BUN/Creatinine Ratio: 21 (ref 12–28)
BUN: 36 mg/dL — ABNORMAL HIGH (ref 8–27)
Bilirubin Total: <0.2 mg/dL (ref 0.0–1.2)
CO2: 26 mmol/L (ref 20–29)
Calcium: 9 mg/dL (ref 8.7–10.3)
Chloride: 102 mmol/L (ref 96–106)
Creatinine, Ser: 1.7 mg/dL — ABNORMAL HIGH (ref 0.57–1.00)
Globulin, Total: 2.4 g/dL (ref 1.5–4.5)
Glucose: 384 mg/dL — ABNORMAL HIGH (ref 70–99)
Potassium: 4.2 mmol/L (ref 3.5–5.2)
Sodium: 142 mmol/L (ref 134–144)
Total Protein: 6.1 g/dL (ref 6.0–8.5)
eGFR: 28 mL/min/{1.73_m2} — ABNORMAL LOW (ref >59)

## 2021-11-09 NOTE — Telephone Encounter (Signed)
Janett Billow from [facility] called about clarification on Hydrochlorothiazide 12.5 mg. Please return Janett Billow call at [phone]

## 2021-11-09 NOTE — Progress Notes (Signed)
I spoke with pt's son MR Santellan about the need to stop hydrochlorothiazide, due to AKI, Pt's  son requested a letter that he can give to the facility in regards to this. I told pt's son that I will follow up with the pt in one month to recheck her kidney function and BP. He verbalized understanding.

## 2021-11-09 NOTE — Telephone Encounter (Signed)
Spoke with Betty [name] administrator of facility advised to him that the pt should not be taking hydrochloride due to kidney he verbalized understanding stating that she has stopped taking this medication faxed over letter and lab orders advised pt needs to f/u in 4 weeks with labs 3-5 days prior.

## 2021-11-10 NOTE — Telephone Encounter (Signed)
Called jessica at pharmacy advised that per [facility] pt does not need to take the hydrochlorothiazide

## 2021-11-11 NOTE — Telephone Encounter (Signed)
Forms faxed and contacted son Yvone Neu forms are ready

## 2021-11-15 ENCOUNTER — Other Ambulatory Visit (HOSPITAL_COMMUNITY): Payer: Medicare HMO

## 2021-11-19 ENCOUNTER — Encounter (HOSPITAL_COMMUNITY): Payer: Self-pay | Admitting: Hematology

## 2021-11-19 ENCOUNTER — Inpatient Hospital Stay (HOSPITAL_COMMUNITY): Payer: Medicare HMO | Attending: Hematology | Admitting: Hematology

## 2021-11-19 ENCOUNTER — Inpatient Hospital Stay (HOSPITAL_COMMUNITY): Payer: Medicare HMO

## 2021-11-19 ENCOUNTER — Other Ambulatory Visit: Payer: Self-pay

## 2021-11-19 DIAGNOSIS — N189 Chronic kidney disease, unspecified: Secondary | ICD-10-CM | POA: Diagnosis not present

## 2021-11-19 DIAGNOSIS — Z803 Family history of malignant neoplasm of breast: Secondary | ICD-10-CM | POA: Diagnosis not present

## 2021-11-19 DIAGNOSIS — Z8781 Personal history of (healed) traumatic fracture: Secondary | ICD-10-CM | POA: Insufficient documentation

## 2021-11-19 DIAGNOSIS — R69 Illness, unspecified: Secondary | ICD-10-CM | POA: Diagnosis not present

## 2021-11-19 DIAGNOSIS — Z7901 Long term (current) use of anticoagulants: Secondary | ICD-10-CM | POA: Insufficient documentation

## 2021-11-19 DIAGNOSIS — D649 Anemia, unspecified: Secondary | ICD-10-CM | POA: Diagnosis not present

## 2021-11-19 DIAGNOSIS — R7989 Other specified abnormal findings of blood chemistry: Secondary | ICD-10-CM

## 2021-11-19 DIAGNOSIS — Z800 Family history of malignant neoplasm of digestive organs: Secondary | ICD-10-CM | POA: Insufficient documentation

## 2021-11-19 DIAGNOSIS — F0390 Unspecified dementia without behavioral disturbance: Secondary | ICD-10-CM | POA: Insufficient documentation

## 2021-11-19 DIAGNOSIS — Z720 Tobacco use: Secondary | ICD-10-CM | POA: Insufficient documentation

## 2021-11-19 LAB — CBC WITH DIFFERENTIAL/PLATELET
Abs Immature Granulocytes: 0.02 10*3/uL (ref 0.00–0.07)
Basophils Absolute: 0 10*3/uL (ref 0.0–0.1)
Basophils Relative: 0 %
Eosinophils Absolute: 0 10*3/uL (ref 0.0–0.5)
Eosinophils Relative: 1 %
HCT: 33 % — ABNORMAL LOW (ref 36.0–46.0)
Hemoglobin: 10.1 g/dL — ABNORMAL LOW (ref 12.0–15.0)
Immature Granulocytes: 0 %
Lymphocytes Relative: 24 %
Lymphs Abs: 1.8 10*3/uL (ref 0.7–4.0)
MCH: 26.4 pg (ref 26.0–34.0)
MCHC: 30.6 g/dL (ref 30.0–36.0)
MCV: 86.2 fL (ref 80.0–100.0)
Monocytes Absolute: 0.7 10*3/uL (ref 0.1–1.0)
Monocytes Relative: 9 %
Neutro Abs: 4.9 10*3/uL (ref 1.7–7.7)
Neutrophils Relative %: 66 %
Platelets: 186 10*3/uL (ref 150–400)
RBC: 3.83 MIL/uL — ABNORMAL LOW (ref 3.87–5.11)
RDW: 13.9 % (ref 11.5–15.5)
WBC: 7.4 10*3/uL (ref 4.0–10.5)
nRBC: 0 % (ref 0.0–0.2)

## 2021-11-19 LAB — RETICULOCYTES
Immature Retic Fract: 6.7 % (ref 2.3–15.9)
RBC.: 3.77 MIL/uL — ABNORMAL LOW (ref 3.87–5.11)
Retic Count, Absolute: 54.3 10*3/uL (ref 19.0–186.0)
Retic Ct Pct: 1.4 % (ref 0.4–3.1)

## 2021-11-19 LAB — VITAMIN B12: Vitamin B-12: 407 pg/mL (ref 180–914)

## 2021-11-19 LAB — FOLATE: Folate: 13.2 ng/mL (ref >5.9)

## 2021-11-19 LAB — LACTATE DEHYDROGENASE: LDH: 181 U/L (ref 98–192)

## 2021-11-19 NOTE — Progress Notes (Signed)
[facility] [address], [place] [address]   CLINIC:  Medical Oncology/Hematology  Patient Care Team: [place], Dewaine Conger, FNP as PCP - General (Nurse Practitioner) Derek Jack, MD as Medical Oncologist (Hematology)  CHIEF COMPLAINTS/PURPOSE OF CONSULTATION:  Evaluation of normocytic anemia  HISTORY OF PRESENTING ILLNESS:  RAEVIN [name] 88 y.o. female is here because of evaluation of anemia, at the request of [place].  Today she reports feeling good, and she is accompanied by her daughter who is acting as primary historian as the patient has a history of dementia. She denies any current bleeding and black stools. She denies increased fatigue. She broke her left hip in 2019 and has walked with a limp since then. She is unaware of history of blood transfusions. She as not previously had a colonoscopy. She is taking Eliquis and iron tablets. She walks with the assistance of a walker. Her appetite is good, and her weight is stable.   She currently lives at [facility] adult care home and has lived there for 1 year. Prior to retirement she worked at [employer]. She denies smoking history.  She denies family history of anemia. Her sister had breast cancer.   MEDICAL HISTORY:  Past Medical History:  Diagnosis Date   Alzheimer disease ([place])    Chronic kidney disease    stage II   Closed intertrochanteric fracture of femur ([place]) 03/27/2018   Dementia ([place])    Diabetes mellitus    Grade I diastolic dysfunction 49/67/5916   Hyperlipidemia    Hypertension     SURGICAL HISTORY: Past Surgical History:  Procedure Laterality Date   ABDOMINAL HYSTERECTOMY     BACK SURGERY     INTRAMEDULLARY (IM) NAIL INTERTROCHANTERIC Left 03/28/2018   Procedure: INTRAMEDULLARY (IM) NAIL INTERTROCHANTRIC;  Surgeon: Carole Civil, MD;  Location: [facility];  Service: Orthopedics;  Laterality: Left;    SOCIAL HISTORY: Social History   Socioeconomic History    Marital status: Widowed    Spouse name: Not on file   Number of children: 4   Years of education: Not on file   Highest education level: Not on file  Occupational History   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   Social Determinants of Health   Financial Resource Strain: Low Risk    Difficulty of Paying Living Expenses: Not hard at all  Food Insecurity: No Food Insecurity   Worried About Charity fundraiser in the Last Year: Never true   [place] in the Last Year: Never true  Transportation Needs: No Transportation Needs   Lack of Transportation (Medical): No   Lack of Transportation (Non-Medical): No  Physical Activity: Sufficiently Active   Days of Exercise per Week: 5 days   Minutes of Exercise per Session: 30 min  Stress: Not on file  Social Connections: Socially Isolated   Frequency of Communication with Friends and Family: Never   Frequency of Social Gatherings with Friends and Family: Twice a week  Attends Religious Services: Never   Active Member of Clubs or Organizations: No   Attends Archivist Meetings: Never   Marital Status: Widowed  Human resources officer Violence: Not on file    FAMILY HISTORY: Family History  Problem Relation Age of Onset   Hypertension Mother    Cancer Father        colon    Cancer Sister        Breast cancer   Osteoporosis Sister     ALLERGIES:  has No Known Allergies.  MEDICATIONS:  Current Outpatient Medications  Medication Sig Dispense Refill   Alcohol Swabs (ALCOHOL PREP) PADS      BD PEN NEEDLE  NANO U/F 32G X 4 MM MISC USE AS DIRECTED TO INJECT NOVOLOG 3 TIMES A DAY AND LANTUS AT BEDTIME. 200 each 0   Blood Glucose Monitoring Suppl (BLOOD GLUCOSE SYSTEM PAK) KIT Use as instructed for 3x daily testing. Dx: E11.22. 1 kit 1   busPIRone (BUSPAR) 5 MG tablet TAKE 1 TABLET BY MOUTH TWICE DAILY. 60 tablet 11   calcium-vitamin D (OSCAL WITH D) 500-200 MG-UNIT tablet TAKE 1 TABLET BY MOUTH TWICE DAILY. 60 tablet 11   docusate sodium (COLACE) 100 MG capsule TAKE 1 CAPSULE BY MOUTH TWICE DAILY. 60 capsule 11   ELIQUIS 2.5 MG TABS tablet TAKE 1 TABLET BY MOUTH TWICE DAILY. 79 tablet 11   ferrous sulfate (FEROSUL) 325 (65 FE) MG tablet Take 1 tablet (325 mg total) by mouth daily with breakfast. 30 tablet 11   FLUAD QUADRIVALENT 0.5 ML injection      GLOBAL EASE INJECT PEN NEEDLES 31G X 5 MM MISC Inject 1 Syringe into the skin 4 (four) times daily.     insulin aspart (NOVOLOG FLEXPEN) 100 UNIT/ML FlexPen Inject per sliding scale 3x daily with meals: 151-200 2U, 201-250 4U, 251- 300 6U, 301-350 8U, 351-400 10U, <60/>400 call MD 15 mL 11   insulin detemir (LEVEMIR FLEXTOUCH) 100 UNIT/ML FlexPen 6U QAM and 4U QHS 15 mL 11   Lancet Devices (ONETOUCH DELICA PLUS LANCING) MISC      Lancets (ONETOUCH DELICA PLUS [id number]) MISC USE TO TEST BLOOD SUGAR UP TO FOUR TIMES DAILY (Patient taking differently: USE TO TEST BLOOD SUGAR 3 TIMES DAILY) 100 each 11   metoprolol tartrate (LOPRESSOR) 25 MG tablet TAKE 1 TABLET BY MOUTH TWICE DAILY. 79 tablet 11   mirtazapine (REMERON) 15 MG tablet TAKE 1/2 TABLET BY MOUTH AT BEDTIME. 20 tablet 11   MODERNA COVID-19 BIVAL BOOSTER 50 MCG/0.5ML injection      NAMZARIC 28-10 MG CP24 TAKE 1 CAPSULE BY MOUTH AT BEDTIME. 40 capsule 11   ONETOUCH ULTRA test strip USE TO TEST BLOOD SUGAR UP TO FOUR TIMES DAILY 100 strip 11   OYSTER SHELL CALCIUM PLUS D 500-5 MG-MCG TABS Take 1 tablet by mouth 2 (two) times daily.     rosuvastatin (CRESTOR) 20 MG tablet TAKE 1 TABLET BY MOUTH AT  BEDTIME. 40 tablet 11   traZODone (DESYREL) 100 MG tablet Take 100 mg by mouth at bedtime.     No current facility-administered medications for this visit.    REVIEW OF SYSTEMS:   Review of Systems  Constitutional:  Negative for appetite change, fatigue and unexpected weight change.  HENT:   Negative for nosebleeds.   Respiratory:  Negative for hemoptysis.   Gastrointestinal:  Negative for blood in stool.  Genitourinary:  Negative for hematuria.   Musculoskeletal:  Positive for gait problem (limp).  Neurological:  Positive for gait problem (limp).  All other systems reviewed and are negative.   PHYSICAL EXAMINATION: ECOG PERFORMANCE STATUS: 2 - Symptomatic, <50% confined to bed  There were no vitals filed for this visit. There were no vitals filed for this visit. Physical Exam Vitals reviewed.  Constitutional:      Appearance: Normal appearance.  Cardiovascular:     Rate and Rhythm: Normal rate and regular rhythm.     Pulses: Normal pulses.     Heart sounds: Normal heart sounds.  Pulmonary:     Effort: Pulmonary effort is normal.     Breath sounds: Normal breath sounds.  Abdominal:     Palpations: Abdomen is soft. There is no hepatomegaly, splenomegaly or mass.     Tenderness: There is no abdominal tenderness.  Musculoskeletal:     Right lower leg: No edema.     Left lower leg: No edema.  Lymphadenopathy:     Cervical: No cervical adenopathy.     Right cervical: No superficial cervical adenopathy.    Left cervical: No superficial cervical adenopathy.     Upper Body:     Right upper body: No supraclavicular, axillary or pectoral adenopathy.     Left upper body: No supraclavicular, axillary or pectoral adenopathy.  Neurological:     General: No focal deficit present.     Mental Status: She is alert and oriented to person, place, and time.  Psychiatric:        Mood and Affect: Mood normal.        Behavior: Behavior normal.     LABORATORY DATA:  I have reviewed  the data as listed Recent Results (from the past 2160 hour(s))  CBC with Differential/Platelet     Status: Abnormal   Collection Time: 10/01/21  9:21 AM  Result Value Ref Range   WBC 8.8 3.4 - 10.8 x10E3/uL   RBC 4.07 3.77 - 5.28 x10E6/uL   Hemoglobin 10.8 (L) 11.1 - 15.9 g/dL   Hematocrit 33.5 (L) 34.0 - 46.6 %   MCV 82 79 - 97 fL   MCH 26.5 (L) 26.6 - 33.0 pg   MCHC 32.2 31.5 - 35.7 g/dL   RDW 13.0 11.7 - 15.4 %   Platelets 188 150 - 450 x10E3/uL   Neutrophils 60 Not Estab. %   Lymphs 30 Not Estab. %   Monocytes 9 Not Estab. %   Eos 1 Not Estab. %   Basos 0 Not Estab. %   Neutrophils Absolute 5.2 1.4 - 7.0 x10E3/uL   Lymphocytes Absolute 2.6 0.7 - 3.1 x10E3/uL   Monocytes Absolute 0.8 0.1 - 0.9 x10E3/uL   EOS (ABSOLUTE) 0.1 0.0 - 0.4 x10E3/uL   Basophils Absolute 0.0 0.0 - 0.2 x10E3/uL   Immature Granulocytes 0 Not Estab. %   Immature Grans (Abs) 0.0 0.0 - 0.1 x10E3/uL  CMP14+EGFR     Status: Abnormal   Collection Time: 10/01/21  9:21 AM  Result Value Ref Range   Glucose 95 70 - 99 mg/dL   BUN 22 8 - 27 mg/dL   Creatinine, Ser 1.33 (H) 0.57 - 1.00 mg/dL   eGFR 38 (L) >59 mL/min/1.73   BUN/Creatinine Ratio 17 12 - 28   Sodium 142 134 - 144 mmol/L   Potassium 4.1 3.5 - 5.2 mmol/L   Chloride 105 96 - 106 mmol/L   CO2 27 20 - 29 mmol/L   Calcium 9.2 8.7 - 10.3 mg/dL   Total Protein 6.4 6.0 - 8.5 g/dL   Albumin 3.7  3.6 - 4.6 g/dL   Globulin, Total 2.7 1.5 - 4.5 g/dL   Albumin/Globulin Ratio 1.4 1.2 - 2.2   Bilirubin Total 0.2 0.0 - 1.2 mg/dL   Alkaline Phosphatase 92 44 - 121 IU/L   AST 25 0 - 40 IU/L   ALT 26 0 - 32 IU/L  Lipid Panel With LDL/HDL Ratio     Status: None   Collection Time: 10/01/21  9:21 AM  Result Value Ref Range   Cholesterol, Total 160 100 - 199 mg/dL   Triglycerides 119 0 - 149 mg/dL   HDL 52 >39 mg/dL   VLDL Cholesterol Cal 21 5 - 40 mg/dL   LDL Chol Calc (NIH) 87 0 - 99 mg/dL   LDL/HDL Ratio 1.7 0.0 - 3.2 ratio    Comment:                                      LDL/HDL Ratio                                             Men  Women                               1/2 Avg.Risk  1.0    1.5                                   Avg.Risk  3.6    3.2                                2X Avg.Risk  6.2    5.0                                3X Avg.Risk  8.0    6.1   Hemoglobin A1c     Status: Abnormal   Collection Time: 10/01/21  9:21 AM  Result Value Ref Range   Hgb A1c MFr Bld 7.1 (H) 4.8 - 5.6 %    Comment:          Prediabetes: 5.7 - 6.4          Diabetes: >6.4          Glycemic control for adults with diabetes: <7.0    Est. average glucose Bld gHb Est-mCnc 157 mg/dL  Iron, TIBC and Ferritin Panel     Status: Abnormal   Collection Time: 10/01/21  9:21 AM  Result Value Ref Range   Total Iron Binding Capacity 197 (L) 250 - 450 ug/dL   UIBC 155 118 - 369 ug/dL   Iron 42 27 - 139 ug/dL   Iron Saturation 21 15 - 55 %   Ferritin 1,066 (H) 15 - 150 ng/mL  Fe+TIBC+Fer     Status: Abnormal   Collection Time: 11/04/21  8:25 AM  Result Value Ref Range   Total Iron Binding Capacity 214 (L) 250 - 450 ug/dL   UIBC 116 (L) 118 - 369 ug/dL   Iron 98 27 - 139 ug/dL   Iron Saturation 46 15 - 55 %  Ferritin 1,036 (H) 15 - 150 ng/mL  CMP14+EGFR     Status: Abnormal   Collection Time: 11/08/21 10:51 AM  Result Value Ref Range   Glucose 384 (H) 70 - 99 mg/dL   BUN 36 (H) 8 - 27 mg/dL   Creatinine, Ser 1.70 (H) 0.57 - 1.00 mg/dL   eGFR 28 (L) >59 mL/min/1.73   BUN/Creatinine Ratio 21 12 - 28   Sodium 142 134 - 144 mmol/L   Potassium 4.2 3.5 - 5.2 mmol/L   Chloride 102 96 - 106 mmol/L   CO2 26 20 - 29 mmol/L   Calcium 9.0 8.7 - 10.3 mg/dL   Total Protein 6.1 6.0 - 8.5 g/dL   Albumin 3.7 3.6 - 4.6 g/dL   Globulin, Total 2.4 1.5 - 4.5 g/dL   Albumin/Globulin Ratio 1.5 1.2 - 2.2   Bilirubin Total <0.2 0.0 - 1.2 mg/dL   Alkaline Phosphatase 82 44 - 121 IU/L   AST 14 0 - 40 IU/L   ALT 13 0 - 32 IU/L    RADIOGRAPHIC STUDIES: I have personally  reviewed the radiological images as listed and agreed with the findings in the report. No results found.  ASSESSMENT:  Normocytic anemia: - Patient seen for evaluation of normocytic anemia and high ferritin levels. - CBC on 10/01/2021 with hemoglobin 10.8, MCV 82.  Ferritin was 1066. - Repeat ferritin on 11/04/2021 was 1036 with percent saturation 46. - She has CKD since 2013. - Denies any bleeding per rectum or melena.  Never had a colonoscopy. - She is on Eliquis 2.5 mg twice daily.   Social/family history: - She has dementia.  She also had left hip fracture in 2019 and has a limp.  She uses a walker.  She lives at [facility] for the last 1 and half year.  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  Non-smoker. - No family history of anemia or hemochromatosis. - Sister had breast cancer in her 69s.   PLAN:  Normocytic anemia: - We will repeat her CBC today and check for various nutritional deficiencies and hemolysis. - Likely etiology is mild anemia from CKD.  Her last hemoglobin is 10.8, which is close to normal for her age. - She will come back in 2 to 3 weeks to discuss results.  2.  Elevated ferritin: - She has elevated ferritin levels of above 1000 in December 2022 and February of this year.  Percent saturation is within normal range. - She is on iron tablet 325 mg daily. - Most likely from oral iron intake and possible fatty liver. - As her ferritin was very high, we will also check hemochromatosis panel.   All questions were answered. The patient knows to call the clinic with any problems, questions or concerns.  Derek Jack, MD 11/19/21 1:22 PM  [place] [phone]   I, Thana Ates, am acting as a scribe for Dr. Derek Jack.  I, Derek Jack MD, have reviewed the above documentation for accuracy and completeness, and I agree with the above.

## 2021-11-19 NOTE — Progress Notes (Signed)
Patient's daughter and aide answered questions due to patient being confused.

## 2021-11-19 NOTE — Patient Instructions (Addendum)
[place] at [facility] Discharge Instructions  You were seen and examined today by Dr. Delton Coombes. Dr. Delton Coombes is a hematologist, meaning that he specializes in blood abnormalities. Dr. Delton Coombes discussed your past medical history, family history of cancers/blood conditions and the events that led to you being here today.  You were referred to Dr. Delton Coombes due to anemia. Your hemoglobin was low yet your ferritin (iron storage) was high. Dr. Delton Coombes has recommended additional lab work today in order to identify the cause.  Stop the iron pill.  Follow-up as scheduled.   Thank you for choosing [place] at [facility] to provide your oncology and hematology care.  To afford each patient quality time with our provider, please arrive at least 15 minutes before your scheduled appointment time.   If you have a lab appointment with the [facility] please come in thru the Main Entrance and check in at the main information desk.  You need to re-schedule your appointment should you arrive 10 or more minutes late.  We strive to give you quality time with our providers, and arriving late affects you and other patients whose appointments are after yours.  Also, if you no show three or more times for appointments you may be dismissed from the clinic at the providers discretion.     Again, thank you for choosing [facility].  Our hope is that these requests will decrease the amount of time that you wait before being seen by our physicians.       _____________________________________________________________  Should you have questions after your visit to [facility], please contact our office at [phone] and follow the prompts.  Our office hours are 8:00 a.m. and 4:30 p.m. Monday - Friday.  Please note that voicemails left after 4:00 p.m. may not be returned until the following business day.  We are closed weekends and  major holidays.  You do have access to a nurse 24-7, just call the main number to the clinic [phone] and do not press any options, hold on the line and a nurse will answer the phone.    For prescription refill requests, have your pharmacy contact our office and allow 72 hours.    Due to Covid, you will need to wear a mask upon entering the hospital. If you do not have a mask, a mask will be given to you at the Main Entrance upon arrival. For doctor visits, patients may have 1 support person age 6 or older with them. For treatment visits, patients can not have anyone with them due to social distancing guidelines and our immunocompromised population.

## 2021-11-20 LAB — HAPTOGLOBIN: Haptoglobin: 156 mg/dL (ref 41–333)

## 2021-11-21 LAB — COPPER, SERUM: Copper: 141 ug/dL (ref 80–158)

## 2021-11-22 LAB — KAPPA/LAMBDA LIGHT CHAINS
Kappa free light chain: 54.7 mg/L — ABNORMAL HIGH (ref 3.3–19.4)
Kappa, lambda light chain ratio: 2.3 — ABNORMAL HIGH (ref 0.26–1.65)
Lambda free light chains: 23.8 mg/L (ref 5.7–26.3)

## 2021-11-22 LAB — IMMUNOFIXATION ELECTROPHORESIS
IgA: 237 mg/dL (ref 64–422)
IgG (Immunoglobin G), Serum: 1159 mg/dL (ref 586–1602)
IgM (Immunoglobulin M), Srm: 33 mg/dL (ref 26–217)
Total Protein ELP: 6.2 g/dL (ref 6.0–8.5)

## 2021-11-22 LAB — PROTEIN ELECTROPHORESIS, SERUM
A/G Ratio: 1.1 (ref 0.7–1.7)
Albumin ELP: 3.4 g/dL (ref 2.9–4.4)
Alpha-1-Globulin: 0.2 g/dL (ref 0.0–0.4)
Alpha-2-Globulin: 0.8 g/dL (ref 0.4–1.0)
Beta Globulin: 0.9 g/dL (ref 0.7–1.3)
Gamma Globulin: 1.1 g/dL (ref 0.4–1.8)
Globulin, Total: 3 g/dL (ref 2.2–3.9)
Total Protein ELP: 6.4 g/dL (ref 6.0–8.5)

## 2021-11-24 DIAGNOSIS — N179 Acute kidney failure, unspecified: Secondary | ICD-10-CM | POA: Diagnosis not present

## 2021-11-25 ENCOUNTER — Telehealth: Payer: Self-pay

## 2021-11-25 DIAGNOSIS — E785 Hyperlipidemia, unspecified: Secondary | ICD-10-CM | POA: Diagnosis not present

## 2021-11-25 DIAGNOSIS — I10 Essential (primary) hypertension: Secondary | ICD-10-CM | POA: Diagnosis not present

## 2021-11-25 DIAGNOSIS — R69 Illness, unspecified: Secondary | ICD-10-CM | POA: Diagnosis not present

## 2021-11-25 DIAGNOSIS — E119 Type 2 diabetes mellitus without complications: Secondary | ICD-10-CM | POA: Diagnosis not present

## 2021-11-25 LAB — CMP14+EGFR
ALT: 15 IU/L (ref 0–32)
AST: 15 IU/L (ref 0–40)
Albumin/Globulin Ratio: 1.6 (ref 1.2–2.2)
Albumin: 3.5 g/dL — ABNORMAL LOW (ref 3.6–4.6)
Alkaline Phosphatase: 77 IU/L (ref 44–121)
BUN/Creatinine Ratio: 18 (ref 12–28)
BUN: 25 mg/dL (ref 8–27)
Bilirubin Total: 0.2 mg/dL (ref 0.0–1.2)
CO2: 26 mmol/L (ref 20–29)
Calcium: 9 mg/dL (ref 8.7–10.3)
Chloride: 108 mmol/L — ABNORMAL HIGH (ref 96–106)
Creatinine, Ser: 1.36 mg/dL — ABNORMAL HIGH (ref 0.57–1.00)
Globulin, Total: 2.2 g/dL (ref 1.5–4.5)
Glucose: 97 mg/dL (ref 70–99)
Potassium: 4.5 mmol/L (ref 3.5–5.2)
Sodium: 146 mmol/L — ABNORMAL HIGH (ref 134–144)
Total Protein: 5.7 g/dL — ABNORMAL LOW (ref 6.0–8.5)
eGFR: 37 mL/min/{1.73_m2} — ABNORMAL LOW (ref >59)

## 2021-11-25 LAB — METHYLMALONIC ACID, SERUM: Methylmalonic Acid, Quantitative: 181 nmol/L (ref 0–378)

## 2021-11-25 LAB — HEMOCHROMATOSIS DNA-PCR(C282Y,H63D)

## 2021-11-25 NOTE — Telephone Encounter (Signed)
Called pt's son back went over results

## 2021-11-25 NOTE — Telephone Encounter (Signed)
Patient son  returning call.

## 2021-11-25 NOTE — Progress Notes (Signed)
Please review results with patient's son. Kidney function is back to baseline, sodium level is slightly elevated patient should drink enough water to stay hydrated. Thanks

## 2021-11-30 ENCOUNTER — Telehealth (HOSPITAL_COMMUNITY): Payer: Self-pay | Admitting: *Deleted

## 2021-11-30 ENCOUNTER — Other Ambulatory Visit: Payer: Self-pay

## 2021-11-30 ENCOUNTER — Ambulatory Visit (INDEPENDENT_AMBULATORY_CARE_PROVIDER_SITE_OTHER): Payer: Medicare HMO | Admitting: Nurse Practitioner

## 2021-11-30 ENCOUNTER — Ambulatory Visit: Payer: Medicare HMO

## 2021-11-30 ENCOUNTER — Encounter: Payer: Self-pay | Admitting: Nurse Practitioner

## 2021-11-30 VITALS — BP 170/80 | HR 52 | Wt 139.0 lb

## 2021-11-30 DIAGNOSIS — N1832 Chronic kidney disease, stage 3b: Secondary | ICD-10-CM

## 2021-11-30 DIAGNOSIS — I10 Essential (primary) hypertension: Secondary | ICD-10-CM | POA: Diagnosis not present

## 2021-11-30 MED ORDER — HYDRALAZINE HCL 25 MG PO TABS: 25.0000 mg | ORAL_TABLET | Freq: Two times a day (BID) | ORAL | 3 refills | Status: DC

## 2021-11-30 MED ORDER — LOSARTAN POTASSIUM 25 MG PO TABS: 25.0000 mg | ORAL_TABLET | Freq: Every day | ORAL | 1 refills | Status: DC

## 2021-11-30 NOTE — Progress Notes (Signed)
° °  Betty [name]     MRN: [id number]      DOB: September 10, 1931   HPI [name] with medical history of CKD stage III, dementia, hyperlipidemia, type 2 diabetes is here for follow up hypertension and lab review. Patient is accompanied to today's visit by her adult children and the caregiver   Patient takes metoprolol 25 mg twice daily for hypertension.,   Patient family stated that patient is still taking ferrous  sulfate, they had a visit to hematology due to elevated ferritin levels, they wanted to know if the patient should stop taking ferrous sulfate or to take meds 3 times a week.  After visit summary from hematologist states that patient should stop taking med.  Patient family updated with this information they verbalized understanding.    ROS Denies recent fever or chills. Denies sinus pressure, nasal congestion, ear pain or sore throat. Denies chest congestion, productive cough or wheezing. Denies chest pains, palpitations and leg swelling Denies abdominal pain, nausea, vomiting,diarrhea or constipation.   Denies dysuria, frequency, hesitancy or incontinence. Denies joint pain, swelling and limitation in mobility. Denies depression, anxiety or insomnia PE  BP (!) 170/80    Pulse (!) 52    Wt 139 lb (63 kg)    SpO2 97%    BMI 26.26 kg/m   Patient alert and level of orientation is at baseline and in no cardiopulmonary distress.  Chest: Clear to auscultation bilaterally.  CVS: S1, S2 no murmurs, no S3.Regular rate.  ABD: Soft non tender.   Ext: No edema  MS: Adequate ROM spine, shoulders, hips and knees.  Psych: Good eye contact, normal affect.  not anxious or depressed appearing.    Assessment & Plan  Essential hypertension, benign BP Readings from Last 3 Encounters:  11/30/21 (!) 170/82  11/08/21 (!) 140/58  10/06/21 (!) 178/84  Uncontrolled condition Takes metoprolol 25 mg twice daily. Hydrochlorothiazide was stopped at last visit due to AKI. Blood  pressure still elevated today. Start losartan 25 mg daily, Hydralazine 25 mg twice daily. Monitor blood pressure 2 times daily 3 times a week and bring log to next visit. Dash diet advised, exercise as tolerated advised. CMP in 2 weeks. Follow-up for blood pressure check in 3 weeks.   CKD (chronic kidney disease), stage III [facility]) Lab Results  Component Value Date   NA 146 (H) 11/24/2021   K 4.5 11/24/2021   CO2 26 11/24/2021   GLUCOSE 97 11/24/2021   BUN 25 11/24/2021   CREATININE 1.36 (H) 11/24/2021   CALCIUM 9.0 11/24/2021   EGFR 37 (L) 11/24/2021   GFRNONAA 26 (L) 06/27/2020  Kidney function back to baseline Patient family refused referral to nephrology. Avoid nephro toxic agents. Start losartan 25 mg daily Drink 64 ounces of water daily CMP in 2 weeks

## 2021-11-30 NOTE — Assessment & Plan Note (Signed)
BP Readings from Last 3 Encounters:  11/30/21 (!) 170/82  11/08/21 (!) 140/58  10/06/21 (!) 178/84  Uncontrolled condition Takes metoprolol 25 mg twice daily. Hydrochlorothiazide was stopped at last visit due to AKI. Blood pressure still elevated today. Start losartan 25 mg daily, Hydralazine 25 mg twice daily. Monitor blood pressure 2 times daily 3 times a week and bring log to next visit. Dash diet advised, exercise as tolerated advised. CMP in 2 weeks. Follow-up for blood pressure check in 3 weeks.

## 2021-11-30 NOTE — Patient Instructions (Addendum)
Please take losartan 25 mg daily for blood pressure  Take hydralazine 25 mg 2 times daily for blood pressure( one tablet in the morning and one tablet in the evening).  Continue metoprolol 25 mg 2 times daily  Please stop taking ferrous sulfate.  Please get labs done in 2 weeks as discussed  Please check blood pressure twice daily at least 3 times a week and bring blood pressure readings to your next appointment.   It is important that you exercise regularly at least 30 minutes 5 times a week as tolerated  Think about what you will eat, plan ahead. Choose " clean, green, fresh or frozen" over canned, processed or packaged foods which are more sugary, salty and fatty. 70 to 75% of food eaten should be vegetables and fruit. Three meals at set times with snacks allowed between meals, but they must be fruit or vegetables. Aim to eat over a 12 hour period , example 7 am to 7 pm, and STOP after  your last meal of the day. Drink water,generally about 64 ounces per day, no other drink is as healthy. Fruit juice is best enjoyed in a healthy way, by EATING the fruit.  Thanks for choosing [facility], we consider it a privelige to serve you.

## 2021-11-30 NOTE — Telephone Encounter (Signed)
Received a message from daughter Lynelle Smoke asking if she still needed to take iron suppliment, as they were not sure from the last visit what the instructions were.  Per Dr. Delton Coombes, she may stop for now.  Message left for son, Yvone Neu, who is her POA to stop medication and call if there were any further questions or concerns.

## 2021-11-30 NOTE — Assessment & Plan Note (Signed)
Lab Results  Component Value Date   NA 146 (H) 11/24/2021   K 4.5 11/24/2021   CO2 26 11/24/2021   GLUCOSE 97 11/24/2021   BUN 25 11/24/2021   CREATININE 1.36 (H) 11/24/2021   CALCIUM 9.0 11/24/2021   EGFR 37 (L) 11/24/2021   GFRNONAA 26 (L) 06/27/2020  Kidney function back to baseline Patient family refused referral to nephrology. Avoid nephro toxic agents. Start losartan 25 mg daily Drink 64 ounces of water daily CMP in 2 weeks

## 2021-12-12 NOTE — Progress Notes (Signed)
Betty [name], [place] [id number]   CLINIC:  Medical Oncology/Hematology  PCP:  Renee Rival, FNP [address] [place] [address] [place] [address] [phone]   REASON FOR VISIT:  Follow-up for normocytic anemia  PRIOR THERAPY: Daily iron tablet  CURRENT THERAPY: Under work-up  INTERVAL HISTORY:  Betty [name] 88 y.o. female returns for routine follow-up of normocytic anemia and elevated ferritin.  She was seen for initial consultation by Dr. Delton Coombes on 11/19/2021.  Patient is accompanied today by her daughter Betty [name], who assists in history since patient is poor historian due to underlying dementia.  At today's visit, she reports feeling fairly well.  No recent hospitalizations, surgeries, or changes in baseline health status.  Iron tablet was stopped after her last visit. She has not had any obvious melena or hematochezia. Her energy level is at baseline. She has not complained of any chest pain, dyspnea on exertion, lightheadedness, or syncope.  She has 75% energy and 100% appetite. She endorses that she is maintaining a stable weight.   REVIEW OF SYSTEMS:  Review of Systems  Unable to perform ROS: Dementia     PAST MEDICAL/SURGICAL HISTORY:  Past Medical History:  Diagnosis Date   Alzheimer disease ([place])    Chronic kidney disease    stage II   Closed intertrochanteric fracture of femur ([place]) 03/27/2018   Dementia ([place])    Diabetes mellitus    Grade I diastolic dysfunction 36/64/4034   Hyperlipidemia    Hypertension    Past Surgical History:  Procedure Laterality Date   ABDOMINAL HYSTERECTOMY     BACK SURGERY     INTRAMEDULLARY (IM) NAIL INTERTROCHANTERIC Left 03/28/2018   Procedure: INTRAMEDULLARY (IM) NAIL INTERTROCHANTRIC;  Surgeon: Carole Civil, MD;  Location: [facility];  Service: Orthopedics;  Laterality: Left;     SOCIAL HISTORY:  Social History   Socioeconomic History   Marital status:  Widowed    Spouse name: Not on file   Number of children: 4   Years of education: Not on file   Highest education level: Not on file  Occupational History   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   Social Determinants of Health   Financial Resource Strain: Low Risk    Difficulty of Paying Living Expenses: Not hard at all  Food Insecurity: No Food Insecurity   Worried About Charity fundraiser in the Last Year: Never true   [place] in the Last Year: Never true  Transportation Needs: No Transportation Needs   Lack of Transportation (Medical): No   Lack of Transportation (Non-Medical): No  Physical Activity: Sufficiently Active   Days of Exercise per Week: 5 days   Minutes of Exercise per Session: 30 min  Stress: Not on file  Social Connections: Socially Isolated   Frequency of Communication with Friends and Family: Never   Frequency of Social Gatherings with Friends and Family: Twice a week   Attends Religious Services: Never  Active Member of Clubs or Organizations: No   Attends Archivist Meetings: Never   Marital Status: Widowed  Human resources officer Violence: Not on file    FAMILY HISTORY:  Family History  Problem Relation Age of Onset   Hypertension Mother    Cancer Father        colon    Cancer Sister        Breast cancer   Osteoporosis Sister     CURRENT MEDICATIONS:  Outpatient Encounter Medications as of 12/13/2021  Medication Sig Note   Alcohol Swabs (ALCOHOL PREP) PADS     BD PEN NEEDLE NANO U/F 32G X 4 MM MISC USE AS DIRECTED  TO INJECT NOVOLOG 3 TIMES A DAY AND LANTUS AT BEDTIME.    Blood Glucose Monitoring Suppl (BLOOD GLUCOSE SYSTEM PAK) KIT Use as instructed for 3x daily testing. Dx: E11.22.    busPIRone (BUSPAR) 5 MG tablet TAKE 1 TABLET BY MOUTH TWICE DAILY.    calcium-vitamin D (OSCAL WITH D) 500-200 MG-UNIT tablet TAKE 1 TABLET BY MOUTH TWICE DAILY.    docusate sodium (COLACE) 100 MG capsule TAKE 1 CAPSULE BY MOUTH TWICE DAILY.    ELIQUIS 2.5 MG TABS tablet TAKE 1 TABLET BY MOUTH TWICE DAILY.    FLUAD QUADRIVALENT 0.5 ML injection     GLOBAL EASE INJECT PEN NEEDLES 31G X 5 MM MISC Inject 1 Syringe into the skin 4 (four) times daily.    hydrALAZINE (APRESOLINE) 25 MG tablet Take 1 tablet (25 mg total) by mouth 2 (two) times daily.    insulin aspart (NOVOLOG FLEXPEN) 100 UNIT/ML FlexPen Inject per sliding scale 3x daily with meals: 151-200 2U, 201-250 4U, 251- 300 6U, 301-350 8U, 351-400 10U, <60/>400 call MD    insulin detemir (LEVEMIR FLEXTOUCH) 100 UNIT/ML FlexPen 6U QAM and 4U QHS    Lancet Devices (ONETOUCH DELICA PLUS LANCING) MISC     Lancets (ONETOUCH DELICA PLUS [id number]) MISC USE TO TEST BLOOD SUGAR UP TO FOUR TIMES DAILY (Patient taking differently: USE TO TEST BLOOD SUGAR 3 TIMES DAILY) 11/30/2021: 2 times daily   losartan (COZAAR) 25 MG tablet Take 1 tablet (25 mg total) by mouth daily.    metoprolol tartrate (LOPRESSOR) 25 MG tablet TAKE 1 TABLET BY MOUTH TWICE DAILY.    mirtazapine (REMERON) 15 MG tablet TAKE 1/2 TABLET BY MOUTH AT BEDTIME.    MODERNA COVID-19 BIVAL BOOSTER 50 MCG/0.5ML injection     NAMZARIC 28-10 MG CP24 TAKE 1 CAPSULE BY MOUTH AT BEDTIME.    ONETOUCH ULTRA test strip USE TO TEST BLOOD SUGAR UP TO FOUR TIMES DAILY    OYSTER SHELL CALCIUM PLUS D 500-5 MG-MCG TABS Take 1 tablet by mouth 2 (two) times daily.    rosuvastatin (CRESTOR) 20 MG tablet TAKE 1 TABLET BY MOUTH AT BEDTIME.    traZODone (DESYREL) 100 MG tablet Take 100 mg by mouth at bedtime.    No facility-administered  encounter medications on file as of 12/13/2021.    ALLERGIES:  Allergies  Allergen Reactions   Thiazide-Type Diuretics Other (See Comments)    AKI     PHYSICAL EXAM:  ECOG PERFORMANCE STATUS: 3 - Symptomatic, >50% confined to bed  There were no vitals filed for this visit. There were no vitals filed for this visit. Physical Exam Constitutional:      Appearance: Normal appearance. She is obese.  HENT:     Head: Normocephalic and atraumatic.     Mouth/Throat:     Mouth: Mucous membranes  are moist.  Eyes:     Extraocular Movements: Extraocular movements intact.     Pupils: Pupils are equal, round, and reactive to light.  Cardiovascular:     Rate and Rhythm: Normal rate and regular rhythm.     Pulses: Normal pulses.     Heart sounds: Normal heart sounds.  Pulmonary:     Effort: Pulmonary effort is normal.     Breath sounds: Normal breath sounds.  Abdominal:     General: Bowel sounds are normal.     Palpations: Abdomen is soft.     Tenderness: There is no abdominal tenderness.  Musculoskeletal:        General: No swelling.     Right lower leg: No edema.     Left lower leg: No edema.  Lymphadenopathy:     Cervical: No cervical adenopathy.  Skin:    General: Skin is warm and dry.  Neurological:     General: No focal deficit present.     Mental Status: She is alert. Mental status is at baseline.     Comments: Oriented only to self  Psychiatric:        Mood and Affect: Mood normal.        Behavior: Behavior normal.     LABORATORY DATA:  I have reviewed the labs as listed.  CBC    Component Value Date/Time   WBC 7.4 11/19/2021 1246   RBC 3.77 (L) 11/19/2021 1246   RBC 3.83 (L) 11/19/2021 1246   HGB 10.1 (L) 11/19/2021 1246   HGB 10.8 (L) 10/01/2021 0921   HCT 33.0 (L) 11/19/2021 1246   HCT 33.5 (L) 10/01/2021 0921   PLT 186 11/19/2021 1246   PLT 188 10/01/2021 0921   MCV 86.2 11/19/2021 1246   MCV 82 10/01/2021 0921   MCH 26.4 11/19/2021 1246   MCHC 30.6  11/19/2021 1246   RDW 13.9 11/19/2021 1246   RDW 13.0 10/01/2021 0921   LYMPHSABS 1.8 11/19/2021 1246   LYMPHSABS 2.6 10/01/2021 0921   MONOABS 0.7 11/19/2021 1246   EOSABS 0.0 11/19/2021 1246   EOSABS 0.1 10/01/2021 0921   BASOSABS 0.0 11/19/2021 1246   BASOSABS 0.0 10/01/2021 0921   CMP Latest Ref Rng & Units 11/24/2021 11/08/2021 10/01/2021  Glucose 70 - 99 mg/dL 97 384(H) 95  BUN 8 - 27 mg/dL 25 36(H) 22  Creatinine 0.57 - 1.00 mg/dL 1.36(H) 1.70(H) 1.33(H)  Sodium 134 - 144 mmol/L 146(H) 142 142  Potassium 3.5 - 5.2 mmol/L 4.5 4.2 4.1  Chloride 96 - 106 mmol/L 108(H) 102 105  CO2 20 - 29 mmol/L _0 Calcium 8.7 - 10.3 mg/dL 9.0 9.0 9.2  Total Protein 6.0 - 8.5 g/dL 5.7(L) 6.1 6.4  Total Bilirubin 0.0 - 1.2 mg/dL 0.2 <0.2 0.2  Alkaline Phos 44 - 121 IU/L 77 82 92  AST 0 - 40 IU/L _1 ALT 0 - 32 IU/L _2 DIAGNOSTIC IMAGING:  I have independently reviewed the relevant imaging and discussed with the patient.  ASSESSMENT & PLAN: 1.  Normocytic anemia - Patient seen for evaluation of normocytic anemia and high ferritin levels. - She has CKD since 2013.  Most recent CMP shows CKD stage IIIb with creatinine 1.36/GFR 37. - She has never had a colonoscopy. - Denies any bleeding per rectum or melena.   - She is on Eliquis 2.5 mg twice daily.  She has been ferrous sulfate 325 mg daily. - CBC on  10/01/2021 with hemoglobin 10.8, MCV 82.  Ferritin was 1066. - Repeat ferritin on 11/04/2021 was 1036 with percent saturation 46. - Hematology work-up (11/19/2021) was unremarkable.  CBC with Hgb 10.1/MCV 86.2.  Normal reticulocytes, LDH, haptoglobin.  Normal B12, methylmalonic acid, folate, copper.  SPEP/immunofixation negative, slightly elevated free light chains in keeping with CKD. - Differential diagnosis favors mild anemia from CKD.  Unable to exclude MDS although further work-up such as bone marrow biopsy is thought unlikely to change the plan at this time. - PLAN: No  indication for treatment at this time. - Repeat CBC and iron panel with RTC in 3 months.  2.  Elevated ferritin - She has elevated ferritin levels of above 1000 in December 2022 and February of this year.  Percent saturation is within normal range. - CBC on 10/01/2021 with hemoglobin 10.8, MCV 82.  Ferritin was 1066. - Repeat ferritin on 11/04/2021 was 1036 with percent saturation 46. - She has been taking iron tablet 325 mg daily. - Hemochromatosis DNA (11/19/2021) was negative - Most likely from oral iron intake and possible fatty liver disease - PLAN: Patient instructed to stop taking oral iron tablet. - Repeat iron panel and RTC in 3 months.  3.  Elevated blood pressure - Blood pressure today 199/71 - Per patient's daughter, Betty [name], patient has "whitecoat syndrome" and always has high blood pressure at doctor's appointments - Patient's caregiver is given instructions to check patient's blood pressure 30 minutes after she returns to her residential facility and to call primary care provider for additional instructions if blood pressure remains greater than 160/90  4.  Other history - She has dementia.  She also had left hip fracture in 2019 and has a limp.  She uses a walker.  [facility] lives at [facility] for the last 1 and half year.  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  Non-smoker. - No family history of anemia or hemochromatosis. - Sister had breast cancer in her 53s.   PLAN SUMMARY & DISPOSITION: Labs in 3 months RTC after labs  All questions were answered. The patient knows to call the clinic with any problems, questions or concerns.  Medical decision making: Low  Time spent on visit: I spent 15 minutes counseling the patient face to face. The total time spent in the appointment was 25 minutes and more than 50% was on counseling.   Harriett Rush, PA-C  12/13/2021 1:35 PM

## 2021-12-13 ENCOUNTER — Inpatient Hospital Stay (HOSPITAL_COMMUNITY): Payer: Medicare HMO | Attending: Hematology | Admitting: Physician Assistant

## 2021-12-13 ENCOUNTER — Other Ambulatory Visit: Payer: Self-pay

## 2021-12-13 VITALS — BP 199/71 | HR 66 | Temp 96.9°F | Resp 17 | Ht 61.0 in

## 2021-12-13 DIAGNOSIS — Z7901 Long term (current) use of anticoagulants: Secondary | ICD-10-CM | POA: Insufficient documentation

## 2021-12-13 DIAGNOSIS — R269 Unspecified abnormalities of gait and mobility: Secondary | ICD-10-CM | POA: Insufficient documentation

## 2021-12-13 DIAGNOSIS — N1832 Chronic kidney disease, stage 3b: Secondary | ICD-10-CM | POA: Diagnosis not present

## 2021-12-13 DIAGNOSIS — E1151 Type 2 diabetes mellitus with diabetic peripheral angiopathy without gangrene: Secondary | ICD-10-CM | POA: Diagnosis not present

## 2021-12-13 DIAGNOSIS — F0280 Dementia in other diseases classified elsewhere without behavioral disturbance: Secondary | ICD-10-CM | POA: Insufficient documentation

## 2021-12-13 DIAGNOSIS — Z803 Family history of malignant neoplasm of breast: Secondary | ICD-10-CM | POA: Insufficient documentation

## 2021-12-13 DIAGNOSIS — R030 Elevated blood-pressure reading, without diagnosis of hypertension: Secondary | ICD-10-CM | POA: Diagnosis not present

## 2021-12-13 DIAGNOSIS — I10 Essential (primary) hypertension: Secondary | ICD-10-CM | POA: Diagnosis not present

## 2021-12-13 DIAGNOSIS — D631 Anemia in chronic kidney disease: Secondary | ICD-10-CM | POA: Diagnosis not present

## 2021-12-13 DIAGNOSIS — R69 Illness, unspecified: Secondary | ICD-10-CM | POA: Diagnosis not present

## 2021-12-13 DIAGNOSIS — B353 Tinea pedis: Secondary | ICD-10-CM | POA: Diagnosis not present

## 2021-12-13 DIAGNOSIS — R7989 Other specified abnormal findings of blood chemistry: Secondary | ICD-10-CM | POA: Diagnosis not present

## 2021-12-13 DIAGNOSIS — D649 Anemia, unspecified: Secondary | ICD-10-CM | POA: Insufficient documentation

## 2021-12-13 DIAGNOSIS — B351 Tinea unguium: Secondary | ICD-10-CM | POA: Diagnosis not present

## 2021-12-13 DIAGNOSIS — L081 Erythrasma: Secondary | ICD-10-CM | POA: Diagnosis not present

## 2021-12-13 NOTE — Patient Instructions (Signed)
[place] at [facility] ?Discharge Instructions ? ?You were seen today by Tarri Abernethy PA-C. ? ?Your HIGH IRON levels are most likely from being on the iron pill for so long.  You should STOP the iron pill, and we will recheck your levels in 3 months.   ? ?Your ANEMIA (low blood) is most likely related to your chronic kidney disease.  You do not need any treatment for it at this time, but we will continue to monitor. ? ?LABS: Return in 3 months for repeat labs  ? ?FOLLOW-UP APPOINTMENT: Office visit in 3 months, after lab ? ? ?Thank you for choosing [place] at [facility] to provide your oncology and hematology care.  To afford each patient quality time with our provider, please arrive at least 15 minutes before your scheduled appointment time.  ? ?If you have a lab appointment with the [facility] please come in thru the Main Entrance and check in at the main information desk. ? ?You need to re-schedule your appointment should you arrive 10 or more minutes late.  We strive to give you quality time with our providers, and arriving late affects you and other patients whose appointments are after yours.  Also, if you no show three or more times for appointments you may be dismissed from the clinic at the providers discretion.     ?Again, thank you for choosing [facility].  Our hope is that these requests will decrease the amount of time that you wait before being seen by our physicians.       ?_____________________________________________________________ ? ?Should you have questions after your visit to [facility], please contact our office at [phone] and follow the prompts.  Our office hours are 8:00 a.m. and 4:30 p.m. Monday - Friday.  Please note that voicemails left after 4:00 p.m. may not be returned until the following business day.  We are closed weekends and major holidays.  You do have access to a nurse 24-7, just  call the main number to the clinic [phone] and do not press any options, hold on the line and a nurse will answer the phone.   ? ?For prescription refill requests, have your pharmacy contact our office and allow 72 hours.   ? ?Due to Covid, you will need to wear a mask upon entering the hospital. If you do not have a mask, a mask will be given to you at the Main Entrance upon arrival. For doctor visits, patients may have 1 support person age 74 or older with them. For treatment visits, patients can not have anyone with them due to social distancing guidelines and our immunocompromised population.  ? ? ? ?

## 2021-12-14 LAB — BASIC METABOLIC PANEL
BUN/Creatinine Ratio: 19 (ref 12–28)
BUN: 26 mg/dL (ref 8–27)
CO2: 24 mmol/L (ref 20–29)
Calcium: 9.1 mg/dL (ref 8.7–10.3)
Chloride: 107 mmol/L — ABNORMAL HIGH (ref 96–106)
Creatinine, Ser: 1.38 mg/dL — ABNORMAL HIGH (ref 0.57–1.00)
Glucose: 131 mg/dL — ABNORMAL HIGH (ref 70–99)
Potassium: 4 mmol/L (ref 3.5–5.2)
Sodium: 144 mmol/L (ref 134–144)
eGFR: 37 mL/min/{1.73_m2} — ABNORMAL LOW (ref >59)

## 2021-12-20 DIAGNOSIS — E119 Type 2 diabetes mellitus without complications: Secondary | ICD-10-CM | POA: Diagnosis not present

## 2021-12-20 DIAGNOSIS — R69 Illness, unspecified: Secondary | ICD-10-CM | POA: Diagnosis not present

## 2021-12-20 DIAGNOSIS — E785 Hyperlipidemia, unspecified: Secondary | ICD-10-CM | POA: Diagnosis not present

## 2021-12-20 DIAGNOSIS — I10 Essential (primary) hypertension: Secondary | ICD-10-CM | POA: Diagnosis not present

## 2021-12-31 ENCOUNTER — Other Ambulatory Visit: Payer: Self-pay | Admitting: Family Medicine

## 2022-01-05 ENCOUNTER — Ambulatory Visit: Payer: Medicare HMO | Admitting: Nurse Practitioner

## 2022-01-20 DIAGNOSIS — R69 Illness, unspecified: Secondary | ICD-10-CM | POA: Diagnosis not present

## 2022-01-20 DIAGNOSIS — E119 Type 2 diabetes mellitus without complications: Secondary | ICD-10-CM | POA: Diagnosis not present

## 2022-01-20 DIAGNOSIS — I10 Essential (primary) hypertension: Secondary | ICD-10-CM | POA: Diagnosis not present

## 2022-01-20 DIAGNOSIS — E785 Hyperlipidemia, unspecified: Secondary | ICD-10-CM | POA: Diagnosis not present

## 2022-01-24 DIAGNOSIS — L081 Erythrasma: Secondary | ICD-10-CM | POA: Diagnosis not present

## 2022-02-04 ENCOUNTER — Ambulatory Visit: Payer: Medicare HMO | Admitting: Nurse Practitioner

## 2022-02-10 ENCOUNTER — Ambulatory Visit: Payer: Medicare HMO | Admitting: Nurse Practitioner

## 2022-02-24 DIAGNOSIS — F325 Major depressive disorder, single episode, in full remission: Secondary | ICD-10-CM | POA: Diagnosis not present

## 2022-02-24 DIAGNOSIS — F0390 Unspecified dementia without behavioral disturbance: Secondary | ICD-10-CM | POA: Diagnosis not present

## 2022-02-24 DIAGNOSIS — F411 Generalized anxiety disorder: Secondary | ICD-10-CM | POA: Diagnosis not present

## 2022-02-24 DIAGNOSIS — Z593 Problems related to living in residential institution: Secondary | ICD-10-CM | POA: Diagnosis not present

## 2022-02-24 DIAGNOSIS — R69 Illness, unspecified: Secondary | ICD-10-CM | POA: Diagnosis not present

## 2022-03-03 ENCOUNTER — Other Ambulatory Visit: Payer: Self-pay | Admitting: Family Medicine

## 2022-03-03 NOTE — Telephone Encounter (Signed)
Please advise prescribed by old provider

## 2022-03-04 ENCOUNTER — Other Ambulatory Visit: Payer: Self-pay | Admitting: Family Medicine

## 2022-03-04 NOTE — Telephone Encounter (Signed)
Called willette the caregiver she was on her other job will call back to schedule follow up within a month

## 2022-03-07 ENCOUNTER — Telehealth: Payer: Self-pay | Admitting: Nurse Practitioner

## 2022-03-07 NOTE — Telephone Encounter (Signed)
Message to provider not needed after speaking with care facility

## 2022-03-14 DIAGNOSIS — R197 Diarrhea, unspecified: Secondary | ICD-10-CM | POA: Diagnosis not present

## 2022-03-14 DIAGNOSIS — B349 Viral infection, unspecified: Secondary | ICD-10-CM | POA: Diagnosis not present

## 2022-03-14 DIAGNOSIS — R69 Illness, unspecified: Secondary | ICD-10-CM | POA: Diagnosis not present

## 2022-03-15 ENCOUNTER — Telehealth: Payer: Self-pay | Admitting: Nurse Practitioner

## 2022-03-15 ENCOUNTER — Inpatient Hospital Stay (HOSPITAL_COMMUNITY): Payer: Medicare HMO | Attending: Hematology

## 2022-03-15 ENCOUNTER — Other Ambulatory Visit: Payer: Self-pay

## 2022-03-15 DIAGNOSIS — R7989 Other specified abnormal findings of blood chemistry: Secondary | ICD-10-CM

## 2022-03-15 DIAGNOSIS — N1832 Chronic kidney disease, stage 3b: Secondary | ICD-10-CM

## 2022-03-15 DIAGNOSIS — D649 Anemia, unspecified: Secondary | ICD-10-CM | POA: Insufficient documentation

## 2022-03-15 LAB — CBC WITH DIFFERENTIAL/PLATELET
Abs Immature Granulocytes: 0.01 10*3/uL (ref 0.00–0.07)
Basophils Absolute: 0 10*3/uL (ref 0.0–0.1)
Basophils Relative: 0 %
Eosinophils Absolute: 0 10*3/uL (ref 0.0–0.5)
Eosinophils Relative: 1 %
HCT: 33.1 % — ABNORMAL LOW (ref 36.0–46.0)
Hemoglobin: 10.7 g/dL — ABNORMAL LOW (ref 12.0–15.0)
Immature Granulocytes: 0 %
Lymphocytes Relative: 25 %
Lymphs Abs: 1.5 10*3/uL (ref 0.7–4.0)
MCH: 27.7 pg (ref 26.0–34.0)
MCHC: 32.3 g/dL (ref 30.0–36.0)
MCV: 85.8 fL (ref 80.0–100.0)
Monocytes Absolute: 0.7 10*3/uL (ref 0.1–1.0)
Monocytes Relative: 11 %
Neutro Abs: 3.8 10*3/uL (ref 1.7–7.7)
Neutrophils Relative %: 63 %
Platelets: 194 10*3/uL (ref 150–400)
RBC: 3.86 MIL/uL — ABNORMAL LOW (ref 3.87–5.11)
RDW: 13.9 % (ref 11.5–15.5)
WBC: 6 10*3/uL (ref 4.0–10.5)
nRBC: 0 % (ref 0.0–0.2)

## 2022-03-15 LAB — COMPREHENSIVE METABOLIC PANEL
ALT: 26 U/L (ref 0–44)
AST: 24 U/L (ref 15–41)
Albumin: 3.4 g/dL — ABNORMAL LOW (ref 3.5–5.0)
Alkaline Phosphatase: 71 U/L (ref 38–126)
Anion gap: 6 (ref 5–15)
BUN: 27 mg/dL — ABNORMAL HIGH (ref 8–23)
CO2: 28 mmol/L (ref 22–32)
Calcium: 8.4 mg/dL — ABNORMAL LOW (ref 8.9–10.3)
Chloride: 106 mmol/L (ref 98–111)
Creatinine, Ser: 1.43 mg/dL — ABNORMAL HIGH (ref 0.44–1.00)
GFR, Estimated: 35 mL/min — ABNORMAL LOW (ref >60)
Glucose, Bld: 299 mg/dL — ABNORMAL HIGH (ref 70–99)
Potassium: 4 mmol/L (ref 3.5–5.1)
Sodium: 140 mmol/L (ref 135–145)
Total Bilirubin: 0.6 mg/dL (ref 0.3–1.2)
Total Protein: 6.6 g/dL (ref 6.5–8.1)

## 2022-03-15 LAB — IRON AND TIBC
Iron: 77 ug/dL (ref 28–170)
Saturation Ratios: 32 % — ABNORMAL HIGH (ref 10.4–31.8)
TIBC: 241 ug/dL — ABNORMAL LOW (ref 250–450)
UIBC: 164 ug/dL

## 2022-03-15 LAB — FERRITIN: Ferritin: 462 ng/mL — ABNORMAL HIGH (ref 11–307)

## 2022-03-15 MED ORDER — GLOBAL EASE INJECT PEN NEEDLES 31G X 5 MM MISC: 1.0000 | Freq: Four times a day (QID) | 2 refills | Status: DC

## 2022-03-15 NOTE — Telephone Encounter (Signed)
Rx sent 

## 2022-03-15 NOTE — Telephone Encounter (Signed)
Betty [name] with [facility], called stating that pt is out of her pen needles. Can you please refill?   [place]

## 2022-03-17 ENCOUNTER — Ambulatory Visit (INDEPENDENT_AMBULATORY_CARE_PROVIDER_SITE_OTHER): Payer: Medicare HMO | Admitting: Nurse Practitioner

## 2022-03-17 ENCOUNTER — Encounter: Payer: Self-pay | Admitting: Nurse Practitioner

## 2022-03-17 VITALS — BP 192/80 | HR 61 | Ht 61.0 in | Wt 138.0 lb

## 2022-03-17 DIAGNOSIS — N1832 Chronic kidney disease, stage 3b: Secondary | ICD-10-CM | POA: Diagnosis not present

## 2022-03-17 DIAGNOSIS — E782 Mixed hyperlipidemia: Secondary | ICD-10-CM

## 2022-03-17 DIAGNOSIS — Z139 Encounter for screening, unspecified: Secondary | ICD-10-CM | POA: Diagnosis not present

## 2022-03-17 DIAGNOSIS — E1122 Type 2 diabetes mellitus with diabetic chronic kidney disease: Secondary | ICD-10-CM | POA: Diagnosis not present

## 2022-03-17 DIAGNOSIS — Z794 Long term (current) use of insulin: Secondary | ICD-10-CM

## 2022-03-17 DIAGNOSIS — I10 Essential (primary) hypertension: Secondary | ICD-10-CM | POA: Diagnosis not present

## 2022-03-17 DIAGNOSIS — R69 Illness, unspecified: Secondary | ICD-10-CM | POA: Diagnosis not present

## 2022-03-17 DIAGNOSIS — F419 Anxiety disorder, unspecified: Secondary | ICD-10-CM

## 2022-03-17 DIAGNOSIS — F03B4 Unspecified dementia, moderate, with anxiety: Secondary | ICD-10-CM

## 2022-03-17 DIAGNOSIS — F5104 Psychophysiologic insomnia: Secondary | ICD-10-CM

## 2022-03-17 NOTE — Assessment & Plan Note (Signed)
Condition well-controlled on trazodone 100 mg at bedtime

## 2022-03-17 NOTE — Patient Instructions (Addendum)
Please monitor blood pressure at least 3 times a week, keep a log and bring to next follow up appointment , Blood pressure goal is less than 130/80.     Fasting labs next week.    It is important that you exercise regularly at least 30 minutes 5 times a week as tolerated   Think about what you will eat, plan ahead. Choose " clean, green, fresh or frozen" over canned, processed or packaged foods which are more sugary, salty and fatty. 70 to 75% of food eaten should be vegetables and fruit. Three meals at set times with snacks allowed between meals, but they must be fruit or vegetables. Aim to eat over a 12 hour period , example 7 am to 7 pm, and STOP after  your last meal of the day. Drink water,generally about 64 ounces per day, no other drink is as healthy. Fruit juice is best enjoyed in a healthy way, by EATING the fruit.  Thanks for choosing [facility], we consider it a privelige to serve you.

## 2022-03-17 NOTE — Assessment & Plan Note (Signed)
Lab Results  Component Value Date   NA 140 03/15/2022   K 4.0 03/15/2022   CO2 28 03/15/2022   GLUCOSE 299 (H) 03/15/2022   BUN 27 (H) 03/15/2022   CREATININE 1.43 (H) 03/15/2022   CALCIUM 8.4 (L) 03/15/2022   EGFR 37 (L) 12/13/2021   GFRNONAA 35 (L) 03/15/2022  Chronic condition Family refused to referal to nephrology Avoid nephrotoxic agents drink at least 64 ounces of water daily On losartan 25 mg daily

## 2022-03-17 NOTE — Assessment & Plan Note (Signed)
Condition well-controlled no reports of behavior issues today Currently on Namzaric 20-10 mg 1 tablet daily

## 2022-03-17 NOTE — Assessment & Plan Note (Addendum)
Lab Results  Component Value Date   HGBA1C 7.1 (H) 10/01/2021    Currently on NovoLog sliding scale 3 times daily with meals, Levemir 6 units in the morning 4 units at night, no reports of hypoglycemic episodes. A1c ordered Patient's son refused diabetic eye exam Avoid sugar sweets soda

## 2022-03-17 NOTE — Assessment & Plan Note (Signed)
Well controlled on buspirone 5 mg twice daily Continue current medication

## 2022-03-17 NOTE — Progress Notes (Signed)
Betty [name]     MRN: [id number]      DOB: [birth year]   HPI [name] with past medical history of hypertension, type 2 diabetes, CKD stage III, is here for follow up and re-evaluation of chronic medical conditions, medication management.  Patient is accompanied to today's visit by her son.    Hypertension .  Currently on hydralazine 25 mg twice daily, losartan 25 mg daily, metoprolol 25 mg twice daily reports BP readings of BP 122/60, 143/86, 107/52, 150/70 readings with taking at the facility this week.  No complaints of headache, dizziness, chest pain, edema  Type 2 diabetes.  Currently on NovoLog sliding scale, Levemir 6 units in the morning 4 units at night, no reports of hypoglycemic episodes.  Patient's son declined diabetic eye exam.    ROS Denies recent fever or chills. Denies sinus pressure, nasal congestion, ear pain or sore throat. Denies chest congestion, productive cough or wheezing. Denies chest pains, palpitations and leg swelling Denies abdominal pain, nausea, vomiting,diarrhea or constipation.   Denies dysuria, frequency, hesitancy or incontinence. Denies joint pain, swelling and limitation in mobility. Denies headaches, seizures, numbness, or tingling. Denies depression, anxiety or insomnia.    PE  BP (!) 192/80 (BP Location: Left Arm, Cuff Size: Normal)   Pulse 61   Ht 5' 1"  (1.549 m)   Wt 138 lb (62.6 kg)   SpO2 96%   BMI 26.07 kg/m   Patient alert and oriented and in no cardiopulmonary distress.  Chest: Clear to auscultation bilaterally.  CVS: S1, S2 no murmurs, no S3.Regular rate.  ABD: Soft non tender.   Ext: No edema  MS: Adequate ROM spine, shoulders, hips and knees.  Psych: Good eye contact, normal affect. Memory at baseline  not anxious or depressed appearing.    Assessment & Plan Essential hypertension, benign BP Readings from Last 3 Encounters:  03/17/22 (!) 192/80  12/13/21 (!) 199/71  11/30/21 (!) 170/80  BP  122/60, 143/86, 107/52, 150/70 reported by staff at [facility] Currently on losartan 25 mg daily, hydralazine 25 mg twice daily, metoprolol 25 mg twice daily/ Blood pressure remains elevated in the office but readings reported by the caretakers at SNF were  much better.  No changes made to medications today, patient probably has whitecoat syndrome. Continue current medications Continue to monitor blood pressure at least 3 times weekly goal is for blood pressure less than 130/80 Follow-up in 4 months  Type II diabetes mellitus with renal manifestations (HCC) Lab Results  Component Value Date   HGBA1C 7.1 (H) 10/01/2021    Currently on NovoLog sliding scale 3 times daily with meals, Levemir 6 units in the morning 4 units at night, no reports of hypoglycemic episodes. A1c ordered Patient's son refused diabetic eye exam Avoid sugar sweets soda  Hyperlipidemia Lab Results  Component Value Date   CHOL 160 10/01/2021   HDL 52 10/01/2021   LDLCALC 87 10/01/2021   TRIG 119 10/01/2021   CHOLHDL 2.9 11/06/2019  On rosuvastatin 20 mg daily Check lipid panel Avoid fried fatty foods  CKD (chronic kidney disease), stage III (HCC) Lab Results  Component Value Date   NA 140 03/15/2022   K 4.0 03/15/2022   CO2 28 03/15/2022   GLUCOSE 299 (H) 03/15/2022   BUN 27 (H) 03/15/2022   CREATININE 1.43 (H) 03/15/2022   CALCIUM 8.4 (L) 03/15/2022   EGFR 37 (L) 12/13/2021   GFRNONAA 35 (L) 03/15/2022  Chronic condition Family refused to referal to nephrology Avoid  nephrotoxic agents drink at least 64 ounces of water daily On losartan 25 mg daily  Anxiety Well controlled on buspirone 5 mg twice daily Continue current medication  Chronic insomnia Condition well-controlled on trazodone 100 mg at bedtime   Dementia (HCC) Condition well-controlled no reports of behavior issues today Currently on Namzaric 20-10 mg 1 tablet daily

## 2022-03-17 NOTE — Assessment & Plan Note (Addendum)
BP Readings from Last 3 Encounters:  03/17/22 (!) 192/80  12/13/21 (!) 199/71  11/30/21 (!) 170/80  BP 122/60, 143/86, 107/52, 150/70 reported by staff at [facility] Currently on losartan 25 mg daily, hydralazine 25 mg twice daily, metoprolol 25 mg twice daily/ Blood pressure remains elevated in the office but readings reported by the caretakers at SNF were  much better.  No changes made to medications today, patient probably has whitecoat syndrome. Continue current medications Continue to monitor blood pressure at least 3 times weekly goal is for blood pressure less than 130/80 Follow-up in 4 months

## 2022-03-17 NOTE — Assessment & Plan Note (Addendum)
Lab Results  Component Value Date   CHOL 160 10/01/2021   HDL 52 10/01/2021   LDLCALC 87 10/01/2021   TRIG 119 10/01/2021   CHOLHDL 2.9 11/06/2019  On rosuvastatin 20 mg daily Check lipid panel Avoid fried fatty foods

## 2022-03-22 ENCOUNTER — Inpatient Hospital Stay (HOSPITAL_COMMUNITY): Payer: Medicare HMO | Admitting: Physician Assistant

## 2022-03-22 ENCOUNTER — Other Ambulatory Visit: Payer: Self-pay | Admitting: Family Medicine

## 2022-03-24 DIAGNOSIS — I10 Essential (primary) hypertension: Secondary | ICD-10-CM | POA: Diagnosis not present

## 2022-03-24 DIAGNOSIS — E782 Mixed hyperlipidemia: Secondary | ICD-10-CM | POA: Diagnosis not present

## 2022-03-24 DIAGNOSIS — Z139 Encounter for screening, unspecified: Secondary | ICD-10-CM | POA: Diagnosis not present

## 2022-03-24 DIAGNOSIS — E785 Hyperlipidemia, unspecified: Secondary | ICD-10-CM | POA: Diagnosis not present

## 2022-03-24 DIAGNOSIS — E1129 Type 2 diabetes mellitus with other diabetic kidney complication: Secondary | ICD-10-CM | POA: Diagnosis not present

## 2022-03-25 ENCOUNTER — Other Ambulatory Visit: Payer: Self-pay | Admitting: Nurse Practitioner

## 2022-03-25 DIAGNOSIS — E559 Vitamin D deficiency, unspecified: Secondary | ICD-10-CM

## 2022-03-25 LAB — LIPID PANEL
Chol/HDL Ratio: 3 ratio (ref 0.0–4.4)
Cholesterol, Total: 155 mg/dL (ref 100–199)
HDL: 51 mg/dL (ref >39)
LDL Chol Calc (NIH): 86 mg/dL (ref 0–99)
Triglycerides: 98 mg/dL (ref 0–149)
VLDL Cholesterol Cal: 18 mg/dL (ref 5–40)

## 2022-03-25 LAB — VITAMIN D 25 HYDROXY (VIT D DEFICIENCY, FRACTURES): Vit D, 25-Hydroxy: 16.7 ng/mL — ABNORMAL LOW (ref 30.0–100.0)

## 2022-03-25 LAB — HEMOGLOBIN A1C
Est. average glucose Bld gHb Est-mCnc: 160 mg/dL
Hgb A1c MFr Bld: 7.2 % — ABNORMAL HIGH (ref 4.8–5.6)

## 2022-03-25 MED ORDER — VITAMIN D (ERGOCALCIFEROL) 1.25 MG (50000 UNIT) PO CAPS: 50000.0000 [IU] | ORAL_CAPSULE | ORAL | 0 refills | Status: DC

## 2022-04-07 DIAGNOSIS — F0390 Unspecified dementia without behavioral disturbance: Secondary | ICD-10-CM | POA: Diagnosis not present

## 2022-04-07 DIAGNOSIS — R2689 Other abnormalities of gait and mobility: Secondary | ICD-10-CM | POA: Diagnosis not present

## 2022-04-07 DIAGNOSIS — R69 Illness, unspecified: Secondary | ICD-10-CM | POA: Diagnosis not present

## 2022-04-07 DIAGNOSIS — Z593 Problems related to living in residential institution: Secondary | ICD-10-CM | POA: Diagnosis not present

## 2022-04-13 NOTE — Progress Notes (Deleted)
NO SHOW

## 2022-04-14 ENCOUNTER — Inpatient Hospital Stay (HOSPITAL_COMMUNITY): Payer: Medicare HMO | Attending: Hematology | Admitting: Physician Assistant

## 2022-04-14 DIAGNOSIS — Z743 Need for continuous supervision: Secondary | ICD-10-CM | POA: Diagnosis not present

## 2022-04-14 DIAGNOSIS — E785 Hyperlipidemia, unspecified: Secondary | ICD-10-CM | POA: Diagnosis not present

## 2022-04-14 DIAGNOSIS — I959 Hypotension, unspecified: Secondary | ICD-10-CM | POA: Diagnosis not present

## 2022-04-14 DIAGNOSIS — R69 Illness, unspecified: Secondary | ICD-10-CM | POA: Diagnosis not present

## 2022-04-14 DIAGNOSIS — N183 Chronic kidney disease, stage 3 unspecified: Secondary | ICD-10-CM | POA: Diagnosis not present

## 2022-04-14 DIAGNOSIS — F03C0 Unspecified dementia, severe, without behavioral disturbance, psychotic disturbance, mood disturbance, and anxiety: Secondary | ICD-10-CM | POA: Diagnosis not present

## 2022-04-14 DIAGNOSIS — R739 Hyperglycemia, unspecified: Secondary | ICD-10-CM | POA: Diagnosis not present

## 2022-04-14 DIAGNOSIS — I129 Hypertensive chronic kidney disease with stage 1 through stage 4 chronic kidney disease, or unspecified chronic kidney disease: Secondary | ICD-10-CM | POA: Diagnosis not present

## 2022-04-14 DIAGNOSIS — I10 Essential (primary) hypertension: Secondary | ICD-10-CM | POA: Diagnosis not present

## 2022-04-14 DIAGNOSIS — R4182 Altered mental status, unspecified: Secondary | ICD-10-CM | POA: Diagnosis not present

## 2022-04-18 ENCOUNTER — Telehealth: Payer: Self-pay

## 2022-04-18 ENCOUNTER — Other Ambulatory Visit: Payer: Self-pay

## 2022-04-18 DIAGNOSIS — I10 Essential (primary) hypertension: Secondary | ICD-10-CM

## 2022-04-18 MED ORDER — ONETOUCH DELICA PLUS LANCET33G MISC: 11 refills | Status: DC

## 2022-04-18 MED ORDER — LOSARTAN POTASSIUM 25 MG PO TABS: 25.0000 mg | ORAL_TABLET | Freq: Every day | ORAL | 1 refills | Status: DC

## 2022-04-18 NOTE — Telephone Encounter (Signed)
Patient called need med refill   losartan (COZAAR) 25 MG tablet   Lancets (ONETOUCH DELICA PLUS [id number]) [place] [   Pharmacy:  [facility]

## 2022-04-18 NOTE — Telephone Encounter (Signed)
done

## 2022-04-19 ENCOUNTER — Telehealth: Payer: Self-pay

## 2022-04-19 NOTE — Telephone Encounter (Signed)
Daphnia Jimmye Norman called from [facility] called about medication and needs refill on medicine. Drug store refused to fill it because no more refills on this medicine.  Medication is too expensive, family owes over $500.00 on this medicine. Prescription was faxed to our office 6 times to fill out and refill medication.  hydrALAZINE (APRESOLINE) 25 MG tablet   Pharmacy  [facility], [address]  [address], [place] [address]  Phone:  [phone]  Fax:  [phone]

## 2022-04-20 ENCOUNTER — Other Ambulatory Visit: Payer: Self-pay

## 2022-04-20 DIAGNOSIS — I10 Essential (primary) hypertension: Secondary | ICD-10-CM

## 2022-04-20 MED ORDER — HYDRALAZINE HCL 25 MG PO TABS: 25.0000 mg | ORAL_TABLET | Freq: Two times a day (BID) | ORAL | 3 refills | Status: DC

## 2022-04-20 NOTE — Telephone Encounter (Signed)
Refills sent

## 2022-05-03 ENCOUNTER — Other Ambulatory Visit: Payer: Self-pay

## 2022-05-03 DIAGNOSIS — E559 Vitamin D deficiency, unspecified: Secondary | ICD-10-CM

## 2022-05-03 MED ORDER — ONETOUCH VERIO VI STRP: ORAL_STRIP | 5 refills | Status: DC

## 2022-05-03 MED ORDER — VITAMIN D (ERGOCALCIFEROL) 1.25 MG (50000 UNIT) PO CAPS: 50000.0000 [IU] | ORAL_CAPSULE | ORAL | 5 refills | Status: AC

## 2022-05-03 MED ORDER — ALCOHOL PREP PADS: MEDICATED_PAD | 5 refills | Status: DC

## 2022-05-04 ENCOUNTER — Telehealth: Payer: Self-pay

## 2022-05-04 NOTE — Telephone Encounter (Signed)
[name]  Copied Noted Sleeved  Call son Adelina Collard when ready for pick up [phone].

## 2022-05-06 DIAGNOSIS — Z0279 Encounter for issue of other medical certificate: Secondary | ICD-10-CM

## 2022-05-06 NOTE — Telephone Encounter (Signed)
Called patient son Clayvone left voicemail that the forms are ready for pickup.

## 2022-05-11 DIAGNOSIS — Z0279 Encounter for issue of other medical certificate: Secondary | ICD-10-CM

## 2022-05-12 NOTE — Telephone Encounter (Signed)
Called Clayvone (son) left voice mail the revised forms are ready for pickup.

## 2022-05-20 DIAGNOSIS — R69 Illness, unspecified: Secondary | ICD-10-CM | POA: Diagnosis not present

## 2022-05-20 DIAGNOSIS — B349 Viral infection, unspecified: Secondary | ICD-10-CM | POA: Diagnosis not present

## 2022-05-20 DIAGNOSIS — F411 Generalized anxiety disorder: Secondary | ICD-10-CM | POA: Diagnosis not present

## 2022-05-20 DIAGNOSIS — F0390 Unspecified dementia without behavioral disturbance: Secondary | ICD-10-CM | POA: Diagnosis not present

## 2022-05-20 DIAGNOSIS — R197 Diarrhea, unspecified: Secondary | ICD-10-CM | POA: Diagnosis not present

## 2022-05-25 ENCOUNTER — Telehealth: Payer: Self-pay | Admitting: Nurse Practitioner

## 2022-05-25 ENCOUNTER — Other Ambulatory Visit: Payer: Self-pay

## 2022-05-25 DIAGNOSIS — I10 Essential (primary) hypertension: Secondary | ICD-10-CM

## 2022-05-25 MED ORDER — DOCUSATE SODIUM 100 MG PO CAPS: 100.0000 mg | ORAL_CAPSULE | Freq: Two times a day (BID) | ORAL | 2 refills | Status: DC

## 2022-05-25 MED ORDER — LOSARTAN POTASSIUM 25 MG PO TABS: 25.0000 mg | ORAL_TABLET | Freq: Every day | ORAL | 1 refills | Status: DC

## 2022-05-25 NOTE — Telephone Encounter (Signed)
[facility] called stating pt is needing a refill on losartan (COZAAR) 25 MG tablet and Docusate '100mg'$ ??.    Laynes Phar

## 2022-05-25 NOTE — Telephone Encounter (Signed)
Rx sent 

## 2022-06-16 DIAGNOSIS — E1122 Type 2 diabetes mellitus with diabetic chronic kidney disease: Secondary | ICD-10-CM | POA: Diagnosis not present

## 2022-06-16 DIAGNOSIS — F0280 Dementia in other diseases classified elsewhere without behavioral disturbance: Secondary | ICD-10-CM | POA: Diagnosis not present

## 2022-06-16 DIAGNOSIS — G4701 Insomnia due to medical condition: Secondary | ICD-10-CM | POA: Diagnosis not present

## 2022-06-16 DIAGNOSIS — R69 Illness, unspecified: Secondary | ICD-10-CM | POA: Diagnosis not present

## 2022-07-11 ENCOUNTER — Ambulatory Visit (INDEPENDENT_AMBULATORY_CARE_PROVIDER_SITE_OTHER): Payer: Medicare HMO | Admitting: Internal Medicine

## 2022-07-11 ENCOUNTER — Encounter: Payer: Self-pay | Admitting: Internal Medicine

## 2022-07-11 DIAGNOSIS — Z0000 Encounter for general adult medical examination without abnormal findings: Secondary | ICD-10-CM

## 2022-07-11 NOTE — Progress Notes (Signed)
Subjective:  This is a telephone encounter between [employer] and Lorene Dy on 07/11/2022 for AWV. The visit was conducted with the patient located at [facility] and Lorene Dy at [facility]. The patient's identity was confirmed using their DOB and current address. The his/her legal guardian has consented to being evaluated through a telephone encounter and understands the associated risks (an examination cannot be done and the patient may need to come in for an appointment) / benefits (allows the patient to remain at home, decreasing exposure to coronavirus).      SERRIA SLOMA is a 88 y.o. female who presents for Medicare Annual (Subsequent) preventive examination.  Review of Systems    Unable to complete. Patient has dementia and caretaker was the historian.        Objective:    There were no vitals filed for this visit. There is no height or weight on file to calculate BMI.     12/13/2021    1:04 PM 11/19/2021   12:04 PM 07/07/2021   11:05 AM 03/09/2020    8:43 AM 05/03/2018   11:26 AM 05/03/2018   11:15 AM 04/17/2018    1:04 PM  Advanced Directives  Does Patient Have a Medical Advance Directive? No No No Yes Yes Yes Yes  Does patient want to make changes to medical advance directive?    No - Guardian declined No - Patient declined No - Patient declined No - Patient declined  Would patient like information on creating a medical advance directive? No - Patient declined No - Patient declined No - Patient declined  No - Patient declined No - Patient declined No - Patient declined    Current Medications (verified) Outpatient Encounter Medications as of 07/11/2022  Medication Sig   Alcohol Swabs (ALCOHOL PREP) PADS Use to check blood sugar daily   BD PEN NEEDLE NANO U/F 32G X 4 MM MISC USE AS DIRECTED TO INJECT NOVOLOG 3 TIMES A DAY AND LANTUS AT BEDTIME.   Blood Glucose Monitoring Suppl (BLOOD GLUCOSE SYSTEM PAK) KIT Use as instructed for 3x daily testing. Dx:  E11.22.   busPIRone (BUSPAR) 5 MG tablet TAKE 1 TABLET BY MOUTH TWICE DAILY.   docusate sodium (COLACE) 100 MG capsule Take 1 capsule (100 mg total) by mouth 2 (two) times daily.   ELIQUIS 2.5 MG TABS tablet TAKE 1 TABLET BY MOUTH TWICE DAILY.   GLOBAL EASE INJECT PEN NEEDLES 31G X 5 MM MISC Inject 1 Syringe into the skin 4 (four) times daily.   glucose blood (ONETOUCH VERIO) test strip USE TO TEST BLOOD SUGAR 3 TIMES A DAY. Dx e11.65   hydrALAZINE (APRESOLINE) 25 MG tablet Take 1 tablet (25 mg total) by mouth 2 (two) times daily.   insulin aspart (NOVOLOG FLEXPEN) 100 UNIT/ML FlexPen Inject per sliding scale 3x daily with meals: 151-200 2U, 201-250 4U, 251- 300 6U, 301-350 8U, 351-400 10U, <60/>400 call MD   insulin detemir (LEVEMIR FLEXTOUCH) 100 UNIT/ML FlexPen 6U QAM and 4U QHS   Lancet Devices (ONETOUCH DELICA PLUS LANCING) MISC    Lancets (ONETOUCH DELICA PLUS [id number]) MISC USE TO TEST BLOOD SUGAR 3 TIMES DAILY   losartan (COZAAR) 25 MG tablet Take 1 tablet (25 mg total) by mouth daily.   metoprolol tartrate (LOPRESSOR) 25 MG tablet TAKE 1 TABLET BY MOUTH TWICE DAILY.   mirtazapine (REMERON) 15 MG tablet TAKE 1/2 TABLET BY MOUTH AT BEDTIME.   NAMZARIC 28-10 MG CP24 TAKE 1 CAPSULE BY MOUTH  AT BEDTIME.   OYSTER SHELL CALCIUM PLUS D 500-5 MG-MCG TABS TAKE 1 TABLET EVERY DAY   rosuvastatin (CRESTOR) 20 MG tablet TAKE 1 TABLET BY MOUTH AT BEDTIME.   traZODone (DESYREL) 100 MG tablet TAKE 1 TABLET BY MOUTH AT BEDTIME.   Vitamin D, Ergocalciferol, (DRISDOL) 1.25 MG (50000 UNIT) CAPS capsule Take 1 capsule (50,000 Units total) by mouth every 7 (seven) days.   [DISCONTINUED] benzoyl peroxide-erythromycin (BENZAMYCIN) gel Apply topically.   [DISCONTINUED] ketoconazole (NIZORAL) 2 % cream SMARTSIG:1 Topical Daily   No facility-administered encounter medications on file as of 07/11/2022.    Allergies (verified) Thiazide-type diuretics   History: Past Medical History:  Diagnosis Date    Alzheimer disease ([place])    Chronic kidney disease    stage II   Closed intertrochanteric fracture of femur ([place]) 03/27/2018   Dementia ([place])    Diabetes mellitus    Grade I diastolic dysfunction 75/64/3329   Hyperlipidemia    Hypertension    Past Surgical History:  Procedure Laterality Date   ABDOMINAL HYSTERECTOMY     BACK SURGERY     INTRAMEDULLARY (IM) NAIL INTERTROCHANTERIC Left 03/28/2018   Procedure: INTRAMEDULLARY (IM) NAIL INTERTROCHANTRIC;  Surgeon: Carole Civil, MD;  Location: [facility];  Service: Orthopedics;  Laterality: Left;   Family History  Problem Relation Age of Onset   Hypertension Mother    Cancer Father        colon    Cancer Sister        Breast cancer   Osteoporosis Sister    Social History   Socioeconomic History   Marital status: Widowed    Spouse name: Not on file   Number of children: 4   Years of education: Not on file   Highest education level: Not on file  Occupational History   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   Social Determinants of Health   Financial Resource Strain: Low Risk  (07/07/2021)   Overall Financial Resource Strain (CARDIA)    Difficulty of Paying Living Expenses: Not hard at all  Food Insecurity: No Food Insecurity (07/07/2021)   Hunger Vital Sign    Worried About Running Out of Food in the Last Year: Never true    Ran Out of Food in the Last Year: Never true  Transportation Needs: No Transportation Needs (07/07/2021)   PRAPARE - Hydrologist (Medical): No    Lack of  Transportation (Non-Medical): No  Physical Activity: Sufficiently Active (07/07/2021)   Exercise Vital Sign    Days of Exercise per Week: 5 days    Minutes of Exercise per Session: 30 min  Stress: Not on file  Social Connections: Socially Isolated (07/07/2021)   Social Connection and Isolation Panel [NHANES]    Frequency of Communication with Friends and Family: Never    Frequency of Social Gatherings with Friends and Family: Twice a week    Attends Religious Services: Never    Marine scientist or Organizations: No  Attends Archivist Meetings: Never    Marital Status: Widowed    Tobacco Counseling Counseling given: Not Answered   Clinical Intake:  Pre-visit preparation completed: Yes  Pain : No/denies pain  How often do you need to have someone help you when you read instructions, pamphlets, or other written materials from your doctor or pharmacy?: 5 - Always  Diabetic?Yes   Activities of Daily Living     No data to display          Patient Care Team: Renee Rival, FNP as PCP - General (Nurse Practitioner) Derek Jack, MD as Medical Oncologist (Hematology)  Indicate any recent Medical Services you may have received from other than [facility] providers in the past year (date may be approximate).     Assessment:   This is a routine wellness examination for [place].  Hearing/Vision screen No results found.  Dietary issues and exercise activities discussed:     Goals Addressed   None    Depression Screen    03/17/2022    4:13 PM 11/30/2021   10:39 AM 11/08/2021    9:47 AM 10/06/2021    9:38 AM 07/07/2021   11:16 AM 05/25/2021    2:09 PM 03/09/2020    8:41 AM  PHQ 2/9 Scores  PHQ - 2 Score 0 0 0 0  0   Exception Documentation     Medical reason  Medical reason    Fall Risk    03/17/2022    4:13 PM 11/30/2021   10:39 AM 11/08/2021    9:47 AM 10/06/2021    9:38 AM 07/07/2021   11:05 AM  [place] in the past year? 0 0 0 0  0  Number falls in past yr: 0 0 0 0 0  Injury with Fall? 0 0 0 0 0  Risk for fall due to : No Fall Risks No Fall Risks No Fall Risks Impaired balance/gait;Impaired mobility Impaired mobility;Impaired vision;Impaired balance/gait  Follow up Falls evaluation completed Falls evaluation completed Falls evaluation completed Falls evaluation completed;Education provided;Falls prevention discussed     FALL RISK PREVENTION PERTAINING TO THE HOME:  Any stairs in or around the home? No  If so, are there any without handrails? No  Home free of loose throw rugs in walkways, pet beds, electrical cords, etc? Yes  Adequate lighting in your home to reduce risk of falls? Yes   ASSISTIVE DEVICES UTILIZED TO PREVENT FALLS:  Life alert? No  Use of a cane, walker or w/c? Yes  Grab bars in the bathroom? Yes  Shower chair or bench in shower? Yes  Elevated toilet seat or a handicapped toilet? Yes    Immunizations Immunization History  Administered Date(s) Administered   Fluad Quad(high Dose 65+) 07/29/2019, 07/30/2021   Influenza Split 06/21/2012   Influenza, High Dose Seasonal PF 07/11/2017, 09/19/2018   Influenza,inj,Quad PF,6+ Mos 07/02/2013, 07/30/2014, 09/04/2015, 08/05/2016   Influenza-Unspecified 08/02/2001, 07/17/2002, 07/27/2006   Moderna SARS-COV2 Booster Vaccination 07/30/2021   PFIZER(Purple Top)SARS-COV-2 Vaccination 11/24/2019, 12/18/2019   Pneumococcal Conjugate-13 11/01/2013   Pneumococcal Polysaccharide-23 03/29/2012   Td 03/14/2000   Zoster, Live 09/20/2012    TDAP status: Due, Education has been provided regarding the importance of this vaccine. Advised may receive this vaccine at local pharmacy or Health Dept. Aware to provide a copy of the vaccination record if obtained from local pharmacy or Health Dept. Verbalized acceptance and understanding.  Flu Vaccine status: Due, Education has been provided regarding the importance of this  vaccine. Advised may receive this vaccine at  local pharmacy or Health Dept. Aware to provide a copy of the vaccination record if obtained from local pharmacy or Health Dept. Verbalized acceptance and understanding.  Pneumococcal vaccine status: Up to date  Covid-19 vaccine status: Information provided on how to obtain vaccines.   Qualifies for Shingles Vaccine? Yes   Zostavax completed No   Shingrix Completed?: No.    Education has been provided regarding the importance of this vaccine. Patient has been advised to call insurance company to determine out of pocket expense if they have not yet received this vaccine. Advised may also receive vaccine at local pharmacy or Health Dept. Verbalized acceptance and understanding.  Screening Tests Health Maintenance  Topic Date Due   Zoster Vaccines- Shingrix (1 of 2) Never done   TETANUS/TDAP  03/14/2010   OPHTHALMOLOGY EXAM  01/14/2016   COVID-19 Vaccine (3 - Pfizer series) 09/24/2021   INFLUENZA VACCINE  05/03/2022   HEMOGLOBIN A1C  09/23/2022   FOOT EXAM  10/06/2022   Pneumonia Vaccine 37+ Years old  Completed   HPV VACCINES  Aged Out   DEXA SCAN  Discontinued    Health Maintenance  Health Maintenance Due  Topic Date Due   Zoster Vaccines- Shingrix (1 of 2) Never done   TETANUS/TDAP  03/14/2010   OPHTHALMOLOGY EXAM  01/14/2016   COVID-19 Vaccine (3 - Pfizer series) 09/24/2021   INFLUENZA VACCINE  05/03/2022    Colorectal cancer screening: No longer required.   Mammogram status: No longer required due to Dementia.  Bone Density status: Discontinued.  Lung Cancer Screening: (Low Dose CT Chest recommended if Age 37-80 years, 30 pack-year currently smoking OR have quit w/in 15years.) does not qualify.   Lung Cancer Screening Referral:   Additional Screening:  Hepatitis C Screening: does not qualify  Vision Screening: Recommended annual ophthalmology exams for early detection of glaucoma and other disorders of the eye. Is the patient up to date with their annual eye exam?   No  Who is the provider or what is the name of the office in which the patient attends annual eye exams? Unable to provide If pt is not established with a provider, would they like to be referred to a provider to establish care? No .   Dental Screening: Recommended annual dental exams for proper oral hygiene  Community Resource Referral / Chronic Care Management: CRR required this visit?  No   CCM required this visit?  No      Plan:     I have personally reviewed and noted the following in the patient's chart:   Medical and social history Use of alcohol, tobacco or illicit drugs  Current medications and supplements including opioid prescriptions. Patient is not currently taking opioid prescriptions. Functional ability and status Nutritional status Physical activity Advanced directives List of other physicians Hospitalizations, surgeries, and ER visits in previous 12 months Vitals Screenings to include cognitive, depression, and falls Referrals and appointments  In addition, I have reviewed and discussed with patient certain preventive protocols, quality metrics, and best practice recommendations. A written personalized care plan for preventive services as well as general preventive health recommendations were provided to patient.     Lorene Dy, MD   07/11/2022

## 2022-07-18 ENCOUNTER — Ambulatory Visit: Payer: Medicare HMO | Admitting: Nurse Practitioner

## 2022-07-18 DIAGNOSIS — R69 Illness, unspecified: Secondary | ICD-10-CM | POA: Diagnosis not present

## 2022-07-18 DIAGNOSIS — Z593 Problems related to living in residential institution: Secondary | ICD-10-CM | POA: Diagnosis not present

## 2022-07-18 DIAGNOSIS — F0280 Dementia in other diseases classified elsewhere without behavioral disturbance: Secondary | ICD-10-CM | POA: Diagnosis not present

## 2022-07-18 DIAGNOSIS — F411 Generalized anxiety disorder: Secondary | ICD-10-CM | POA: Diagnosis not present

## 2022-07-19 ENCOUNTER — Ambulatory Visit: Payer: Medicare HMO | Admitting: Nurse Practitioner

## 2022-07-19 ENCOUNTER — Ambulatory Visit: Payer: Medicare HMO | Admitting: Family Medicine

## 2022-07-21 ENCOUNTER — Telehealth: Payer: Self-pay

## 2022-07-21 NOTE — Telephone Encounter (Signed)
[name] called from [facility] home patient is needing some pen needles and alcohol pads.

## 2022-07-25 ENCOUNTER — Encounter: Payer: Self-pay | Admitting: Family Medicine

## 2022-07-25 ENCOUNTER — Other Ambulatory Visit: Payer: Self-pay

## 2022-07-25 ENCOUNTER — Ambulatory Visit (INDEPENDENT_AMBULATORY_CARE_PROVIDER_SITE_OTHER): Payer: Medicare HMO | Admitting: Family Medicine

## 2022-07-25 VITALS — BP 132/84 | HR 72 | Ht 60.5 in | Wt 146.1 lb

## 2022-07-25 DIAGNOSIS — N1832 Chronic kidney disease, stage 3b: Secondary | ICD-10-CM | POA: Diagnosis not present

## 2022-07-25 DIAGNOSIS — E1122 Type 2 diabetes mellitus with diabetic chronic kidney disease: Secondary | ICD-10-CM | POA: Diagnosis not present

## 2022-07-25 DIAGNOSIS — D649 Anemia, unspecified: Secondary | ICD-10-CM

## 2022-07-25 DIAGNOSIS — F5104 Psychophysiologic insomnia: Secondary | ICD-10-CM

## 2022-07-25 DIAGNOSIS — E038 Other specified hypothyroidism: Secondary | ICD-10-CM

## 2022-07-25 DIAGNOSIS — Z23 Encounter for immunization: Secondary | ICD-10-CM | POA: Diagnosis not present

## 2022-07-25 DIAGNOSIS — Z794 Long term (current) use of insulin: Secondary | ICD-10-CM | POA: Diagnosis not present

## 2022-07-25 DIAGNOSIS — I10 Essential (primary) hypertension: Secondary | ICD-10-CM

## 2022-07-25 DIAGNOSIS — E559 Vitamin D deficiency, unspecified: Secondary | ICD-10-CM

## 2022-07-25 DIAGNOSIS — E7849 Other hyperlipidemia: Secondary | ICD-10-CM | POA: Diagnosis not present

## 2022-07-25 DIAGNOSIS — R69 Illness, unspecified: Secondary | ICD-10-CM | POA: Diagnosis not present

## 2022-07-25 MED ORDER — TRAZODONE HCL 100 MG PO TABS: 100.0000 mg | ORAL_TABLET | Freq: Every evening | ORAL | 0 refills | Status: DC | PRN

## 2022-07-25 MED ORDER — BD PEN NEEDLE NANO U/F 32G X 4 MM MISC: 0 refills | Status: DC

## 2022-07-25 MED ORDER — ALCOHOL PREP PADS: MEDICATED_PAD | 5 refills | Status: DC

## 2022-07-25 NOTE — Assessment & Plan Note (Addendum)
Encouraged to take trazodone nightly as needed Denies suicidal thoughts and suicidal ideation

## 2022-07-25 NOTE — Telephone Encounter (Signed)
Rx sent 

## 2022-07-25 NOTE — Assessment & Plan Note (Signed)
She takes rosuvastatin 20 mg daily Will assess lipid panel today Lab Results  Component Value Date   CHOL 155 03/24/2022   HDL 51 03/24/2022   LDLCALC 86 03/24/2022   TRIG 98 03/24/2022   CHOLHDL 3.0 03/24/2022

## 2022-07-25 NOTE — Patient Instructions (Addendum)
I appreciate the opportunity to provide care to you today!    Follow up:  4 months  Labs: please stop by during the week to get your blood drawn (CBC, CMP, TSH, Lipid profile, HgA1c, Vit D)  Please pick up your refill at the pharmacy    Please continue to a heart-healthy diet and increase your physical activities. Try to exercise for 80mns at least three times a week.      It was a pleasure to see you and I look forward to continuing to work together on your health and well-being. Please do not hesitate to call the office if you need care or have questions about your care.   Have a wonderful day and week. With Gratitude, GAlvira MondayMSN, FNP-BC

## 2022-07-25 NOTE — Assessment & Plan Note (Addendum)
Will check CBC and CMP today as the patient has been increasingly sleepy

## 2022-07-25 NOTE — Progress Notes (Signed)
Established Patient Office Visit  Subjective:  Patient ID: Betty [name], female    DOB: May 02, 1931  Age: 88 y.o. MRN: [id number]  CC:  Chief Complaint  Patient presents with   Follow-up    4 month f/u, aide with her, reports pt sleeping all day and very drowsy throughout the day.     HPI Betty [name] is a 88 y.o. female with past medical history of hypertension, hyperlipidemia, and type 2 diabetes presents for f/u of  chronic medical conditions.  The patient is seen today in the clinic with her CNA, the primary historian, who reports that she has been sleeping more frequently with increased drowsiness.  She reports that the patient has been more alert and less sleepy since stopping trazodone.  Hyperlipidemia: She takes rosuvastatin 20 mg daily.  She reports compliance with treatment regimen and denies muscle aches.  Hypertension: She takes metoprolol 25 mg daily, losartan 25 mg daily, and hydralazine 25 mg 2 times daily.  She denies headaches, dizziness, and blurry vision.  She reports adherence with treatment regiment.  The patient currently resides at a facility  Type 2 diabetes: She is on NovoLog  sliding scale and she takes Levemir 4 units at bedtime.  She denies polyuria, polyphagia, polydipsia.  Past Medical History:  Diagnosis Date   Alzheimer disease ([place])    Chronic kidney disease    stage II   Closed intertrochanteric fracture of femur ([place]) 03/27/2018   Dementia ([place])    Diabetes mellitus    Grade I diastolic dysfunction 53/29/9242   Hyperlipidemia    Hypertension     Past Surgical History:  Procedure Laterality Date   ABDOMINAL HYSTERECTOMY     BACK SURGERY     INTRAMEDULLARY (IM) NAIL INTERTROCHANTERIC Left 03/28/2018   Procedure: INTRAMEDULLARY (IM) NAIL INTERTROCHANTRIC;  Surgeon: Carole Civil, MD;  Location: [facility];  Service: Orthopedics;  Laterality: Left;    Family History  Problem Relation Age of Onset   Hypertension Mother     Cancer Father        colon    Cancer Sister        Breast cancer   Osteoporosis Sister     Social History   Socioeconomic History   Marital status: Widowed    Spouse name: Not on file   Number of children: 4   Years of education: Not on file   Highest education level: Not on file  Occupational History   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   Social Determinants of Health   Financial Resource Strain: Low Risk  (07/07/2021)   Overall Financial Resource Strain (CARDIA)    Difficulty of Paying Living Expenses: Not hard at all  Food Insecurity: No Food Insecurity (07/07/2021)   Hunger Vital Sign    Worried About Running Out of Food in the Last Year: Never true    Ran Out of Food in the Last Year: Never true  Transportation Needs: No Transportation Needs (07/07/2021)   PRAPARE -  Hydrologist (Medical): No    Lack of Transportation (Non-Medical): No  Physical Activity: Sufficiently Active (07/07/2021)   Exercise Vital Sign    Days of Exercise per Week: 5 days    Minutes of Exercise per Session: 30 min  Stress: Not on file  Social Connections: Socially Isolated (07/07/2021)   Social Connection and Isolation Panel [NHANES]    Frequency of Communication with Friends and Family: Never    Frequency of Social Gatherings with Friends and Family: Twice a week    Attends Religious Services: Never    Marine scientist or Organizations: No    Attends Archivist Meetings: Never    Marital Status: Widowed  Human resources officer Violence:  Not on file    Outpatient Medications Prior to Visit  Medication Sig Dispense Refill   Alcohol Swabs (ALCOHOL PREP) PADS Use to check blood sugar daily 100 each 5   BD PEN NEEDLE NANO U/F 32G X 4 MM MISC USE AS DIRECTED TO INJECT NOVOLOG 3 TIMES A DAY AND LANTUS AT BEDTIME. 200 each 0   Blood Glucose Monitoring Suppl (BLOOD GLUCOSE SYSTEM PAK) KIT Use as instructed for 3x daily testing. Dx: E11.22. 1 kit 1   busPIRone (BUSPAR) 5 MG tablet TAKE 1 TABLET BY MOUTH TWICE DAILY. 60 tablet 2   docusate sodium (COLACE) 100 MG capsule Take 1 capsule (100 mg total) by mouth 2 (two) times daily. 60 capsule 2   ELIQUIS 2.5 MG TABS tablet TAKE 1 TABLET BY MOUTH TWICE DAILY. 60 tablet 2   GLOBAL EASE INJECT PEN NEEDLES 31G X 5 MM MISC Inject 1 Syringe into the skin 4 (four) times daily. 100 each 2   glucose blood (ONETOUCH VERIO) test strip USE TO TEST BLOOD SUGAR 3 TIMES A DAY. Dx e11.65 100 strip 5   hydrALAZINE (APRESOLINE) 25 MG tablet Take 1 tablet (25 mg total) by mouth 2 (two) times daily. 60 tablet 3   insulin aspart (NOVOLOG FLEXPEN) 100 UNIT/ML FlexPen Inject per sliding scale 3x daily with meals: 151-200 2U, 201-250 4U, 251- 300 6U, 301-350 8U, 351-400 10U, <60/>400 call MD 15 mL 11   insulin detemir (LEVEMIR FLEXTOUCH) 100 UNIT/ML FlexPen 6U QAM and 4U QHS 15 mL 11   Lancet Devices (ONETOUCH DELICA PLUS LANCING) MISC      Lancets (ONETOUCH DELICA PLUS [id number]) MISC USE TO TEST BLOOD SUGAR 3 TIMES DAILY 100 each 11   losartan (COZAAR) 25 MG tablet Take 1 tablet (25 mg total) by mouth daily. 30 tablet 1   metoprolol tartrate (LOPRESSOR) 25 MG tablet TAKE 1 TABLET BY MOUTH TWICE DAILY. 60 tablet 2   NAMZARIC 28-10 MG CP24 TAKE 1 CAPSULE BY MOUTH AT BEDTIME. 40 capsule 11   OYSTER SHELL CALCIUM PLUS D 500-5 MG-MCG TABS TAKE 1 TABLET EVERY DAY 60 tablet 11   rosuvastatin (CRESTOR) 20 MG tablet TAKE 1 TABLET BY MOUTH AT BEDTIME. 30 tablet 2   Vitamin D, Ergocalciferol, (DRISDOL) 1.25 MG (50000  UNIT) CAPS capsule Take 1 capsule (50,000 Units total) by mouth every 7 (seven) days. 4 capsule 5   mirtazapine (REMERON) 15 MG tablet TAKE 1/2 TABLET BY MOUTH AT BEDTIME. 20 tablet 11   traZODone (DESYREL) 100 MG tablet TAKE 1 TABLET BY MOUTH AT BEDTIME. 30 tablet 2   No facility-administered medications prior to visit.    Allergies  Allergen Reactions   Thiazide-Type Diuretics Other (See Comments)    AKI  ROS Review of Systems  Constitutional:  Negative for fatigue and fever.  Eyes:  Negative for visual disturbance.  Respiratory:  Negative for chest tightness and shortness of breath.   Cardiovascular:  Negative for chest pain and palpitations.  Endocrine: Negative for polydipsia, polyphagia and polyuria.  Neurological:  Negative for dizziness and headaches.      Objective:    Physical Exam HENT:     Head: Normocephalic.  Cardiovascular:     Rate and Rhythm: Normal rate and regular rhythm.     Pulses: Normal pulses.     Heart sounds: Normal heart sounds.  Pulmonary:     Effort: Pulmonary effort is normal.     Breath sounds: Normal breath sounds.  Neurological:     Mental Status: She is alert.     BP 132/84   Pulse 72   Ht 5' 0.5" (1.537 m)   Wt 146 lb 1.3 oz (66.3 kg)   SpO2 98%   BMI 28.06 kg/m  Wt Readings from Last 3 Encounters:  07/25/22 146 lb 1.3 oz (66.3 kg)  03/17/22 138 lb (62.6 kg)  11/30/21 139 lb (63 kg)    Lab Results  Component Value Date   TSH 0.423 01/11/2016   Lab Results  Component Value Date   WBC 6.0 03/15/2022   HGB 10.7 (L) 03/15/2022   HCT 33.1 (L) 03/15/2022   MCV 85.8 03/15/2022   PLT 194 03/15/2022   Lab Results  Component Value Date   NA 140 03/15/2022   K 4.0 03/15/2022   CO2 28 03/15/2022   GLUCOSE 299 (H) 03/15/2022   BUN 27 (H) 03/15/2022   CREATININE 1.43 (H) 03/15/2022   BILITOT 0.6 03/15/2022   ALKPHOS 71 03/15/2022   AST 24 03/15/2022   ALT 26 03/15/2022   PROT 6.6 03/15/2022   ALBUMIN 3.4 (L)  03/15/2022   CALCIUM 8.4 (L) 03/15/2022   ANIONGAP 6 03/15/2022   EGFR 37 (L) 12/13/2021   Lab Results  Component Value Date   CHOL 155 03/24/2022   Lab Results  Component Value Date   HDL 51 03/24/2022   Lab Results  Component Value Date   LDLCALC 86 03/24/2022   Lab Results  Component Value Date   TRIG 98 03/24/2022   Lab Results  Component Value Date   CHOLHDL 3.0 03/24/2022   Lab Results  Component Value Date   HGBA1C 7.2 (H) 03/24/2022      Assessment & Plan:   Problem List Items Addressed This Visit       Cardiovascular and Mediastinum   Essential hypertension, benign    Controlled She takes metoprolol 25 mg daily, losartan 25 mg daily, and  hydralazine 25 mg 2 times daily Encouraged to continue treatment regimen No changes to treatment regimen today BP Readings from Last 3 Encounters:  07/25/22 132/84  03/17/22 (!) 192/80  12/13/21 (!) 199/71         Relevant Orders   CBC with Differential/Platelet   CMP14+EGFR     Endocrine   Type II diabetes mellitus with renal manifestations ([place])    Will get labs today to assess her hemoglobin A1c She denies polyuria, polyphagia, polydipsia Encouraged to continue on treatment regimen She takes Levemir 4 units at bedtime and is currently on NovoLog sliding scale Lab Results  Component Value Date   HGBA1C 7.2 (H) 03/24/2022        Relevant Orders   Hemoglobin A1C     Other   Hyperlipidemia - Primary  She takes rosuvastatin 20 mg daily Will assess lipid panel today Lab Results  Component Value Date   CHOL 155 03/24/2022   HDL 51 03/24/2022   LDLCALC 86 03/24/2022   TRIG 98 03/24/2022   CHOLHDL 3.0 03/24/2022        Relevant Orders   Lipid Profile   Chronic insomnia    Encouraged to take trazodone nightly as needed Denies suicidal thoughts and suicidal ideation      Relevant Medications   traZODone (DESYREL) 100 MG tablet   Chronic anemia    Will check CBC and CMP today as the  patient has been increasingly sleepy       Other Visit Diagnoses     Flu vaccine need       Relevant Orders   Flu Vaccine QUAD High Dose(Fluad) (Completed)   Vitamin D deficiency       Relevant Orders   Vitamin D (25 hydroxy)   Other specified hypothyroidism       Relevant Orders   TSH + free T4       Meds ordered this encounter  Medications   traZODone (DESYREL) 100 MG tablet    Sig: Take 1 tablet (100 mg total) by mouth at bedtime as needed for sleep.    Dispense:  90 tablet    Refill:  0    Follow-up: Return in about 4 months (around 11/25/2022).    Alvira Monday, FNP

## 2022-07-25 NOTE — Assessment & Plan Note (Signed)
Controlled She takes metoprolol 25 mg daily, losartan 25 mg daily, and  hydralazine 25 mg 2 times daily Encouraged to continue treatment regimen No changes to treatment regimen today BP Readings from Last 3 Encounters:  07/25/22 132/84  03/17/22 (!) 192/80  12/13/21 (!) 199/71

## 2022-07-25 NOTE — Assessment & Plan Note (Signed)
Will get labs today to assess her hemoglobin A1c She denies polyuria, polyphagia, polydipsia Encouraged to continue on treatment regimen She takes Levemir 4 units at bedtime and is currently on NovoLog sliding scale Lab Results  Component Value Date   HGBA1C 7.2 (H) 03/24/2022

## 2022-07-26 LAB — CMP14+EGFR
ALT: 22 IU/L (ref 0–32)
AST: 22 IU/L (ref 0–40)
Albumin/Globulin Ratio: 1.4 (ref 1.2–2.2)
Albumin: 3.9 g/dL (ref 3.7–4.7)
Alkaline Phosphatase: 92 IU/L (ref 44–121)
BUN/Creatinine Ratio: 14 (ref 12–28)
BUN: 18 mg/dL (ref 8–27)
Bilirubin Total: <0.2 mg/dL (ref 0.0–1.2)
CO2: 28 mmol/L (ref 20–29)
Calcium: 8.9 mg/dL (ref 8.7–10.3)
Chloride: 104 mmol/L (ref 96–106)
Creatinine, Ser: 1.26 mg/dL — ABNORMAL HIGH (ref 0.57–1.00)
Globulin, Total: 2.7 g/dL (ref 1.5–4.5)
Glucose: 141 mg/dL — ABNORMAL HIGH (ref 70–99)
Potassium: 4.2 mmol/L (ref 3.5–5.2)
Sodium: 142 mmol/L (ref 134–144)
Total Protein: 6.6 g/dL (ref 6.0–8.5)
eGFR: 41 mL/min/{1.73_m2} — ABNORMAL LOW (ref >59)

## 2022-07-26 LAB — LIPID PANEL
Chol/HDL Ratio: 2.7 ratio (ref 0.0–4.4)
Cholesterol, Total: 161 mg/dL (ref 100–199)
HDL: 59 mg/dL (ref >39)
LDL Chol Calc (NIH): 82 mg/dL (ref 0–99)
Triglycerides: 109 mg/dL (ref 0–149)
VLDL Cholesterol Cal: 20 mg/dL (ref 5–40)

## 2022-07-26 LAB — CBC WITH DIFFERENTIAL/PLATELET
Basophils Absolute: 0 10*3/uL (ref 0.0–0.2)
Basos: 0 %
EOS (ABSOLUTE): 0 10*3/uL (ref 0.0–0.4)
Eos: 1 %
Hematocrit: 31.6 % — ABNORMAL LOW (ref 34.0–46.6)
Hemoglobin: 10.2 g/dL — ABNORMAL LOW (ref 11.1–15.9)
Immature Grans (Abs): 0 10*3/uL (ref 0.0–0.1)
Immature Granulocytes: 0 %
Lymphocytes Absolute: 1.6 10*3/uL (ref 0.7–3.1)
Lymphs: 25 %
MCH: 26.6 pg (ref 26.6–33.0)
MCHC: 32.3 g/dL (ref 31.5–35.7)
MCV: 82 fL (ref 79–97)
Monocytes Absolute: 0.7 10*3/uL (ref 0.1–0.9)
Monocytes: 11 %
Neutrophils Absolute: 4 10*3/uL (ref 1.4–7.0)
Neutrophils: 63 %
Platelets: 207 10*3/uL (ref 150–450)
RBC: 3.84 x10E6/uL (ref 3.77–5.28)
RDW: 13.1 % (ref 11.7–15.4)
WBC: 6.3 10*3/uL (ref 3.4–10.8)

## 2022-07-26 LAB — HEMOGLOBIN A1C
Est. average glucose Bld gHb Est-mCnc: 169 mg/dL
Hgb A1c MFr Bld: 7.5 % — ABNORMAL HIGH (ref 4.8–5.6)

## 2022-07-26 LAB — VITAMIN D 25 HYDROXY (VIT D DEFICIENCY, FRACTURES): Vit D, 25-Hydroxy: 61 ng/mL (ref 30.0–100.0)

## 2022-07-26 LAB — TSH+FREE T4
Free T4: 1.28 ng/dL (ref 0.82–1.77)
TSH: 0.83 u[IU]/mL (ref 0.450–4.500)

## 2022-07-26 NOTE — Progress Notes (Signed)
Please encouraged the patient to increase her intake of iron rich foods just as meats, eggs, beans, spinach, beets, raisins, whole oats, and whole-grain breads.  Inform the patient that she is prediabetic and to continue on her medication regimen.  All other labs are stable.

## 2022-07-29 ENCOUNTER — Other Ambulatory Visit: Payer: Self-pay

## 2022-07-29 ENCOUNTER — Telehealth: Payer: Self-pay

## 2022-07-29 DIAGNOSIS — I10 Essential (primary) hypertension: Secondary | ICD-10-CM

## 2022-07-29 MED ORDER — APIXABAN 2.5 MG PO TABS: 2.5000 mg | ORAL_TABLET | Freq: Two times a day (BID) | ORAL | 2 refills | Status: DC

## 2022-07-29 MED ORDER — BUSPIRONE HCL 5 MG PO TABS: 5.0000 mg | ORAL_TABLET | Freq: Two times a day (BID) | ORAL | 2 refills | Status: DC

## 2022-07-29 MED ORDER — LOSARTAN POTASSIUM 25 MG PO TABS: 25.0000 mg | ORAL_TABLET | Freq: Every day | ORAL | 2 refills | Status: DC

## 2022-07-29 MED ORDER — ROSUVASTATIN CALCIUM 20 MG PO TABS: 20.0000 mg | ORAL_TABLET | Freq: Every day | ORAL | 2 refills | Status: DC

## 2022-07-29 MED ORDER — METOPROLOL TARTRATE 25 MG PO TABS: 25.0000 mg | ORAL_TABLET | Freq: Two times a day (BID) | ORAL | 2 refills | Status: DC

## 2022-07-29 NOTE — Telephone Encounter (Signed)
Meds refills sent except Namzaric. Looks like that one hasn't been filled yet from this office. Want to refill?

## 2022-07-29 NOTE — Telephone Encounter (Signed)
[name] called patient need med refills  busPIRone (BUSPAR) 5 MG tablet [[id number]   ELIQUIS 2.5 MG TABS tablet [[id number]  losartan (COZAAR) 25 MG tablet [[id number]]   metoprolol tartrate (LOPRESSOR) 25 MG tablet  NAMZARIC 28-10 MG CP24 [[id number]]   rosuvastatin (CRESTOR) 20 MG tablet [[id number]  ]     Pharmacy: [facility]

## 2022-07-29 NOTE — Telephone Encounter (Signed)
No, she hasn't been on  Namzaric  for a while

## 2022-08-01 ENCOUNTER — Other Ambulatory Visit: Payer: Self-pay

## 2022-08-01 ENCOUNTER — Telehealth: Payer: Self-pay

## 2022-08-01 ENCOUNTER — Other Ambulatory Visit: Payer: Self-pay | Admitting: Family Medicine

## 2022-08-01 DIAGNOSIS — I10 Essential (primary) hypertension: Secondary | ICD-10-CM

## 2022-08-01 DIAGNOSIS — F5104 Psychophysiologic insomnia: Secondary | ICD-10-CM

## 2022-08-01 MED ORDER — LOSARTAN POTASSIUM 25 MG PO TABS: 25.0000 mg | ORAL_TABLET | Freq: Every day | ORAL | 2 refills | Status: DC

## 2022-08-01 MED ORDER — TRAZODONE HCL 100 MG PO TABS: 100.0000 mg | ORAL_TABLET | Freq: Every evening | ORAL | 0 refills | Status: DC | PRN

## 2022-08-01 MED ORDER — METOPROLOL TARTRATE 25 MG PO TABS: 25.0000 mg | ORAL_TABLET | Freq: Two times a day (BID) | ORAL | 2 refills | Status: DC

## 2022-08-01 NOTE — Telephone Encounter (Signed)
Meds refills (trazodone does say PRN ) and d/c order sent for namzaric per Betty [name]

## 2022-08-01 NOTE — Telephone Encounter (Signed)
Betty [name] (admin) called from [facility] need med refill   losartan (COZAAR) 25 MG tablet   metoprolol tartrate (LOPRESSOR) 25 MG tablet [[id number]   traZODone (DESYREL) 100 MG tablet  needs to change to PRN Needs a new prescription that says PRN , the one they they have is one every night.  Needs new RX.      NAMZARIC 28-10 MG CP24  (need a discharge on this medicine if not taking - needs a DCR )    Pharmacy: [facility]

## 2022-08-08 ENCOUNTER — Telehealth: Payer: Self-pay | Admitting: Family Medicine

## 2022-08-08 NOTE — Telephone Encounter (Signed)
Noted as needed in chart, advised to call pharmacy to check on this to have medication updated.

## 2022-08-08 NOTE — Telephone Encounter (Signed)
[address], [place], [place] stating pt medication was supposed to be PRN not everyday. Can you please call her on this? States if she is unable to answer to please speak with Levada Dy.    traZODone (DESYREL) 100 MG tablet

## 2022-08-11 ENCOUNTER — Telehealth: Payer: Self-pay | Admitting: Family Medicine

## 2022-08-11 ENCOUNTER — Other Ambulatory Visit: Payer: Self-pay

## 2022-08-11 DIAGNOSIS — E1122 Type 2 diabetes mellitus with diabetic chronic kidney disease: Secondary | ICD-10-CM

## 2022-08-11 MED ORDER — ONETOUCH VERIO VI STRP: ORAL_STRIP | 5 refills | Status: DC

## 2022-08-11 NOTE — Telephone Encounter (Signed)
Rx sent 

## 2022-08-11 NOTE — Telephone Encounter (Signed)
Patient needs refill on   glucose blood (ONETOUCH VERIO) test strip   [place], [place] - [address], [place] [address] Phone: [phone]  Fax: [phone]

## 2022-08-12 ENCOUNTER — Telehealth: Payer: Self-pay | Admitting: Family Medicine

## 2022-08-12 NOTE — Telephone Encounter (Signed)
Refills sent yesterday

## 2022-08-12 NOTE — Telephone Encounter (Signed)
[address] [place], [place]     Prescription Request  08/12/2022  Is this a "Controlled Substance" medicine? No  LOV: 08/11/2022   What is the name of the medication or equipment? [place] TEST STRIPS  Have you contacted your pharmacy to request a refill? No   Which pharmacy would you like this sent to?  [place], [place] [address] [place] [address] Phone: [phone] Fax: [phone]   Patient notified that their request is being sent to the clinical staff for review and that they should receive a response within 2 business days.   Please advise at [phone]

## 2022-08-15 DIAGNOSIS — E1142 Type 2 diabetes mellitus with diabetic polyneuropathy: Secondary | ICD-10-CM | POA: Diagnosis not present

## 2022-08-15 DIAGNOSIS — B351 Tinea unguium: Secondary | ICD-10-CM | POA: Diagnosis not present

## 2022-08-15 DIAGNOSIS — E1151 Type 2 diabetes mellitus with diabetic peripheral angiopathy without gangrene: Secondary | ICD-10-CM | POA: Diagnosis not present

## 2022-08-18 ENCOUNTER — Other Ambulatory Visit: Payer: Self-pay | Admitting: Family Medicine

## 2022-08-22 ENCOUNTER — Other Ambulatory Visit: Payer: Self-pay | Admitting: Family Medicine

## 2022-08-29 ENCOUNTER — Telehealth: Payer: Self-pay | Admitting: Family Medicine

## 2022-08-29 NOTE — Telephone Encounter (Signed)
Patient need refill on     hydrALAZINE (APRESOLINE) 25 MG tablet

## 2022-08-30 ENCOUNTER — Other Ambulatory Visit: Payer: Self-pay

## 2022-08-30 DIAGNOSIS — I10 Essential (primary) hypertension: Secondary | ICD-10-CM

## 2022-08-30 MED ORDER — HYDRALAZINE HCL 25 MG PO TABS: 25.0000 mg | ORAL_TABLET | Freq: Two times a day (BID) | ORAL | 3 refills | Status: DC

## 2022-08-30 NOTE — Telephone Encounter (Signed)
kindly refill

## 2022-08-30 NOTE — Telephone Encounter (Signed)
error 

## 2022-08-30 NOTE — Telephone Encounter (Signed)
Refill sent.

## 2022-08-31 ENCOUNTER — Telehealth: Payer: Self-pay | Admitting: Family Medicine

## 2022-08-31 NOTE — Telephone Encounter (Signed)
Betty [name], [phone]  Pt is acting like she can't walk, combative, and won't eat. States this has been going on for about 2 days.

## 2022-08-31 NOTE — Telephone Encounter (Signed)
Forwarding to provider to review.

## 2022-09-01 ENCOUNTER — Other Ambulatory Visit: Payer: Self-pay

## 2022-09-01 ENCOUNTER — Telehealth: Payer: Self-pay | Admitting: Family Medicine

## 2022-09-01 DIAGNOSIS — F5104 Psychophysiologic insomnia: Secondary | ICD-10-CM

## 2022-09-01 MED ORDER — TRAZODONE HCL 100 MG PO TABS: 100.0000 mg | ORAL_TABLET | Freq: Every evening | ORAL | 0 refills | Status: DC | PRN

## 2022-09-01 NOTE — Telephone Encounter (Signed)
Patient needs refill on   traZODone (DESYREL) 100 MG tablet

## 2022-09-01 NOTE — Telephone Encounter (Signed)
Refill sent.

## 2022-09-02 ENCOUNTER — Other Ambulatory Visit: Payer: Self-pay | Admitting: Family Medicine

## 2022-09-02 DIAGNOSIS — F5104 Psychophysiologic insomnia: Secondary | ICD-10-CM

## 2022-09-02 NOTE — Telephone Encounter (Signed)
Called [facility] and left message for Daphnia to return my call

## 2022-09-02 NOTE — Telephone Encounter (Signed)
Please inquire if the patient has been tested for a urinary infection; if not, I would like her to be tested.  I tried calling the number provided, but I need help getting through.  She does have a history of dementia; please inquire if she has been compliant with her medication regimen.

## 2022-09-18 DIAGNOSIS — E119 Type 2 diabetes mellitus without complications: Secondary | ICD-10-CM | POA: Diagnosis not present

## 2022-09-18 DIAGNOSIS — Z743 Need for continuous supervision: Secondary | ICD-10-CM | POA: Diagnosis not present

## 2022-09-18 DIAGNOSIS — I10 Essential (primary) hypertension: Secondary | ICD-10-CM | POA: Diagnosis not present

## 2022-09-18 DIAGNOSIS — K5641 Fecal impaction: Secondary | ICD-10-CM | POA: Diagnosis not present

## 2022-09-18 DIAGNOSIS — R410 Disorientation, unspecified: Secondary | ICD-10-CM | POA: Diagnosis not present

## 2022-09-18 DIAGNOSIS — R739 Hyperglycemia, unspecified: Secondary | ICD-10-CM | POA: Diagnosis not present

## 2022-09-18 DIAGNOSIS — R102 Pelvic and perineal pain: Secondary | ICD-10-CM | POA: Diagnosis not present

## 2022-09-18 DIAGNOSIS — R300 Dysuria: Secondary | ICD-10-CM | POA: Diagnosis not present

## 2022-10-12 ENCOUNTER — Other Ambulatory Visit: Payer: Self-pay | Admitting: Family Medicine

## 2022-10-12 DIAGNOSIS — I10 Essential (primary) hypertension: Secondary | ICD-10-CM

## 2022-11-10 ENCOUNTER — Telehealth: Payer: Self-pay | Admitting: Family Medicine

## 2022-11-10 ENCOUNTER — Other Ambulatory Visit: Payer: Self-pay

## 2022-11-10 MED ORDER — DOCUSATE SODIUM 100 MG PO CAPS: 100.0000 mg | ORAL_CAPSULE | Freq: Two times a day (BID) | ORAL | 2 refills | Status: DC

## 2022-11-10 NOTE — Telephone Encounter (Signed)
Nurse called from [facility] [phone]) need refill on docusate sodium (COLACE) 100 MG capsule [   Pharmacy  [place], [place] [address], [place] [address] Phone: [phone]  Fax: [phone] DEA #: --

## 2022-11-10 NOTE — Telephone Encounter (Signed)
Refill sent.

## 2022-11-11 ENCOUNTER — Other Ambulatory Visit: Payer: Self-pay | Admitting: Family Medicine

## 2022-11-11 ENCOUNTER — Telehealth: Payer: Self-pay | Admitting: Family Medicine

## 2022-11-11 DIAGNOSIS — Z0279 Encounter for issue of other medical certificate: Secondary | ICD-10-CM

## 2022-11-11 NOTE — Telephone Encounter (Signed)
Called patient son Cherylann Banas he will fill out his part and will fax to [phone].  [name] for [employer] to help with mom  Copied Noted Sleeved

## 2022-11-11 NOTE — Telephone Encounter (Signed)
Patient son picked up forms and faxed over

## 2022-11-25 ENCOUNTER — Other Ambulatory Visit: Payer: Self-pay | Admitting: Family Medicine

## 2022-11-25 ENCOUNTER — Ambulatory Visit: Payer: Medicare HMO | Admitting: Family Medicine

## 2022-11-25 ENCOUNTER — Telehealth: Payer: Self-pay | Admitting: Family Medicine

## 2022-11-25 NOTE — Telephone Encounter (Signed)
Last hga1c was checked on 07/25/22 and this medication has not been refilled since 08/26/20. Kindly ask the patient to schedule an office visit.

## 2022-11-25 NOTE — Telephone Encounter (Signed)
[name]  For son Rachiel Seratt to care for mother  Copied Noted Sleeved  Call Shareefah Schuerger (son) when forms are ready [phone].

## 2022-11-25 NOTE — Telephone Encounter (Signed)
Need med refill  insulin detemir (LEVEMIR FLEXTOUCH) 100 UNIT/ML FlexPen [id number]   Pharmacy: [facility]

## 2022-11-28 NOTE — Telephone Encounter (Signed)
Pt scheduled on 12/07/22

## 2022-11-30 ENCOUNTER — Other Ambulatory Visit: Payer: Self-pay

## 2022-11-30 ENCOUNTER — Telehealth: Payer: Self-pay | Admitting: Family Medicine

## 2022-11-30 DIAGNOSIS — F419 Anxiety disorder, unspecified: Secondary | ICD-10-CM

## 2022-11-30 DIAGNOSIS — E1122 Type 2 diabetes mellitus with diabetic chronic kidney disease: Secondary | ICD-10-CM

## 2022-11-30 MED ORDER — LEVEMIR FLEXTOUCH 100 UNIT/ML ~~LOC~~ SOPN: PEN_INJECTOR | SUBCUTANEOUS | 11 refills | Status: DC

## 2022-11-30 MED ORDER — BUSPIRONE HCL 5 MG PO TABS: 5.0000 mg | ORAL_TABLET | Freq: Two times a day (BID) | ORAL | 2 refills | Status: DC

## 2022-11-30 NOTE — Telephone Encounter (Signed)
PT IS COMPLETELY OUT. CAN YOU PLEASE REFILL?    Betty [name], [place]

## 2022-11-30 NOTE — Telephone Encounter (Signed)
Prescription Request  11/30/2022   LOV: 07/25/2022  What is the name of the medication or equipment? insulin detemir (LEVEMIR FLEXTOUCH) 100 UNIT/ML FlexPen   Have you contacted your pharmacy to request a refill? No   Which pharmacy would you like this sent to?  [place], [place] [address] [place] [address] Phone: [phone] Fax: [phone]    Patient notified that their request is being sent to the clinical staff for review and that they should receive a response within 2 business days.   Please advise at [facility] [phone]

## 2022-11-30 NOTE — Telephone Encounter (Signed)
[facility] know.

## 2022-12-07 ENCOUNTER — Ambulatory Visit: Payer: Medicare HMO | Admitting: Family Medicine

## 2022-12-14 ENCOUNTER — Other Ambulatory Visit: Payer: Self-pay | Admitting: Family Medicine

## 2022-12-14 DIAGNOSIS — I10 Essential (primary) hypertension: Secondary | ICD-10-CM

## 2022-12-15 ENCOUNTER — Other Ambulatory Visit: Payer: Self-pay | Admitting: Family Medicine

## 2022-12-16 ENCOUNTER — Other Ambulatory Visit: Payer: Self-pay | Admitting: Family Medicine

## 2022-12-19 ENCOUNTER — Encounter: Payer: Self-pay | Admitting: Family Medicine

## 2022-12-19 ENCOUNTER — Ambulatory Visit (INDEPENDENT_AMBULATORY_CARE_PROVIDER_SITE_OTHER): Payer: Medicare HMO | Admitting: Family Medicine

## 2022-12-19 VITALS — BP 132/72 | HR 68 | Wt 136.0 lb

## 2022-12-19 DIAGNOSIS — Z794 Long term (current) use of insulin: Secondary | ICD-10-CM | POA: Diagnosis not present

## 2022-12-19 DIAGNOSIS — E1122 Type 2 diabetes mellitus with diabetic chronic kidney disease: Secondary | ICD-10-CM | POA: Diagnosis not present

## 2022-12-19 DIAGNOSIS — E7849 Other hyperlipidemia: Secondary | ICD-10-CM | POA: Diagnosis not present

## 2022-12-19 DIAGNOSIS — I10 Essential (primary) hypertension: Secondary | ICD-10-CM | POA: Diagnosis not present

## 2022-12-19 NOTE — Assessment & Plan Note (Signed)
Controlled She takes metoprolol 25 mg daily, losartan 25 mg daily, and  hydralazine 25 mg 2 times daily Encouraged to continue treatment regimen No changes to treatment regimen today BP Readings from Last 3 Encounters:  12/19/22 132/72  07/25/22 132/84  03/17/22 (!) 192/80

## 2022-12-19 NOTE — Progress Notes (Signed)
Established Patient Office Visit  Subjective:  Patient ID: Betty [name], female    DOB: 28-Jul-1931  Age: 89 y.o. MRN: [id number]  CC:  Chief Complaint  Patient presents with   Hypertension   Diabetes   Hyperlipidemia    Patient is here for 5 month chronic f/u.     HPI Betty [name] is a 89 y.o. female with past medical history of dementia, essential hypertension, type 2 diabetes, chronic kidney disease presents for f/u of  chronic medical conditions. For the details of today's visit, please refer to the assessment and plan.  The patient is seen today in the clinic with her son and daughter who are the primary historian.     Past Medical History:  Diagnosis Date   Alzheimer disease ([place])    Chronic kidney disease    stage II   Closed intertrochanteric fracture of femur ([place]) 03/27/2018   Dementia ([place])    Diabetes mellitus    Grade I diastolic dysfunction [id number]   Hyperlipidemia    Hypertension     Past Surgical History:  Procedure Laterality Date   ABDOMINAL HYSTERECTOMY     BACK SURGERY     INTRAMEDULLARY (IM) NAIL INTERTROCHANTERIC Left 03/28/2018   Procedure: INTRAMEDULLARY (IM) NAIL INTERTROCHANTRIC;  Surgeon: Carole Civil, MD;  Location: [facility];  Service: Orthopedics;  Laterality: Left;    Family History  Problem Relation Age of Onset   Hypertension Mother    Cancer Father        colon    Cancer Sister        Breast cancer   Osteoporosis Sister     Social History   Socioeconomic History   Marital status: Widowed    Spouse name: Not on file   Number of children: 4   Years of education: Not on file   Highest education level: Not on file  Occupational History   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   Social Determinants of Health   Financial Resource Strain: Low Risk  (07/07/2021)   Overall Financial Resource Strain (CARDIA)    Difficulty of Paying Living Expenses: Not hard at all  Food Insecurity: No Food Insecurity (07/07/2021)   Hunger Vital Sign    Worried About Running Out of Food in the Last Year: Never true    Ran Out of Food in the Last Year: Never true  Transportation Needs: No Transportation Needs (07/07/2021)   PRAPARE - Hydrologist (Medical): No    Lack of Transportation (Non-Medical): No  Physical Activity: Sufficiently Active (07/07/2021)   Exercise Vital Sign    Days of Exercise per Week: 5 days    Minutes of Exercise per Session: 30 min  Stress: Not on file  Social Connections: Socially Isolated (07/07/2021)   Social Connection and Isolation Panel [NHANES]    Frequency of Communication with Friends and Family: Never    Frequency of Social Gatherings with Friends and Family: Twice a week  Attends Religious Services: Never    Active Member of Clubs or Organizations: No    Attends Archivist Meetings: Never    Marital Status: Widowed  Human resources officer Violence: Not on file    Outpatient Medications Prior to Visit  Medication Sig Dispense Refill   Alcohol Swabs (ALCOHOL PREP) PADS Use to check blood sugar daily 100 each 5   Blood Glucose Monitoring Suppl (BLOOD GLUCOSE SYSTEM PAK) KIT Use as instructed for 3x daily testing. Dx: E11.22. 1 kit 1   busPIRone (BUSPAR) 5 MG tablet Take 1 tablet (5 mg total) by mouth 2 (two) times daily. 60 tablet 2   divalproex (DEPAKOTE) 125 MG DR tablet Take 125 mg by mouth 2 (two) times daily.     docusate sodium (COLACE) 100 MG capsule Take 1 capsule (100 mg total) by mouth 2 (two) times daily. 60  capsule 2   ELIQUIS 2.5 MG TABS tablet TAKE 1 TABLET BY MOUTH TWICE DAILY. 60 tablet 11   glucose blood (ONETOUCH VERIO) test strip USE TO TEST BLOOD SUGAR 3 TIMES A DAY. Dx e11.65 100 strip 5   hydrALAZINE (APRESOLINE) 25 MG tablet TAKE 1 TABLET BY MOUTH TWICE DAILY. 60 tablet 11   insulin aspart (NOVOLOG FLEXPEN) 100 UNIT/ML FlexPen Inject per sliding scale 3x daily with meals: 151-200 2U, 201-250 4U, 251- 300 6U, 301-350 8U, 351-400 10U, <60/>400 call MD 15 mL 11   insulin detemir (LEVEMIR FLEXTOUCH) 100 UNIT/ML FlexPen 6U QAM and 4U QHS 15 mL 11   Insulin Pen Needle (BD PEN NEEDLE NANO U/F) 32G X 4 MM MISC USE AS DIRECTED TO INJECT NOVOLOG 3 TIMES A DAY AND LANTUS AT BEDTIME. 200 each 0   Lancet Devices (ONETOUCH DELICA PLUS LANCING) MISC      Lancets (ONETOUCH DELICA PLUS 123XX123) MISC USE TO TEST BLOOD SUGAR 3 TIMES DAILY 100 each 11   losartan (COZAAR) 25 MG tablet TAKE 1 TABLET BY MOUTH ONCE DAILY. 30 tablet 11   metoprolol tartrate (LOPRESSOR) 25 MG tablet TAKE 1 TABLET BY MOUTH TWICE DAILY. 60 tablet 11   mirtazapine (REMERON) 15 MG tablet Take by mouth.     NAMZARIC 28-10 MG CP24 TAKE 1 CAPSULE BY MOUTH AT BEDTIME. 40 capsule 11   OYSTER SHELL CALCIUM PLUS D 500-5 MG-MCG TABS TAKE 1 TABLET EVERY DAY 60 tablet 11   rosuvastatin (CRESTOR) 20 MG tablet TAKE 1 TABLET BY MOUTH AT BEDTIME. 30 tablet 11   SURE COMFORT PEN NEEDLES 31G X 5 MM MISC USE WITH NOVOLOG AND LANTUS 4 TIMES DAILY. 100 each 0   traZODone (DESYREL) 100 MG tablet Take 1 tablet (100 mg total) by mouth at bedtime as needed for sleep. 90 tablet 0   No facility-administered medications prior to visit.    Allergies  Allergen Reactions   Thiazide-Type Diuretics Other (See Comments)    AKI    ROS Review of Systems  Constitutional:  Negative for chills and fever.  Eyes:  Negative for visual disturbance.  Respiratory:  Negative for chest tightness and shortness of breath.   Neurological:  Negative for dizziness and  headaches.      Objective:    Physical Exam HENT:     Head: Normocephalic.     Mouth/Throat:     Mouth: Mucous membranes are moist.  Cardiovascular:     Rate and Rhythm: Normal rate.     Heart sounds: Normal heart sounds.  Pulmonary:     Effort: Pulmonary effort is normal.  Breath sounds: Normal breath sounds.  Neurological:     Mental Status: She is alert.     BP 132/72   Pulse 68   Wt 136 lb (61.7 kg)   SpO2 97%   BMI 26.12 kg/m  Wt Readings from Last 3 Encounters:  12/19/22 136 lb (61.7 kg)  07/25/22 146 lb 1.3 oz (66.3 kg)  03/17/22 138 lb (62.6 kg)    Lab Results  Component Value Date   TSH 0.830 07/25/2022   Lab Results  Component Value Date   WBC 6.3 07/25/2022   HGB 10.2 (L) 07/25/2022   HCT 31.6 (L) 07/25/2022   MCV 82 07/25/2022   PLT 207 07/25/2022   Lab Results  Component Value Date   NA 142 07/25/2022   K 4.2 07/25/2022   CO2 28 07/25/2022   GLUCOSE 141 (H) 07/25/2022   BUN 18 07/25/2022   CREATININE 1.26 (H) 07/25/2022   BILITOT <0.2 07/25/2022   ALKPHOS 92 07/25/2022   AST 22 07/25/2022   ALT 22 07/25/2022   PROT 6.6 07/25/2022   ALBUMIN 3.9 07/25/2022   CALCIUM 8.9 07/25/2022   ANIONGAP 6 03/15/2022   EGFR 41 (L) 07/25/2022   Lab Results  Component Value Date   CHOL 161 07/25/2022   Lab Results  Component Value Date   HDL 59 07/25/2022   Lab Results  Component Value Date   LDLCALC 82 07/25/2022   Lab Results  Component Value Date   TRIG 109 07/25/2022   Lab Results  Component Value Date   CHOLHDL 2.7 07/25/2022   Lab Results  Component Value Date   HGBA1C 7.5 (H) 07/25/2022      Assessment & Plan:  Essential hypertension, benign Assessment & Plan: Controlled She takes metoprolol 25 mg daily, losartan 25 mg daily, and  hydralazine 25 mg 2 times daily Encouraged to continue treatment regimen No changes to treatment regimen today BP Readings from Last 3 Encounters:  12/19/22 132/72  07/25/22 132/84   03/17/22 (!) 192/80      Type 2 diabetes mellitus with chronic kidney disease, with long-term current use of insulin, unspecified CKD stage ([place]) Assessment & Plan: Will get labs today to assess her hemoglobin A1c She denies polyuria, polyphagia, polydipsia Encouraged to continue on treatment regimen She takes Levemir 6 units at bedtime and is currently on NovoLog sliding scale Lab Results  Component Value Date   HGBA1C 7.5 (H) 07/25/2022     Other hyperlipidemia Assessment & Plan: She takes rosuvastatin 20 mg daily Will assess lipid panel today Lab Results  Component Value Date   CHOL 161 07/25/2022   HDL 59 07/25/2022   LDLCALC 82 07/25/2022   TRIG 109 07/25/2022   CHOLHDL 2.7 07/25/2022       Follow-up: Return in about 5 months (around 05/21/2023).   Alvira Monday, FNP

## 2022-12-19 NOTE — Assessment & Plan Note (Signed)
Will get labs today to assess her hemoglobin A1c She denies polyuria, polyphagia, polydipsia Encouraged to continue on treatment regimen She takes Levemir 6 units at bedtime and is currently on NovoLog sliding scale Lab Results  Component Value Date   HGBA1C 7.5 (H) 07/25/2022

## 2022-12-19 NOTE — Assessment & Plan Note (Signed)
She takes rosuvastatin 20 mg daily Will assess lipid panel today Lab Results  Component Value Date   CHOL 161 07/25/2022   HDL 59 07/25/2022   LDLCALC 82 07/25/2022   TRIG 109 07/25/2022   CHOLHDL 2.7 07/25/2022

## 2022-12-19 NOTE — Patient Instructions (Signed)
I appreciate the opportunity to provide care to you today!    Follow up:  5 months  Labs: please stop by the lab during the week to get your blood drawn (CBC, CMP, TSH, Lipid profile, HgA1c, Vit D)    Please continue to a heart-healthy diet and increase your physical activities. Try to exercise for 71mins at least five days a week.      It was a pleasure to see you and I look forward to continuing to work together on your health and well-being. Please do not hesitate to call the office if you need care or have questions about your care.   Have a wonderful day and week. With Gratitude, Alvira Monday MSN, FNP-BC

## 2022-12-26 DIAGNOSIS — I129 Hypertensive chronic kidney disease with stage 1 through stage 4 chronic kidney disease, or unspecified chronic kidney disease: Secondary | ICD-10-CM | POA: Diagnosis not present

## 2022-12-26 DIAGNOSIS — G309 Alzheimer's disease, unspecified: Secondary | ICD-10-CM | POA: Diagnosis not present

## 2022-12-26 DIAGNOSIS — R69 Illness, unspecified: Secondary | ICD-10-CM | POA: Diagnosis not present

## 2022-12-26 DIAGNOSIS — N184 Chronic kidney disease, stage 4 (severe): Secondary | ICD-10-CM | POA: Diagnosis not present

## 2022-12-29 ENCOUNTER — Other Ambulatory Visit: Payer: Self-pay | Admitting: Family Medicine

## 2022-12-29 ENCOUNTER — Telehealth: Payer: Self-pay | Admitting: Family Medicine

## 2022-12-29 NOTE — Telephone Encounter (Signed)
Caregiver/administration from [facility] states in house PCP started her on it for behavioral issues. If any questions her call back number for Mrs. Betty [name] [phone] .

## 2022-12-29 NOTE — Telephone Encounter (Signed)
Betty [name], Betty [name] stating pt is needing an urgent refill on mirtazapine (REMERON) 15 MG tablet .    Laynes Phar

## 2022-12-29 NOTE — Telephone Encounter (Signed)
Hasn't been filled by you before, ok to fill?

## 2022-12-29 NOTE — Telephone Encounter (Signed)
Kindly verify why the patient is taking remeron

## 2022-12-30 ENCOUNTER — Other Ambulatory Visit: Payer: Self-pay | Admitting: Family Medicine

## 2022-12-30 DIAGNOSIS — R634 Abnormal weight loss: Secondary | ICD-10-CM

## 2022-12-30 DIAGNOSIS — F419 Anxiety disorder, unspecified: Secondary | ICD-10-CM

## 2022-12-30 MED ORDER — MIRTAZAPINE 15 MG PO TABS: 15.0000 mg | ORAL_TABLET | Freq: Every day | ORAL | 1 refills | Status: DC

## 2023-01-09 ENCOUNTER — Other Ambulatory Visit: Payer: Self-pay | Admitting: Family Medicine

## 2023-01-09 DIAGNOSIS — F5104 Psychophysiologic insomnia: Secondary | ICD-10-CM

## 2023-01-10 ENCOUNTER — Telehealth: Payer: Self-pay | Admitting: Family Medicine

## 2023-01-10 NOTE — Telephone Encounter (Signed)
Physician Order form  Copied Noted Sleeved  Call [facility] when ready [phone].

## 2023-01-12 ENCOUNTER — Other Ambulatory Visit: Payer: Self-pay | Admitting: Family Medicine

## 2023-01-12 DIAGNOSIS — R634 Abnormal weight loss: Secondary | ICD-10-CM

## 2023-01-20 ENCOUNTER — Telehealth: Payer: Self-pay | Admitting: Family Medicine

## 2023-01-20 NOTE — Telephone Encounter (Signed)
[facility] called need med refills   Vitamin D 2  mirtazapine (REMERON) 15 MG tablet [[id number]]   divalproex (DEPAKOTE) 125 MG DR tablet [[id number]]   Pharmacy  [facility] [place] - [address], [place] [address] Phone: [phone]  Fax: [phone]

## 2023-01-21 ENCOUNTER — Other Ambulatory Visit: Payer: Self-pay | Admitting: Family Medicine

## 2023-01-24 ENCOUNTER — Other Ambulatory Visit: Payer: Self-pay | Admitting: Family Medicine

## 2023-01-24 DIAGNOSIS — R634 Abnormal weight loss: Secondary | ICD-10-CM

## 2023-01-24 NOTE — Telephone Encounter (Signed)
Please verify why the patient is on Depakote

## 2023-01-25 ENCOUNTER — Other Ambulatory Visit: Payer: Self-pay | Admitting: Family Medicine

## 2023-01-25 ENCOUNTER — Telehealth: Payer: Self-pay | Admitting: Family Medicine

## 2023-01-25 ENCOUNTER — Other Ambulatory Visit: Payer: Self-pay

## 2023-01-25 DIAGNOSIS — K5900 Constipation, unspecified: Secondary | ICD-10-CM

## 2023-01-25 MED ORDER — CALCIUM CARB-CHOLECALCIFEROL 500-5 MG-MCG PO TABS: 1.0000 | ORAL_TABLET | Freq: Every day | ORAL | 11 refills | Status: DC

## 2023-01-25 MED ORDER — DOCUSATE SODIUM 100 MG PO CAPS: 100.0000 mg | ORAL_CAPSULE | Freq: Two times a day (BID) | ORAL | 2 refills | Status: DC

## 2023-01-25 NOTE — Telephone Encounter (Signed)
Refills sent

## 2023-01-25 NOTE — Telephone Encounter (Signed)
[facility] called for med refill  mirtazapine (REMERON) 15 MG tablet [[id number]]   OYSTER SHELL CALCIUM PLUS D 500-5 MG-MCG TABS [[id number]]   [facility]

## 2023-01-25 NOTE — Telephone Encounter (Signed)
[facility] called for med refill  docusate sodium (COLACE) 100 MG capsule [[id number]]   Pharmacy: [facility]

## 2023-01-25 NOTE — Telephone Encounter (Signed)
[facility] unsure why pt is on this medication, she only knows when pt came to her she was already taking this.

## 2023-01-25 NOTE — Telephone Encounter (Signed)
Kindly provide me with the number to the nursing home

## 2023-01-25 NOTE — Telephone Encounter (Signed)
REFILL SENT 

## 2023-01-26 ENCOUNTER — Other Ambulatory Visit: Payer: Self-pay | Admitting: Family Medicine

## 2023-01-26 NOTE — Telephone Encounter (Signed)
[facility] aware forms ready for pick up

## 2023-01-27 ENCOUNTER — Telehealth: Payer: Self-pay | Admitting: Family Medicine

## 2023-01-27 NOTE — Telephone Encounter (Signed)
Phone number is [phone]

## 2023-01-27 NOTE — Telephone Encounter (Signed)
I called and inquired about the medication Depakote 125 mg daily and the patient's request for a refill; I was informed that the previous provider prescribed it. The staff will call me back after reviwing the patient's records. Per my record, the patient does not have a hx of Bipolar, epilepsy, and migraine headaches.

## 2023-02-01 ENCOUNTER — Other Ambulatory Visit: Payer: Self-pay | Admitting: Family Medicine

## 2023-02-08 ENCOUNTER — Other Ambulatory Visit: Payer: Self-pay | Admitting: Family Medicine

## 2023-02-08 DIAGNOSIS — R634 Abnormal weight loss: Secondary | ICD-10-CM

## 2023-02-10 ENCOUNTER — Other Ambulatory Visit: Payer: Self-pay | Admitting: Family Medicine

## 2023-02-10 DIAGNOSIS — R634 Abnormal weight loss: Secondary | ICD-10-CM

## 2023-02-13 ENCOUNTER — Other Ambulatory Visit: Payer: Self-pay | Admitting: Family Medicine

## 2023-02-13 ENCOUNTER — Telehealth: Payer: Self-pay | Admitting: Family Medicine

## 2023-02-13 ENCOUNTER — Other Ambulatory Visit: Payer: Self-pay

## 2023-02-13 DIAGNOSIS — R634 Abnormal weight loss: Secondary | ICD-10-CM

## 2023-02-13 MED ORDER — MIRTAZAPINE 15 MG PO TABS
15.0000 mg | ORAL_TABLET | Freq: Every day | ORAL | 1 refills | Status: DC
Start: 2023-02-13 — End: 2023-03-21

## 2023-02-13 NOTE — Telephone Encounter (Signed)
Refill for mirtazepine sent vitamin d looks stable from last lab.

## 2023-02-13 NOTE — Telephone Encounter (Signed)
I last spoke with the nursing home manager, who informed me that the previous provider prescribed Depakote, but they are unsure why the patient was on the medication. The patient has no reported hx of Epilepsy, bipolar and migraines. The manager informed me that she would get back to me to explain why the patient was on the medication, but she has not reached me since.

## 2023-02-13 NOTE — Telephone Encounter (Signed)
Called from [facility], [facility] said has sent over faxed has questions regarding this medication  divalproex (DEPAKOTE) 125 MG DR tablet [[id number]]   Pharmacy [facility]

## 2023-02-13 NOTE — Telephone Encounter (Signed)
Betty [name] called from [facility] pharmacy need med refills  mirtazapine (REMERON) 15 MG tablet [[id number]]   Vitamin D2 1.25 mg  (50,000 units)  Pharmacy: [facility]

## 2023-02-15 ENCOUNTER — Other Ambulatory Visit: Payer: Self-pay | Admitting: Family Medicine

## 2023-03-01 ENCOUNTER — Other Ambulatory Visit: Payer: Self-pay | Admitting: Family Medicine

## 2023-03-01 DIAGNOSIS — F419 Anxiety disorder, unspecified: Secondary | ICD-10-CM

## 2023-03-09 ENCOUNTER — Other Ambulatory Visit: Payer: Self-pay | Admitting: Family Medicine

## 2023-03-13 ENCOUNTER — Other Ambulatory Visit: Payer: Self-pay | Admitting: Family Medicine

## 2023-03-20 ENCOUNTER — Other Ambulatory Visit: Payer: Self-pay | Admitting: Family Medicine

## 2023-03-20 DIAGNOSIS — R634 Abnormal weight loss: Secondary | ICD-10-CM

## 2023-04-04 ENCOUNTER — Telehealth: Payer: Self-pay | Admitting: Family Medicine

## 2023-04-04 ENCOUNTER — Other Ambulatory Visit: Payer: Self-pay | Admitting: Family Medicine

## 2023-04-04 DIAGNOSIS — E1122 Type 2 diabetes mellitus with diabetic chronic kidney disease: Secondary | ICD-10-CM

## 2023-04-04 MED ORDER — NOVOLOG FLEXPEN 100 UNIT/ML ~~LOC~~ SOPN
PEN_INJECTOR | SUBCUTANEOUS | 0 refills | Status: DC
Start: 2023-04-04 — End: 2024-08-28

## 2023-04-04 NOTE — Telephone Encounter (Signed)
Please advice on this refill?

## 2023-04-04 NOTE — Telephone Encounter (Signed)
Refill sent. Encouraged to schedule an appointment to assess hgA1c before next refill

## 2023-04-04 NOTE — Telephone Encounter (Signed)
Prescription Request  04/04/2023  LOV: 12/19/2022  What is the name of the medication or equipment? insulin aspart (NOVOLOG FLEXPEN) 100 UNIT/ML FlexPen [[id number]]    Have you contacted your pharmacy to request a refill? Yes   Which pharmacy would you like this sent to?  [facility] [place], [place] - [address] [place] [address] Phone: [phone] Fax: [phone]    Patient notified that their request is being sent to the clinical staff for review and that they should receive a response within 2 business days.   Please advise at Mobile [phone] (mobile)

## 2023-04-17 ENCOUNTER — Other Ambulatory Visit: Payer: Self-pay | Admitting: Family Medicine

## 2023-04-17 DIAGNOSIS — K5900 Constipation, unspecified: Secondary | ICD-10-CM

## 2023-04-19 ENCOUNTER — Other Ambulatory Visit: Payer: Self-pay

## 2023-04-19 ENCOUNTER — Telehealth: Payer: Self-pay | Admitting: Family Medicine

## 2023-04-19 DIAGNOSIS — E1122 Type 2 diabetes mellitus with diabetic chronic kidney disease: Secondary | ICD-10-CM

## 2023-04-19 MED ORDER — LEVEMIR FLEXTOUCH 100 UNIT/ML ~~LOC~~ SOPN
PEN_INJECTOR | SUBCUTANEOUS | 11 refills | Status: DC
Start: 2023-04-19 — End: 2023-05-03

## 2023-04-19 NOTE — Telephone Encounter (Signed)
 Refill sent.

## 2023-04-19 NOTE — Telephone Encounter (Signed)
Prescription Request  04/19/2023  LOV: 12/19/2022  What is the name of the medication or equipment? insulin detemir (LEVEMIR FLEXTOUCH) 100 UNIT/ML FlexPen [[id number]   Have you contacted your pharmacy to request a refill? No   Which pharmacy would you like this sent to?  [facility] [place], [place] - [address] [place] [address] Phone: [phone] Fax: [phone]    Patient notified that their request is being sent to the clinical staff for review and that they should receive a response within 2 business days.   Please advise at Mobile [phone] (mobile)

## 2023-05-01 ENCOUNTER — Other Ambulatory Visit: Payer: Self-pay | Admitting: Family Medicine

## 2023-05-03 ENCOUNTER — Other Ambulatory Visit: Payer: Self-pay

## 2023-05-03 ENCOUNTER — Telehealth: Payer: Self-pay | Admitting: Family Medicine

## 2023-05-03 DIAGNOSIS — E1122 Type 2 diabetes mellitus with diabetic chronic kidney disease: Secondary | ICD-10-CM

## 2023-05-03 MED ORDER — LEVEMIR FLEXTOUCH 100 UNIT/ML ~~LOC~~ SOPN
PEN_INJECTOR | SUBCUTANEOUS | 11 refills | Status: DC
Start: 2023-05-03 — End: 2023-05-05

## 2023-05-03 NOTE — Telephone Encounter (Signed)
[name] (for son)   Noted  Copied Sleeved  Original in PCP box Copy front desk folder

## 2023-05-03 NOTE — Telephone Encounter (Signed)
Pt needing refill on insulin detemir (LEVEMIR FLEXTOUCH) 100 UNIT/ML FlexPen

## 2023-05-03 NOTE — Telephone Encounter (Signed)
Refill has been sent.  °

## 2023-05-04 NOTE — Telephone Encounter (Signed)
[name] called from [facility] saying patient LEVEMIR  is discontinued and needs to know is there an alternative replacement medication be sent to [facility]. Call back # [phone].

## 2023-05-05 ENCOUNTER — Other Ambulatory Visit: Payer: Self-pay | Admitting: Family Medicine

## 2023-05-05 DIAGNOSIS — E1122 Type 2 diabetes mellitus with diabetic chronic kidney disease: Secondary | ICD-10-CM

## 2023-05-05 MED ORDER — LANTUS SOLOSTAR 100 UNIT/ML ~~LOC~~ SOPN: PEN_INJECTOR | SUBCUTANEOUS | 99 refills | Status: DC

## 2023-05-05 NOTE — Telephone Encounter (Signed)
Levemir is d/c per pharmacy they need an alternative pt will be running out by the weekend, please advice?

## 2023-05-05 NOTE — Telephone Encounter (Signed)
Pt is in at a group home they are unable to call around to ask who is in stock, states per pharmacy it is a global manufactuing issue medication will not be available after 10/03/23.

## 2023-05-05 NOTE — Telephone Encounter (Signed)
Please inform the pt that a RX for lantus is sent to the pharmacy. Same dosage as levemir

## 2023-05-05 NOTE — Telephone Encounter (Signed)
Please ask the patient to check with other pharmacies to confirm if Levemir is in stock. Once confirmed, I will send the prescription.

## 2023-05-05 NOTE — Telephone Encounter (Signed)
[name] called from [facility] about this medication.  Has only a few days left.

## 2023-05-08 NOTE — Telephone Encounter (Signed)
Group home has been informed.

## 2023-05-10 ENCOUNTER — Other Ambulatory Visit: Payer: Self-pay | Admitting: Family Medicine

## 2023-05-10 ENCOUNTER — Telehealth: Payer: Self-pay | Admitting: Family Medicine

## 2023-05-10 DIAGNOSIS — Z794 Long term (current) use of insulin: Secondary | ICD-10-CM

## 2023-05-10 NOTE — Telephone Encounter (Signed)
FL2 form  Copied Noted Sleeved (put in provider box)  Fax back to [phone] when completed

## 2023-05-15 DIAGNOSIS — Z0279 Encounter for issue of other medical certificate: Secondary | ICD-10-CM

## 2023-05-15 NOTE — Telephone Encounter (Deleted)
error 

## 2023-05-16 NOTE — Telephone Encounter (Signed)
 Faxed with confirmation. Form fee applied.

## 2023-05-17 NOTE — Telephone Encounter (Signed)
Called patient son will pick up forms

## 2023-05-17 NOTE — Telephone Encounter (Signed)
Patient came by to pick up forms, the FL2 was given back to nurse to have Gilmore Laroche to sign off of first page, forms were incomplete. Once signed will refax and will call patient ready for pickup.

## 2023-05-18 NOTE — Telephone Encounter (Signed)
Forms faxed. Called patient son to pick up a copy. Forms are in front desk folder.

## 2023-05-18 NOTE — Telephone Encounter (Signed)
[name] forms were faxed. Patient son pick up forms.

## 2023-05-22 ENCOUNTER — Ambulatory Visit: Payer: Medicare HMO | Admitting: Family Medicine

## 2023-05-31 ENCOUNTER — Telehealth: Payer: Self-pay | Admitting: Family Medicine

## 2023-05-31 ENCOUNTER — Other Ambulatory Visit: Payer: Self-pay

## 2023-05-31 DIAGNOSIS — E1122 Type 2 diabetes mellitus with diabetic chronic kidney disease: Secondary | ICD-10-CM

## 2023-05-31 MED ORDER — APIXABAN 2.5 MG PO TABS: 2.5000 mg | ORAL_TABLET | Freq: Two times a day (BID) | ORAL | 11 refills | Status: DC

## 2023-05-31 MED ORDER — ONETOUCH DELICA PLUS LANCET33G MISC: 11 refills | Status: DC

## 2023-05-31 MED ORDER — ALCOHOL PREP PADS: MEDICATED_PAD | 5 refills | Status: DC

## 2023-05-31 NOTE — Telephone Encounter (Signed)
Rx refill(s) sent.

## 2023-05-31 NOTE — Telephone Encounter (Signed)
Prescription Request  05/31/2023  LOV: 12/19/2022  What is the name of the medication or equipment? ELIQUIS 2.5 MG TABS tablet [[id number]]   Lancets [facility] DELICA PLUS LANCET33G) MISC [[id number]   Alcohol Swabs (ALCOHOL PREP) PADS [[id number]]   Have you contacted your pharmacy to request a refill? No   Which pharmacy would you like this sent to?  [facility] [place], [place] - [address] [place] [address] Phone: [phone] Fax: [phone]    Patient notified that their request is being sent to the clinical staff for review and that they should receive a response within 2 business days.   Please advise at [facility] [phone]

## 2023-06-14 ENCOUNTER — Telehealth: Payer: Self-pay | Admitting: Family Medicine

## 2023-06-14 NOTE — Telephone Encounter (Signed)
lmtrc

## 2023-06-14 NOTE — Telephone Encounter (Signed)
Division of Health Service Regulations calling for ambulatory status on pt. Please advise [phone]. Thank you

## 2023-07-03 ENCOUNTER — Other Ambulatory Visit: Payer: Self-pay | Admitting: Family Medicine

## 2023-07-03 DIAGNOSIS — K5900 Constipation, unspecified: Secondary | ICD-10-CM

## 2023-07-03 DIAGNOSIS — F419 Anxiety disorder, unspecified: Secondary | ICD-10-CM

## 2023-07-03 DIAGNOSIS — R634 Abnormal weight loss: Secondary | ICD-10-CM

## 2023-07-03 DIAGNOSIS — I10 Essential (primary) hypertension: Secondary | ICD-10-CM

## 2023-07-11 ENCOUNTER — Telehealth: Payer: Self-pay | Admitting: Family Medicine

## 2023-07-11 ENCOUNTER — Other Ambulatory Visit: Payer: Self-pay | Admitting: Family Medicine

## 2023-07-11 DIAGNOSIS — E1122 Type 2 diabetes mellitus with diabetic chronic kidney disease: Secondary | ICD-10-CM

## 2023-07-11 NOTE — Telephone Encounter (Signed)
Error. Refills already sent to pharm

## 2023-07-12 ENCOUNTER — Other Ambulatory Visit: Payer: Self-pay | Admitting: Family Medicine

## 2023-07-27 ENCOUNTER — Ambulatory Visit: Payer: Medicare HMO | Admitting: Family Medicine

## 2023-07-28 ENCOUNTER — Ambulatory Visit: Payer: Medicare HMO | Admitting: Family Medicine

## 2023-08-11 ENCOUNTER — Ambulatory Visit: Payer: Medicare HMO | Admitting: Family Medicine

## 2023-08-21 ENCOUNTER — Other Ambulatory Visit: Payer: Self-pay

## 2023-08-21 ENCOUNTER — Telehealth: Payer: Self-pay | Admitting: Family Medicine

## 2023-08-21 ENCOUNTER — Encounter: Payer: Medicare HMO | Admitting: Family Medicine

## 2023-08-21 ENCOUNTER — Other Ambulatory Visit: Payer: Self-pay | Admitting: Family Medicine

## 2023-08-21 DIAGNOSIS — I10 Essential (primary) hypertension: Secondary | ICD-10-CM

## 2023-08-21 DIAGNOSIS — K5900 Constipation, unspecified: Secondary | ICD-10-CM

## 2023-08-21 MED ORDER — DOCUSATE SODIUM 100 MG PO CAPS: 100.0000 mg | ORAL_CAPSULE | Freq: Two times a day (BID) | ORAL | 0 refills | Status: DC

## 2023-08-21 MED ORDER — LOSARTAN POTASSIUM 25 MG PO TABS: 25.0000 mg | ORAL_TABLET | Freq: Every day | ORAL | 0 refills | Status: DC

## 2023-08-21 NOTE — Telephone Encounter (Signed)
Refills sent

## 2023-08-21 NOTE — Telephone Encounter (Signed)
Prescription Request  08/21/2023  LOV: 12/19/2022  What is the name of the medication or equipment? docusate sodium (COLACE) 100 MG capsule [[id number]]  losartan (COZAAR) 25 MG tablet [[id number]]     Have you contacted your pharmacy to request a refill? Yes   Which pharmacy would you like this sent to?  [facility] [place], [place] - [address] [place] [address] Phone: [phone] Fax: [phone]    Patient notified that their request is being sent to the clinical staff for review and that they should receive a response within 2 business days.   Please advise at [facility] [phone]

## 2023-08-21 NOTE — Progress Notes (Unsigned)
Subjective:   Betty [name] is a 89 y.o. female who presents for Medicare Annual (Subsequent) preventive examination.  Visit Complete: {[phone]}  Patient Medicare AWV questionnaire was completed by the patient on ***; I have confirmed that all information answered by patient is correct and no changes since this date.        Objective:    There were no vitals filed for this visit. There is no height or weight on file to calculate BMI.     12/13/2021    1:04 PM 11/19/2021   12:04 PM 07/07/2021   11:05 AM 03/09/2020    8:43 AM 05/03/2018   11:26 AM 05/03/2018   11:15 AM 04/17/2018    1:04 PM  Advanced Directives  Does Patient Have a Medical Advance Directive? No No No Yes Yes Yes Yes  Type of Advance Directive     -- -- --  Does patient want to make changes to medical advance directive?    No - Guardian declined No - Patient declined No - Patient declined No - Patient declined  Would patient like information on creating a medical advance directive? No - Patient declined No - Patient declined No - Patient declined  No - Patient declined No - Patient declined No - Patient declined    Current Medications (verified) Outpatient Encounter Medications as of 08/21/2023  Medication Sig   Alcohol Swabs (ALCOHOL PREP) PADS Use to check blood sugar daily   Blood Glucose Monitoring Suppl (BLOOD GLUCOSE SYSTEM PAK) KIT Use as instructed for 3x daily testing. Dx: E11.22.   busPIRone (BUSPAR) 5 MG tablet TAKE 1 TABLET BY MOUTH TWICE DAILY.   divalproex (DEPAKOTE) 125 MG DR tablet TAKE 1 TABLET BY MOUTH TWICE DAILY.   docusate sodium (COLACE) 100 MG capsule TAKE 1 CAPSULE BY MOUTH TWICE DAILY.   ELIQUIS 2.5 MG TABS tablet TAKE 1 TABLET BY MOUTH TWICE DAILY.   GLOBAL EASE INJECT PEN NEEDLES 31G X 8 MM MISC USE WITH NOVOLOG AND LANTUS 4 TIMES DAILY.   glucose blood (ONETOUCH VERIO) test strip USE TO TEST BLOOD SUGAR 3 TIMES A DAY.   hydrALAZINE (APRESOLINE) 25 MG tablet TAKE 1  TABLET BY MOUTH TWICE DAILY.   insulin aspart (NOVOLOG FLEXPEN) 100 UNIT/ML FlexPen Inject per sliding scale 3x daily with meals: 151-200 2U, 201-250 4U, 251- 300 6U, 301-350 8U, 351-400 10U, <60/>400 call MD   insulin glargine (LANTUS SOLOSTAR) 100 UNIT/ML Solostar Pen 6U QAM and 4U QHS   Insulin Pen Needle (BD PEN NEEDLE NANO U/F) 32G X 4 MM MISC USE AS DIRECTED TO INJECT NOVOLOG 3 TIMES A DAY AND LANTUS AT BEDTIME.   Lancet Devices (ONETOUCH DELICA PLUS LANCING) MISC    Lancets (ONETOUCH DELICA PLUS LANCET33G) MISC USE TO TEST BLOOD SUGAR 3 TIMES DAILY   losartan (COZAAR) 25 MG tablet TAKE 1 TABLET BY MOUTH ONCE DAILY.   metoprolol tartrate (LOPRESSOR) 25 MG tablet TAKE 1 TABLET BY MOUTH TWICE DAILY.   mirtazapine (REMERON) 15 MG tablet TAKE 1 TABLET (15 MG TOTAL) BY MOUTH AT BEDTIME.   NAMZARIC 28-10 MG CP24 TAKE 1 CAPSULE BY MOUTH AT BEDTIME.   OYSTER SHELL CALCIUM PLUS D 500-5 MG-MCG TABS TAKE 1 TABLET BY MOUTH DAILY.   rosuvastatin (CRESTOR) 20 MG tablet TAKE 1 TABLET BY MOUTH AT BEDTIME.   SURE COMFORT PEN NEEDLES 31G X 5 MM MISC USE WITH NOVOLOG AND LANTUS 4 TIMES DAILY.   Vitamin D, Ergocalciferol, 50000 units CAPS TAKE 1 CAPSULE BY  MOUTH WEEKLY ON FRIDAY.   No facility-administered encounter medications on file as of 08/21/2023.    Allergies (verified) Thiazide-type diuretics   History: Past Medical History:  Diagnosis Date   Alzheimer disease (HCC)    Chronic kidney disease    stage II   Closed intertrochanteric fracture of femur (HCC) 03/27/2018   Dementia (HCC)    Diabetes mellitus    Grade I diastolic dysfunction 06/25/2020   Hyperlipidemia    Hypertension    Past Surgical History:  Procedure Laterality Date   ABDOMINAL HYSTERECTOMY     BACK SURGERY     INTRAMEDULLARY (IM) NAIL INTERTROCHANTERIC Left 03/28/2018   Procedure: INTRAMEDULLARY (IM) NAIL INTERTROCHANTRIC;  Surgeon: Vickki Hearing, MD;  Location: [facility];  Service: Orthopedics;  Laterality: Left;    Family History  Problem Relation Age of Onset   Hypertension Mother    Cancer Father        colon    Cancer Sister        Breast cancer   Osteoporosis Sister    Social History   Socioeconomic History   Marital status: Widowed    Spouse name: Not on file   Number of children: 4   Years of education: Not on file   Highest education level: Not on file  Occupational History   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; Midwife and folded drapes  Tobacco Use   Smoking status: Never   Smokeless tobacco: Never  Vaping Use   Vaping status: Never Used  Substance and Sexual Activity   Alcohol use: No   Drug use: No   Sexual activity: Not Currently  Other Topics Concern   Not on file  Social History Narrative   2 sons and 2 daughters; 1 son deceased; 1 sister in town and 1 in [place]   Lives at [facility]-07/07/2021.   Social Determinants of Health   Financial Resource Strain: Low Risk  (07/07/2021)   Overall Financial Resource Strain (CARDIA)    Difficulty of Paying Living Expenses: Not hard at all  Food Insecurity: No Food Insecurity (07/07/2021)   Hunger Vital Sign    Worried About Running Out of Food in the Last Year: Never true    Ran Out of Food in the Last Year: Never true  Transportation Needs: No Transportation Needs (07/07/2021)   PRAPARE - Administrator, Civil Service (Medical): No    Lack of Transportation (Non-Medical): No  Physical Activity: Sufficiently Active (07/07/2021)   Exercise Vital Sign    Days of Exercise per Week: 5 days    Minutes of Exercise per Session: 30 min  Stress: Not on file  Social Connections: Socially Isolated (07/07/2021)   Social Connection and Isolation Panel [NHANES]    Frequency of Communication with Friends and Family: Never    Frequency of Social Gatherings with Friends and Family: Twice a week    Attends Religious Services: Never    Database administrator or Organizations: No    Attends Tax inspector Meetings: Never    Marital Status: Widowed    Tobacco Counseling Counseling given: Not Answered   Clinical Intake:                        Activities of Daily Living     No data to display          Patient Care Team: Gilmore Laroche, FNP as PCP - General (Family Medicine) Doreatha Massed, MD  as Systems developer (Hematology)  Indicate any recent Medical Services you may have received from other than [facility] providers in the past year (date may be approximate).     Assessment:   This is a routine wellness examination for [place].  Hearing/Vision screen No results found.   Goals Addressed   None    Depression Screen    12/19/2022    2:33 PM 07/25/2022   11:12 AM 03/17/2022    4:13 PM 11/30/2021   10:39 AM 11/08/2021    9:47 AM 10/06/2021    9:38 AM 07/07/2021   11:16 AM  PHQ 2/9 Scores  PHQ - 2 Score 0 0 0 0 0 0   PHQ- 9 Score 0        Exception Documentation       Medical reason    Fall Risk    12/19/2022    2:33 PM 07/25/2022   11:12 AM 03/17/2022    4:13 PM 11/30/2021   10:39 AM 11/08/2021    9:47 AM  Fall Risk   Falls in the past year? 0 0 0 0 0  Number falls in past yr: 0 0 0 0 0  Injury with Fall? 0 0 0 0 0  Risk for fall due to : No Fall Risks No Fall Risks No Fall Risks No Fall Risks No Fall Risks  Follow up Falls evaluation completed Falls evaluation completed Falls evaluation completed Falls evaluation completed Falls evaluation completed    MEDICARE RISK AT HOME:    TIMED UP AND GO:  Was the test performed?  {[phone]}    Cognitive Function:        Immunizations Immunization History  Administered Date(s) Administered   Fluad Quad(high Dose 65+) 07/29/2019, 07/30/2021, 07/25/2022   Influenza Split 06/21/2012   Influenza, High Dose Seasonal PF 07/11/2017, 09/19/2018   Influenza,inj,Quad PF,6+ Mos 07/02/2013, 07/30/2014, 09/04/2015, 08/05/2016   Influenza-Unspecified 08/02/2001, 07/17/2002,  07/27/2006   Moderna SARS-COV2 Booster Vaccination 07/30/2021   PFIZER(Purple Top)SARS-COV-2 Vaccination 11/24/2019, 12/18/2019   Pneumococcal Conjugate-13 11/01/2013   Pneumococcal Polysaccharide-23 03/29/2012   Td 03/14/2000   Zoster, Live 09/20/2012    {TDAP status:2101805}  {Flu Vaccine status:2101806}  {Pneumococcal vaccine status:2101807}  {Covid-19 vaccine status:2101808}  Qualifies for Shingles Vaccine? {YES/NO:21197}  Zostavax completed {YES/NO:21197}  {Shingrix Completed?:2101804}  Screening Tests Health Maintenance  Topic Date Due   Zoster Vaccines- Shingrix (1 of 2) 10/24/1982   DTaP/Tdap/Td (2 - Tdap) 03/14/2010   OPHTHALMOLOGY EXAM  01/14/2016   FOOT EXAM  10/06/2022   HEMOGLOBIN A1C  01/24/2023   INFLUENZA VACCINE  05/04/2023   COVID-19 Vaccine (4 - 2023-24 season) 06/04/2023   Medicare Annual Wellness (AWV)  07/12/2023   Pneumonia Vaccine 52+ Years old  Completed   HPV VACCINES  Aged Out   DEXA SCAN  Discontinued    Health Maintenance  Health Maintenance Due  Topic Date Due   Zoster Vaccines- Shingrix (1 of 2) 10/24/1982   DTaP/Tdap/Td (2 - Tdap) 03/14/2010   OPHTHALMOLOGY EXAM  01/14/2016   FOOT EXAM  10/06/2022   HEMOGLOBIN A1C  01/24/2023   INFLUENZA VACCINE  05/04/2023   COVID-19 Vaccine (4 - 2023-24 season) 06/04/2023   Medicare Annual Wellness (AWV)  07/12/2023    {Colorectal cancer screening:2101809}  {Mammogram status:21018020}  {Bone Density status:21018021}  Lung Cancer Screening: (Low Dose CT Chest recommended if Age 31-80 years, 20 pack-year currently smoking OR have quit w/in 15years.) {DOES NOT does:27190::"does not"} qualify.   Lung Cancer Screening Referral: ***  Additional  Screening:  Hepatitis C Screening: {DOES NOT does:27190::"does not"} qualify; Completed ***  Vision Screening: Recommended annual ophthalmology exams for early detection of glaucoma and other disorders of the eye. Is the patient up to date with their  annual eye exam?  {YES/NO:21197} Who is the provider or what is the name of the office in which the patient attends annual eye exams? *** If pt is not established with a provider, would they like to be referred to a provider to establish care? {YES/NO:21197}.   Dental Screening: Recommended annual dental exams for proper oral hygiene  Diabetic Foot Exam: Diabetic Foot Exam: Overdue, Pt has been advised about the importance in completing this exam. Pt is scheduled for diabetic foot exam on ***.  Community Resource Referral / Chronic Care Management: CRR required this visit?  {YES/NO:21197}  CCM required this visit?  {CCM Required choices:[id number]}     Plan:     I have personally reviewed and noted the following in the patient's chart:   Medical and social history Use of alcohol, tobacco or illicit drugs  Current medications and supplements including opioid prescriptions. {Opioid Prescriptions:[phone]} Functional ability and status Nutritional status Physical activity Advanced directives List of other physicians Hospitalizations, surgeries, and ER visits in previous 12 months Vitals Screenings to include cognitive, depression, and falls Referrals and appointments  In addition, I have reviewed and discussed with patient certain preventive protocols, quality metrics, and best practice recommendations. A written personalized care plan for preventive services as well as general preventive health recommendations were provided to patient.     Cruzita Lederer Newman Nip, FNP   08/21/2023   After Visit Summary: {CHL AMB AWV After Visit Summary:[phone]}

## 2023-08-22 ENCOUNTER — Ambulatory Visit (INDEPENDENT_AMBULATORY_CARE_PROVIDER_SITE_OTHER): Payer: Medicare HMO

## 2023-08-22 ENCOUNTER — Encounter: Payer: Medicare HMO | Admitting: Family Medicine

## 2023-08-22 VITALS — Ht 60.0 in | Wt 135.0 lb

## 2023-08-22 DIAGNOSIS — Z0000 Encounter for general adult medical examination without abnormal findings: Secondary | ICD-10-CM | POA: Diagnosis not present

## 2023-08-22 NOTE — Progress Notes (Signed)
 Visit completed with the help of [name], patient's caregiver at [facility]  Because this visit was a virtual/telehealth visit,  certain criteria was not obtained, such a blood pressure, CBG if applicable, and timed get up and go. Any medications not marked as "taking" were not mentioned during the medication reconciliation part of the visit. Any vitals not documented were not able to be obtained due to this being a telehealth visit or patient was unable to self-report a recent blood pressure reading due to a lack of equipment at home via telehealth. Vitals that have been documented are verbally provided by the patient.   Subjective:   Betty [name] is a 89 y.o. female who presents for Medicare Annual (Subsequent) preventive examination.  Visit Complete: Virtual I connected with  [name] on 08/22/23 by a audio enabled telemedicine application and verified that I am speaking with the correct person using two identifiers.  Patient Location: Skilled Nursing Facility  Provider Location: Home Office  I discussed the limitations of evaluation and management by telemedicine. The patient expressed understanding and agreed to proceed.  Vital Signs: Because this visit was a virtual/telehealth visit, some criteria may be missing or patient reported. Any vitals not documented were not able to be obtained and vitals that have been documented are patient reported.  Patient Medicare AWV questionnaire was completed by the patient on na; I have confirmed that all information answered by patient is correct and no changes since this date.  Cardiac Risk Factors include: advanced age (>12men, >39 women);diabetes mellitus;dyslipidemia;hypertension;sedentary lifestyle     Objective:    Today's Vitals   08/22/23 1347  Weight: 135 lb (61.2 kg)  Height: 5' (1.524 m)   Body mass index is 26.37 kg/m.     08/22/2023    1:42 PM 12/13/2021    1:04 PM 11/19/2021   12:04 PM  07/07/2021   11:05 AM 03/09/2020    8:43 AM 05/03/2018   11:26 AM 05/03/2018   11:15 AM  Advanced Directives  Does Patient Have a Medical Advance Directive? No No No No Yes Yes Yes  Type of Advance Directive      -- --  Does patient want to make changes to medical advance directive?     No - Guardian declined No - Patient declined No - Patient declined  Would patient like information on creating a medical advance directive? No - Patient declined No - Patient declined No - Patient declined No - Patient declined  No - Patient declined No - Patient declined    Current Medications (verified) Outpatient Encounter Medications as of 08/22/2023  Medication Sig   Alcohol Swabs (ALCOHOL PREP) PADS Use to check blood sugar daily   Blood Glucose Monitoring Suppl (BLOOD GLUCOSE SYSTEM PAK) KIT Use as instructed for 3x daily testing. Dx: E11.22.   busPIRone (BUSPAR) 5 MG tablet TAKE 1 TABLET BY MOUTH TWICE DAILY.   divalproex (DEPAKOTE) 125 MG DR tablet TAKE 1 TABLET BY MOUTH TWICE DAILY.   docusate sodium (COLACE) 100 MG capsule Take 1 capsule (100 mg total) by mouth 2 (two) times daily.   ELIQUIS 2.5 MG TABS tablet TAKE 1 TABLET BY MOUTH TWICE DAILY.   GLOBAL EASE INJECT PEN NEEDLES 31G X 8 MM MISC USE WITH NOVOLOG AND LANTUS 4 TIMES DAILY.   glucose blood (ONETOUCH VERIO) test strip USE TO TEST BLOOD SUGAR 3 TIMES A DAY.   hydrALAZINE (APRESOLINE) 25 MG tablet TAKE 1 TABLET BY MOUTH TWICE DAILY.  insulin aspart (NOVOLOG FLEXPEN) 100 UNIT/ML FlexPen Inject per sliding scale 3x daily with meals: 151-200 2U, 201-250 4U, 251- 300 6U, 301-350 8U, 351-400 10U, <60/>400 call MD   insulin glargine (LANTUS SOLOSTAR) 100 UNIT/ML Solostar Pen 6U QAM and 4U QHS   Insulin Pen Needle (BD PEN NEEDLE NANO U/F) 32G X 4 MM MISC USE AS DIRECTED TO INJECT NOVOLOG 3 TIMES A DAY AND LANTUS AT BEDTIME.   Lancet Devices (ONETOUCH DELICA PLUS LANCING) MISC    Lancets (ONETOUCH DELICA PLUS LANCET33G) MISC USE TO TEST BLOOD SUGAR 3  TIMES DAILY   losartan (COZAAR) 25 MG tablet Take 1 tablet (25 mg total) by mouth daily.   metoprolol tartrate (LOPRESSOR) 25 MG tablet TAKE 1 TABLET BY MOUTH TWICE DAILY.   mirtazapine (REMERON) 15 MG tablet TAKE 1 TABLET (15 MG TOTAL) BY MOUTH AT BEDTIME.   NAMZARIC 28-10 MG CP24 TAKE 1 CAPSULE BY MOUTH AT BEDTIME.   OYSTER SHELL CALCIUM PLUS D 500-5 MG-MCG TABS TAKE 1 TABLET BY MOUTH DAILY.   rosuvastatin (CRESTOR) 20 MG tablet TAKE 1 TABLET BY MOUTH AT BEDTIME.   SURE COMFORT PEN NEEDLES 31G X 5 MM MISC USE WITH NOVOLOG AND LANTUS 4 TIMES DAILY.   Vitamin D, Ergocalciferol, 50000 units CAPS TAKE 1 CAPSULE BY MOUTH WEEKLY ON FRIDAY.   No facility-administered encounter medications on file as of 08/22/2023.    Allergies (verified) Thiazide-type diuretics   History: Past Medical History:  Diagnosis Date   Alzheimer disease (HCC)    Chronic kidney disease    stage II   Closed intertrochanteric fracture of femur (HCC) 03/27/2018   Dementia (HCC)    Diabetes mellitus    Grade I diastolic dysfunction 06/25/2020   Hyperlipidemia    Hypertension    Past Surgical History:  Procedure Laterality Date   ABDOMINAL HYSTERECTOMY     BACK SURGERY     INTRAMEDULLARY (IM) NAIL INTERTROCHANTERIC Left 03/28/2018   Procedure: INTRAMEDULLARY (IM) NAIL INTERTROCHANTRIC;  Surgeon: Vickki Hearing, MD;  Location: [facility];  Service: Orthopedics;  Laterality: Left;   Family History  Problem Relation Age of Onset   Hypertension Mother    Cancer Father        colon    Cancer Sister        Breast cancer   Osteoporosis Sister    Social History   Socioeconomic History   Marital status: Widowed    Spouse name: Not on file   Number of children: 4   Years of education: Not on file   Highest education level: Not on file  Occupational History   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; Midwife and folded drapes  Tobacco Use   Smoking status: Never   Smokeless tobacco: Never  Vaping Use    Vaping status: Never Used  Substance and Sexual Activity   Alcohol use: No   Drug use: No   Sexual activity: Not Currently  Other Topics Concern   Not on file  Social History Narrative   2 sons and 2 daughters; 1 son deceased; 1 sister in town and 1 in [place]   Lives at [facility]-07/07/2021.   Social Determinants of Health   Financial Resource Strain: Low Risk  (08/22/2023)   Overall Financial Resource Strain (CARDIA)    Difficulty of Paying Living Expenses: Not hard at all  Food Insecurity: No Food Insecurity (08/22/2023)   Hunger Vital Sign    Worried About Running Out of Food in the Last Year: Never  true    Ran Out of Food in the Last Year: Never true  Transportation Needs: No Transportation Needs (08/22/2023)   PRAPARE - Administrator, Civil Service (Medical): No    Lack of Transportation (Non-Medical): No  Physical Activity: Patient Unable To Answer (08/22/2023)   Exercise Vital Sign    Days of Exercise per Week: Patient unable to answer    Minutes of Exercise per Session: Patient unable to answer  Stress: Patient Unable To Answer (08/22/2023)   [employer] of Occupational Health - Occupational Stress Questionnaire    Feeling of Stress : Patient unable to answer  Social Connections: Socially Isolated (08/22/2023)   Social Connection and Isolation Panel [NHANES]    Frequency of Communication with Friends and Family: Never    Frequency of Social Gatherings with Friends and Family: Twice a week    Attends Religious Services: Never    Database administrator or Organizations: No    Attends Banker Meetings: Never    Marital Status: Widowed    Tobacco Counseling Counseling given: Yes   Clinical Intake:  Pre-visit preparation completed: Yes  Pain : No/denies pain     Diabetes: Yes CBG done?: No Did pt. bring in CBG monitor from home?: No  How often do you need to have someone help you when you read instructions,  pamphlets, or other written materials from your doctor or pharmacy?: 5 - Always  Interpreter Needed?: No  Information entered by ::  Peggi Yono, CMA   Activities of Daily Living    08/22/2023    1:46 PM  In your present state of health, do you have any difficulty performing the following activities:  Hearing? 0  Vision? 0  Difficulty concentrating or making decisions? 1  Walking or climbing stairs? 1  Dressing or bathing? 1  Doing errands, shopping? 1  Preparing Food and eating ? Y  Using the Toilet? Y  In the past six months, have you accidently leaked urine? N  Do you have problems with loss of bowel control? N  Managing your Medications? Y  Managing your Finances? Y  Housekeeping or managing your Housekeeping? Y    Patient Care Team: Gilmore Laroche, FNP as PCP - General (Family Medicine) Doreatha Massed, MD as Medical Oncologist (Hematology)  Indicate any recent Medical Services you may have received from other than [facility] providers in the past year (date may be approximate).     Assessment:   This is a routine wellness examination for Betty [name].  Hearing/Vision screen Hearing Screening - Comments:: Patient denies any hearing difficulties.   Vision Screening - Comments:: Patient wears eye glasses and is utd with yearly exams    Goals Addressed             This Visit's Progress    Patient Stated       To remain as healthy as possible        Depression Screen    08/22/2023    1:49 PM 12/19/2022    2:33 PM 07/25/2022   11:12 AM 03/17/2022    4:13 PM 11/30/2021   10:39 AM 11/08/2021    9:47 AM 10/06/2021    9:38 AM  PHQ 2/9 Scores  PHQ - 2 Score 0 0 0 0 0 0 0  PHQ- 9 Score  0         Fall Risk    08/22/2023    1:45 PM 12/19/2022    2:33 PM 07/25/2022   11:12  AM 03/17/2022    4:13 PM 11/30/2021   10:39 AM  Fall Risk   Falls in the past year? 0 0 0 0 0  Number falls in past yr: 0 0 0 0 0  Injury with Fall? 0 0 0 0 0  Risk for fall due to :  History of fall(s);Impaired balance/gait;Impaired mobility;Mental status change No Fall Risks No Fall Risks No Fall Risks No Fall Risks  Risk for fall due to: Comment patient has dementia      Follow up Education provided;Falls prevention discussed Falls evaluation completed Falls evaluation completed Falls evaluation completed Falls evaluation completed    MEDICARE RISK AT HOME: Medicare Risk at Home Any stairs in or around the home?: No If so, are there any without handrails?: No Home free of loose throw rugs in walkways, pet beds, electrical cords, etc?: Yes Adequate lighting in your home to reduce risk of falls?: Yes Life alert?: No Use of a cane, walker or w/c?: Yes Grab bars in the bathroom?: Yes Shower chair or bench in shower?: Yes Elevated toilet seat or a handicapped toilet?: Yes  TIMED UP AND GO:  Was the test performed?  No    Cognitive Function:    08/22/2023    1:52 PM  MMSE - Mini Mental State Exam  Not completed: Unable to complete        Immunizations Immunization History  Administered Date(s) Administered   Fluad Quad(high Dose 65+) 07/29/2019, 07/30/2021, 07/25/2022   Influenza Split 06/21/2012   Influenza, High Dose Seasonal PF 07/11/2017, 09/19/2018   Influenza,inj,Quad PF,6+ Mos 07/02/2013, 07/30/2014, 09/04/2015, 08/05/2016   Influenza-Unspecified 08/02/2001, 07/17/2002, 07/27/2006   Moderna SARS-COV2 Booster Vaccination 07/30/2021   PFIZER(Purple Top)SARS-COV-2 Vaccination 11/24/2019, 12/18/2019   Pneumococcal Conjugate-13 11/01/2013   Pneumococcal Polysaccharide-23 03/29/2012   Td 03/14/2000   Zoster, Live 09/20/2012    TDAP status: Due, Education has been provided regarding the importance of this vaccine. Advised may receive this vaccine at local pharmacy or Health Dept. Aware to provide a copy of the vaccination record if obtained from local pharmacy or Health Dept. Verbalized acceptance and understanding.  Flu Vaccine status: Due, Education  has been provided regarding the importance of this vaccine. Advised may receive this vaccine at local pharmacy or Health Dept. Aware to provide a copy of the vaccination record if obtained from local pharmacy or Health Dept. Verbalized acceptance and understanding.  Pneumococcal vaccine status: Up to date  Covid-19 vaccine status: Information provided on how to obtain vaccines.   Qualifies for Shingles Vaccine? Yes   Zostavax completed Yes   Shingrix Completed?: No.    Education has been provided regarding the importance of this vaccine. Patient has been advised to call insurance company to determine out of pocket expense if they have not yet received this vaccine. Advised may also receive vaccine at local pharmacy or Health Dept. Verbalized acceptance and understanding.  Screening Tests Health Maintenance  Topic Date Due   Zoster Vaccines- Shingrix (1 of 2) 10/24/1982   DTaP/Tdap/Td (2 - Tdap) 03/14/2010   OPHTHALMOLOGY EXAM  01/14/2016   FOOT EXAM  10/06/2022   HEMOGLOBIN A1C  01/24/2023   INFLUENZA VACCINE  05/04/2023   COVID-19 Vaccine (4 - 2023-24 season) 06/04/2023   Medicare Annual Wellness (AWV)  07/12/2023   Pneumonia Vaccine 36+ Years old  Completed   HPV VACCINES  Aged Out   DEXA SCAN  Discontinued    Health Maintenance  Health Maintenance Due  Topic Date Due   Zoster  Vaccines- Shingrix (1 of 2) 10/24/1982   DTaP/Tdap/Td (2 - Tdap) 03/14/2010   OPHTHALMOLOGY EXAM  01/14/2016   FOOT EXAM  10/06/2022   HEMOGLOBIN A1C  01/24/2023   INFLUENZA VACCINE  05/04/2023   COVID-19 Vaccine (4 - 2023-24 season) 06/04/2023   Medicare Annual Wellness (AWV)  07/12/2023    Colorectal cancer screening: No longer required.   Mammogram status: No longer required due to age.  Bone Density Screening: Not age appropriate for this patient.    Lung Cancer Screening: (Low Dose CT Chest recommended if Age 79-80 years, 20 pack-year currently smoking OR have quit w/in 15years.) does not  qualify.   Lung Cancer Screening Referral: na  Additional Screening:  Hepatitis C Screening: does not qualify;    Vision Screening: Recommended annual ophthalmology exams for early detection of glaucoma and other disorders of the eye. Is the patient up to date with their annual eye exam?  Yes  Who is the provider or what is the name of the office in which the patient attends annual eye exams? Caregiver unsure of the name of patient's eyecare provider If pt is not established with a provider, would they like to be referred to a provider to establish care? No .   Dental Screening: Recommended annual dental exams for proper oral hygiene  Diabetic Foot Exam: Diabetic Foot Exam: Overdue, Pt has been advised about the importance in completing this exam. Pt is scheduled for diabetic foot exam on pcp notified.  Community Resource Referral / Chronic Care Management: CRR required this visit?  No   CCM required this visit?  No     Plan:     I have personally reviewed and noted the following in the patient's chart:   Medical and social history Use of alcohol, tobacco or illicit drugs  Current medications and supplements including opioid prescriptions. Patient is not currently taking opioid prescriptions. Functional ability and status Nutritional status Physical activity Advanced directives List of other physicians Hospitalizations, surgeries, and ER visits in previous 12 months Vitals Screenings to include cognitive, depression, and falls Referrals and appointments  In addition, I have reviewed and discussed with patient certain preventive protocols, quality metrics, and best practice recommendations. A written personalized care plan for preventive services as well as general preventive health recommendations were provided to patient.     Jordan Hawks Claudean Leavelle, CMA   08/22/2023   After Visit Summary: (Mail) Due to this being a telephonic visit, the after visit summary with patients  personalized plan was offered to patient via mail   Nurse Notes: visit completed with [name], caregiver at [facility]

## 2023-08-22 NOTE — Patient Instructions (Signed)
Betty [name] , Thank you for taking time to come for your Medicare Wellness Visit. I appreciate your ongoing commitment to your health goals. Please review the following plan we discussed and let me know if I can assist you in the future.   Referrals/Orders/Follow-Ups/Clinician Recommendations:  Next Medicare Annual Wellness Visit: August 27, 2024 at 12:30 pm virtual visit.   This is a list of the screening recommended for you and due dates:  Health Maintenance  Topic Date Due   Zoster (Shingles) Vaccine (1 of 2) 10/24/1982   DTaP/Tdap/Td vaccine (2 - Tdap) 03/14/2010   Eye exam for diabetics  01/14/2016   Complete foot exam   10/06/2022   Hemoglobin A1C  01/24/2023   Flu Shot  05/04/2023   COVID-19 Vaccine (4 - 2023-24 season) 06/04/2023   Medicare Annual Wellness Visit  08/21/2024   Pneumonia Vaccine  Completed   HPV Vaccine  Aged Out   DEXA scan (bone density measurement)  Discontinued    Advanced directives: (ACP Link)Information on Advanced Care Planning can be found at [facility] Advance Health Care Directives Advance Health Care Directives ([email or web address]   Next Medicare Annual Wellness Visit scheduled for next year: Yes   Preventive Care 65 Years and Older, Female Preventive care refers to lifestyle choices and visits with your health care provider that can promote health and wellness. Preventive care visits are also called wellness exams. What can I expect for my preventive care visit? Counseling Your health care provider may ask you questions about your: Medical history, including: Past medical problems. Family medical history. Pregnancy and menstrual history. History of falls. Current health, including: Memory and ability to understand (cognition). Emotional well-being. Home life and relationship well-being. Sexual activity and sexual health. Lifestyle, including: Alcohol, nicotine or tobacco, and drug use. Access to firearms. Diet,  exercise, and sleep habits. Work and work Astronomer. Sunscreen use. Safety issues such as seatbelt and bike helmet use. Physical exam Your health care provider will check your: Height and weight. These may be used to calculate your BMI (body mass index). BMI is a measurement that tells if you are at a healthy weight. Waist circumference. This measures the distance around your waistline. This measurement also tells if you are at a healthy weight and may help predict your risk of certain diseases, such as type 2 diabetes and high blood pressure. Heart rate and blood pressure. Body temperature. Skin for abnormal spots. What immunizations do I need?  Vaccines are usually given at various ages, according to a schedule. Your health care provider will recommend vaccines for you based on your age, medical history, and lifestyle or other factors, such as travel or where you work. What tests do I need? Screening Your health care provider may recommend screening tests for certain conditions. This may include: Lipid and cholesterol levels. Hepatitis C test. Hepatitis B test. HIV (human immunodeficiency virus) test. STI (sexually transmitted infection) testing, if you are at risk. Lung cancer screening. Colorectal cancer screening. Diabetes screening. This is done by checking your blood sugar (glucose) after you have not eaten for a while (fasting). Mammogram. Talk with your health care provider about how often you should have regular mammograms. BRCA-related cancer screening. This may be done if you have a family history of breast, ovarian, tubal, or peritoneal cancers. Bone density scan. This is done to screen for osteoporosis. Talk with your health care provider about your test results, treatment options, and if necessary, the need for more tests. Follow  these instructions at home: Eating and drinking  Eat a diet that includes fresh fruits and vegetables, whole grains, lean protein, and  low-fat dairy products. Limit your intake of foods with high amounts of sugar, saturated fats, and salt. Take vitamin and mineral supplements as recommended by your health care provider. Do not drink alcohol if your health care provider tells you not to drink. If you drink alcohol: Limit how much you have to 0-1 drink a day. Know how much alcohol is in your drink. In the [place] one drink equals one 12 oz bottle of beer (355 mL), one 5 oz glass of wine (148 mL), or one 1 oz glass of hard liquor (44 mL). Lifestyle Brush your teeth every morning and night with fluoride toothpaste. Floss one time each day. Exercise for at least 30 minutes 5 or more days each week. Do not use any products that contain nicotine or tobacco. These products include cigarettes, chewing tobacco, and vaping devices, such as e-cigarettes. If you need help quitting, ask your health care provider. Do not use drugs. If you are sexually active, practice safe sex. Use a condom or other form of protection in order to prevent STIs. Take aspirin only as told by your health care provider. Make sure that you understand how much to take and what form to take. Work with your health care provider to find out whether it is safe and beneficial for you to take aspirin daily. Ask your health care provider if you need to take a cholesterol-lowering medicine (statin). Find healthy ways to manage stress, such as: Meditation, yoga, or listening to music. Journaling. Talking to a trusted person. Spending time with friends and family. Minimize exposure to UV radiation to reduce your risk of skin cancer. Safety Always wear your seat belt while driving or riding in a vehicle. Do not drive: If you have been drinking alcohol. Do not ride with someone who has been drinking. When you are tired or distracted. While texting. If you have been using any mind-altering substances or drugs. Wear a helmet and other protective equipment during sports  activities. If you have firearms in your house, make sure you follow all gun safety procedures. What's next? Visit your health care provider once a year for an annual wellness visit. Ask your health care provider how often you should have your eyes and teeth checked. Stay up to date on all vaccines. This information is not intended to replace advice given to you by your health care provider. Make sure you discuss any questions you have with your health care provider. Document Revised: 03/17/2021 Document Reviewed: 03/17/2021 Elsevier Patient Education  2024 [employer]. Understanding Your Risk for Falls Millions of people have serious injuries from falls each year. It is important to understand your risk of falling. Talk with your health care provider about your risk and what you can do to lower it. If you do have a serious fall, make sure to tell your provider. Falling once raises your risk of falling again. How can falls affect me? Serious injuries from falls are common. These include: Broken bones, such as hip fractures. Head injuries, such as traumatic brain injuries (TBI) or concussions. A fear of falling can cause you to avoid activities and stay at home. This can make your muscles weaker and raise your risk for a fall. What can increase my risk? There are a number of risk factors that increase your risk for falling. The more risk factors you have, the higher  your risk of falling. Serious injuries from a fall happen most often to people who are older than 87 years old. Teenagers and young adults ages 62-29 are also at higher risk. Common risk factors include: Weakness in the lower body. Being generally weak or confused due to long-term (chronic) illness. Dizziness or balance problems. Poor vision. Medicines that cause dizziness or drowsiness. These may include: Medicines for your blood pressure, heart, anxiety, insomnia, or swelling (edema). Pain medicines. Muscle relaxants. Other  risk factors include: Drinking alcohol. Having had a fall in the past. Having foot pain or wearing improper footwear. Working at a dangerous job. Having any of the following in your home: Tripping hazards, such as floor clutter or loose rugs. Poor lighting. Pets. Having dementia or memory loss. What actions can I take to lower my risk of falling?     Physical activity Stay physically fit. Do strength and balance exercises. Consider taking a regular class to build strength and balance. Yoga and tai chi are good options. Vision Have your eyes checked every year and your prescription for glasses or contacts updated as needed. Shoes and walking aids Wear non-skid shoes. Wear shoes that have rubber soles and low heels. Do not wear high heels. Do not walk around the house in socks or slippers. Use a cane or walker as told by your provider. Home safety Attach secure railings on both sides of your stairs. Install grab bars for your bathtub, shower, and toilet. Use a non-skid mat in your bathtub or shower. Attach bath mats securely with double-sided, non-slip rug tape. Use good lighting in all rooms. Keep a flashlight near your bed. Make sure there is a clear path from your bed to the bathroom. Use night-lights. Do not use throw rugs. Make sure all carpeting is taped or tacked down securely. Remove all clutter from walkways and stairways, including extension cords. Repair uneven or broken steps and floors. Avoid walking on icy or slippery surfaces. Walk on the grass instead of on icy or slick sidewalks. Use ice melter to get rid of ice on walkways in the winter. Use a cordless phone. Questions to ask your health care provider Can you help me check my risk for a fall? Do any of my medicines make me more likely to fall? Should I take a vitamin D supplement? What exercises can I do to improve my strength and balance? Should I make an appointment to have my vision checked? Do I need a bone  density test to check for weak bones (osteoporosis)? Would it help to use a cane or a walker? Where to find more information Centers for Disease Control and Prevention, STEAD[employer]: TonerPromos.no Community-Based Fall Prevention Programs: TonerPromos.no [employer] on Aging: BaseRingTones.pl Contact a health care provider if: You fall at home. You are afraid of falling at home. You feel weak, drowsy, or dizzy. This information is not intended to replace advice given to you by your health care provider. Make sure you discuss any questions you have with your health care provider. Document Revised: 05/23/2022 Document Reviewed: 05/23/2022 Elsevier Patient Education  2024 [employer].

## 2023-08-26 DIAGNOSIS — M47816 Spondylosis without myelopathy or radiculopathy, lumbar region: Secondary | ICD-10-CM | POA: Diagnosis not present

## 2023-08-26 DIAGNOSIS — R410 Disorientation, unspecified: Secondary | ICD-10-CM | POA: Diagnosis not present

## 2023-08-26 DIAGNOSIS — I700 Atherosclerosis of aorta: Secondary | ICD-10-CM | POA: Diagnosis not present

## 2023-08-26 DIAGNOSIS — N29 Other disorders of kidney and ureter in diseases classified elsewhere: Secondary | ICD-10-CM | POA: Diagnosis not present

## 2023-08-26 DIAGNOSIS — Z743 Need for continuous supervision: Secondary | ICD-10-CM | POA: Diagnosis not present

## 2023-08-26 DIAGNOSIS — S72001A Fracture of unspecified part of neck of right femur, initial encounter for closed fracture: Secondary | ICD-10-CM | POA: Diagnosis not present

## 2023-08-26 DIAGNOSIS — R079 Chest pain, unspecified: Secondary | ICD-10-CM | POA: Diagnosis not present

## 2023-08-26 DIAGNOSIS — K5730 Diverticulosis of large intestine without perforation or abscess without bleeding: Secondary | ICD-10-CM | POA: Diagnosis not present

## 2023-08-26 DIAGNOSIS — I2510 Atherosclerotic heart disease of native coronary artery without angina pectoris: Secondary | ICD-10-CM | POA: Diagnosis not present

## 2023-08-26 DIAGNOSIS — I609 Nontraumatic subarachnoid hemorrhage, unspecified: Secondary | ICD-10-CM | POA: Diagnosis not present

## 2023-08-26 DIAGNOSIS — F29 Unspecified psychosis not due to a substance or known physiological condition: Secondary | ICD-10-CM | POA: Diagnosis not present

## 2023-08-26 DIAGNOSIS — N200 Calculus of kidney: Secondary | ICD-10-CM | POA: Diagnosis not present

## 2023-08-26 DIAGNOSIS — I6782 Cerebral ischemia: Secondary | ICD-10-CM | POA: Diagnosis not present

## 2023-08-26 DIAGNOSIS — I10 Essential (primary) hypertension: Secondary | ICD-10-CM | POA: Diagnosis not present

## 2023-08-26 DIAGNOSIS — S7291XA Unspecified fracture of right femur, initial encounter for closed fracture: Secondary | ICD-10-CM | POA: Diagnosis not present

## 2023-08-26 DIAGNOSIS — R739 Hyperglycemia, unspecified: Secondary | ICD-10-CM | POA: Diagnosis not present

## 2023-08-26 DIAGNOSIS — S2242XA Multiple fractures of ribs, left side, initial encounter for closed fracture: Secondary | ICD-10-CM | POA: Diagnosis not present

## 2023-08-26 DIAGNOSIS — F0390 Unspecified dementia without behavioral disturbance: Secondary | ICD-10-CM | POA: Diagnosis not present

## 2023-08-26 DIAGNOSIS — R404 Transient alteration of awareness: Secondary | ICD-10-CM | POA: Diagnosis not present

## 2023-08-26 DIAGNOSIS — K8020 Calculus of gallbladder without cholecystitis without obstruction: Secondary | ICD-10-CM | POA: Diagnosis not present

## 2023-08-27 DIAGNOSIS — Z96642 Presence of left artificial hip joint: Secondary | ICD-10-CM | POA: Diagnosis not present

## 2023-08-27 DIAGNOSIS — E785 Hyperlipidemia, unspecified: Secondary | ICD-10-CM | POA: Diagnosis not present

## 2023-08-27 DIAGNOSIS — I4891 Unspecified atrial fibrillation: Secondary | ICD-10-CM | POA: Diagnosis not present

## 2023-08-27 DIAGNOSIS — S066XAA Traumatic subarachnoid hemorrhage with loss of consciousness status unknown, initial encounter: Secondary | ICD-10-CM | POA: Diagnosis not present

## 2023-08-27 DIAGNOSIS — S2242XA Multiple fractures of ribs, left side, initial encounter for closed fracture: Secondary | ICD-10-CM | POA: Diagnosis not present

## 2023-08-27 DIAGNOSIS — I10 Essential (primary) hypertension: Secondary | ICD-10-CM | POA: Diagnosis not present

## 2023-08-27 DIAGNOSIS — W1830XD Fall on same level, unspecified, subsequent encounter: Secondary | ICD-10-CM | POA: Diagnosis not present

## 2023-08-27 DIAGNOSIS — Z471 Aftercare following joint replacement surgery: Secondary | ICD-10-CM | POA: Diagnosis not present

## 2023-08-27 DIAGNOSIS — R2689 Other abnormalities of gait and mobility: Secondary | ICD-10-CM | POA: Diagnosis not present

## 2023-08-27 DIAGNOSIS — M6281 Muscle weakness (generalized): Secondary | ICD-10-CM | POA: Diagnosis not present

## 2023-08-27 DIAGNOSIS — S72001A Fracture of unspecified part of neck of right femur, initial encounter for closed fracture: Secondary | ICD-10-CM | POA: Diagnosis not present

## 2023-08-27 DIAGNOSIS — S2242XD Multiple fractures of ribs, left side, subsequent encounter for fracture with routine healing: Secondary | ICD-10-CM | POA: Diagnosis not present

## 2023-08-27 DIAGNOSIS — D62 Acute posthemorrhagic anemia: Secondary | ICD-10-CM | POA: Diagnosis not present

## 2023-08-27 DIAGNOSIS — S72031A Displaced midcervical fracture of right femur, initial encounter for closed fracture: Secondary | ICD-10-CM | POA: Diagnosis not present

## 2023-08-27 DIAGNOSIS — I959 Hypotension, unspecified: Secondary | ICD-10-CM | POA: Diagnosis not present

## 2023-08-27 DIAGNOSIS — Z794 Long term (current) use of insulin: Secondary | ICD-10-CM | POA: Diagnosis not present

## 2023-08-27 DIAGNOSIS — I609 Nontraumatic subarachnoid hemorrhage, unspecified: Secondary | ICD-10-CM | POA: Diagnosis not present

## 2023-08-27 DIAGNOSIS — G40909 Epilepsy, unspecified, not intractable, without status epilepticus: Secondary | ICD-10-CM | POA: Diagnosis not present

## 2023-08-27 DIAGNOSIS — M25561 Pain in right knee: Secondary | ICD-10-CM | POA: Diagnosis not present

## 2023-08-27 DIAGNOSIS — R41841 Cognitive communication deficit: Secondary | ICD-10-CM | POA: Diagnosis not present

## 2023-08-27 DIAGNOSIS — Y92129 Unspecified place in nursing home as the place of occurrence of the external cause: Secondary | ICD-10-CM | POA: Diagnosis not present

## 2023-08-27 DIAGNOSIS — R1311 Dysphagia, oral phase: Secondary | ICD-10-CM | POA: Diagnosis not present

## 2023-08-27 DIAGNOSIS — E118 Type 2 diabetes mellitus with unspecified complications: Secondary | ICD-10-CM | POA: Diagnosis not present

## 2023-08-27 DIAGNOSIS — M545 Low back pain, unspecified: Secondary | ICD-10-CM | POA: Diagnosis not present

## 2023-08-27 DIAGNOSIS — Z96641 Presence of right artificial hip joint: Secondary | ICD-10-CM | POA: Diagnosis not present

## 2023-08-27 DIAGNOSIS — F03C0 Unspecified dementia, severe, without behavioral disturbance, psychotic disturbance, mood disturbance, and anxiety: Secondary | ICD-10-CM | POA: Diagnosis not present

## 2023-08-27 DIAGNOSIS — W1830XA Fall on same level, unspecified, initial encounter: Secondary | ICD-10-CM | POA: Diagnosis not present

## 2023-08-27 DIAGNOSIS — F4489 Other dissociative and conversion disorders: Secondary | ICD-10-CM | POA: Diagnosis not present

## 2023-08-27 DIAGNOSIS — R1310 Dysphagia, unspecified: Secondary | ICD-10-CM | POA: Diagnosis not present

## 2023-08-27 DIAGNOSIS — M542 Cervicalgia: Secondary | ICD-10-CM | POA: Diagnosis not present

## 2023-08-27 DIAGNOSIS — S7291XD Unspecified fracture of right femur, subsequent encounter for closed fracture with routine healing: Secondary | ICD-10-CM | POA: Diagnosis not present

## 2023-08-27 DIAGNOSIS — E119 Type 2 diabetes mellitus without complications: Secondary | ICD-10-CM | POA: Diagnosis not present

## 2023-08-27 DIAGNOSIS — S0990XA Unspecified injury of head, initial encounter: Secondary | ICD-10-CM | POA: Diagnosis not present

## 2023-08-27 DIAGNOSIS — S72091A Other fracture of head and neck of right femur, initial encounter for closed fracture: Secondary | ICD-10-CM | POA: Diagnosis not present

## 2023-08-27 DIAGNOSIS — Z7901 Long term (current) use of anticoagulants: Secondary | ICD-10-CM | POA: Diagnosis not present

## 2023-08-27 DIAGNOSIS — Z743 Need for continuous supervision: Secondary | ICD-10-CM | POA: Diagnosis not present

## 2023-08-27 DIAGNOSIS — S066X0A Traumatic subarachnoid hemorrhage without loss of consciousness, initial encounter: Secondary | ICD-10-CM | POA: Diagnosis not present

## 2023-09-01 DIAGNOSIS — R339 Retention of urine, unspecified: Secondary | ICD-10-CM | POA: Diagnosis not present

## 2023-09-01 DIAGNOSIS — I959 Hypotension, unspecified: Secondary | ICD-10-CM | POA: Diagnosis not present

## 2023-09-01 DIAGNOSIS — I10 Essential (primary) hypertension: Secondary | ICD-10-CM | POA: Diagnosis not present

## 2023-09-01 DIAGNOSIS — Z743 Need for continuous supervision: Secondary | ICD-10-CM | POA: Diagnosis not present

## 2023-09-01 DIAGNOSIS — R2689 Other abnormalities of gait and mobility: Secondary | ICD-10-CM | POA: Diagnosis not present

## 2023-09-01 DIAGNOSIS — M6281 Muscle weakness (generalized): Secondary | ICD-10-CM | POA: Diagnosis not present

## 2023-09-01 DIAGNOSIS — Z96649 Presence of unspecified artificial hip joint: Secondary | ICD-10-CM | POA: Diagnosis not present

## 2023-09-01 DIAGNOSIS — I4891 Unspecified atrial fibrillation: Secondary | ICD-10-CM | POA: Diagnosis not present

## 2023-09-01 DIAGNOSIS — G40909 Epilepsy, unspecified, not intractable, without status epilepticus: Secondary | ICD-10-CM | POA: Diagnosis not present

## 2023-09-01 DIAGNOSIS — I609 Nontraumatic subarachnoid hemorrhage, unspecified: Secondary | ICD-10-CM | POA: Diagnosis not present

## 2023-09-01 DIAGNOSIS — F03C0 Unspecified dementia, severe, without behavioral disturbance, psychotic disturbance, mood disturbance, and anxiety: Secondary | ICD-10-CM | POA: Diagnosis not present

## 2023-09-01 DIAGNOSIS — S2242XD Multiple fractures of ribs, left side, subsequent encounter for fracture with routine healing: Secondary | ICD-10-CM | POA: Diagnosis not present

## 2023-09-01 DIAGNOSIS — S2242XA Multiple fractures of ribs, left side, initial encounter for closed fracture: Secondary | ICD-10-CM | POA: Diagnosis not present

## 2023-09-01 DIAGNOSIS — E785 Hyperlipidemia, unspecified: Secondary | ICD-10-CM | POA: Diagnosis not present

## 2023-09-01 DIAGNOSIS — Z96642 Presence of left artificial hip joint: Secondary | ICD-10-CM | POA: Diagnosis not present

## 2023-09-01 DIAGNOSIS — W1830XA Fall on same level, unspecified, initial encounter: Secondary | ICD-10-CM | POA: Diagnosis not present

## 2023-09-01 DIAGNOSIS — F4489 Other dissociative and conversion disorders: Secondary | ICD-10-CM | POA: Diagnosis not present

## 2023-09-01 DIAGNOSIS — R1311 Dysphagia, oral phase: Secondary | ICD-10-CM | POA: Diagnosis not present

## 2023-09-01 DIAGNOSIS — R5381 Other malaise: Secondary | ICD-10-CM | POA: Diagnosis not present

## 2023-09-01 DIAGNOSIS — S066X0A Traumatic subarachnoid hemorrhage without loss of consciousness, initial encounter: Secondary | ICD-10-CM | POA: Diagnosis not present

## 2023-09-01 DIAGNOSIS — W1830XD Fall on same level, unspecified, subsequent encounter: Secondary | ICD-10-CM | POA: Diagnosis not present

## 2023-09-01 DIAGNOSIS — Z96641 Presence of right artificial hip joint: Secondary | ICD-10-CM | POA: Diagnosis not present

## 2023-09-01 DIAGNOSIS — E118 Type 2 diabetes mellitus with unspecified complications: Secondary | ICD-10-CM | POA: Diagnosis not present

## 2023-09-01 DIAGNOSIS — S7291XD Unspecified fracture of right femur, subsequent encounter for closed fracture with routine healing: Secondary | ICD-10-CM | POA: Diagnosis not present

## 2023-09-01 DIAGNOSIS — R41841 Cognitive communication deficit: Secondary | ICD-10-CM | POA: Diagnosis not present

## 2023-09-01 DIAGNOSIS — S72001A Fracture of unspecified part of neck of right femur, initial encounter for closed fracture: Secondary | ICD-10-CM | POA: Diagnosis not present

## 2023-09-04 DIAGNOSIS — R5381 Other malaise: Secondary | ICD-10-CM | POA: Diagnosis not present

## 2023-09-04 DIAGNOSIS — S72001A Fracture of unspecified part of neck of right femur, initial encounter for closed fracture: Secondary | ICD-10-CM | POA: Diagnosis not present

## 2023-09-05 ENCOUNTER — Telehealth: Payer: Self-pay | Admitting: Family Medicine

## 2023-09-05 DIAGNOSIS — Z96642 Presence of left artificial hip joint: Secondary | ICD-10-CM | POA: Diagnosis not present

## 2023-09-05 DIAGNOSIS — W1830XD Fall on same level, unspecified, subsequent encounter: Secondary | ICD-10-CM | POA: Diagnosis not present

## 2023-09-05 DIAGNOSIS — E118 Type 2 diabetes mellitus with unspecified complications: Secondary | ICD-10-CM | POA: Diagnosis not present

## 2023-09-05 DIAGNOSIS — S7291XD Unspecified fracture of right femur, subsequent encounter for closed fracture with routine healing: Secondary | ICD-10-CM | POA: Diagnosis not present

## 2023-09-05 DIAGNOSIS — E785 Hyperlipidemia, unspecified: Secondary | ICD-10-CM | POA: Diagnosis not present

## 2023-09-05 DIAGNOSIS — R5381 Other malaise: Secondary | ICD-10-CM | POA: Diagnosis not present

## 2023-09-05 DIAGNOSIS — I4891 Unspecified atrial fibrillation: Secondary | ICD-10-CM | POA: Diagnosis not present

## 2023-09-05 DIAGNOSIS — S2242XD Multiple fractures of ribs, left side, subsequent encounter for fracture with routine healing: Secondary | ICD-10-CM | POA: Diagnosis not present

## 2023-09-05 DIAGNOSIS — S72001A Fracture of unspecified part of neck of right femur, initial encounter for closed fracture: Secondary | ICD-10-CM | POA: Diagnosis not present

## 2023-09-05 DIAGNOSIS — F03C0 Unspecified dementia, severe, without behavioral disturbance, psychotic disturbance, mood disturbance, and anxiety: Secondary | ICD-10-CM | POA: Diagnosis not present

## 2023-09-05 DIAGNOSIS — I609 Nontraumatic subarachnoid hemorrhage, unspecified: Secondary | ICD-10-CM | POA: Diagnosis not present

## 2023-09-05 DIAGNOSIS — I10 Essential (primary) hypertension: Secondary | ICD-10-CM | POA: Diagnosis not present

## 2023-09-05 NOTE — Telephone Encounter (Signed)
Copied from CRM [id number]. Topic: Medical Record Request - Provider/Facility Request >> Sep 05, 2023  3:21 PM Clayton Bibles wrote: Reason for CRM: [facility] needs copy of vaccine record Please fax to [phone]; Please call Shanna at [phone]

## 2023-09-05 NOTE — Telephone Encounter (Signed)
Immunization record faxed, spoke to [place] let her know it is on its way

## 2023-09-05 NOTE — Telephone Encounter (Signed)
Immunization record sent.

## 2023-09-06 DIAGNOSIS — E118 Type 2 diabetes mellitus with unspecified complications: Secondary | ICD-10-CM | POA: Diagnosis not present

## 2023-09-06 DIAGNOSIS — R339 Retention of urine, unspecified: Secondary | ICD-10-CM | POA: Diagnosis not present

## 2023-09-07 DIAGNOSIS — E118 Type 2 diabetes mellitus with unspecified complications: Secondary | ICD-10-CM | POA: Diagnosis not present

## 2023-09-07 DIAGNOSIS — R339 Retention of urine, unspecified: Secondary | ICD-10-CM | POA: Diagnosis not present

## 2023-09-20 DIAGNOSIS — Z96649 Presence of unspecified artificial hip joint: Secondary | ICD-10-CM | POA: Diagnosis not present

## 2023-09-20 DIAGNOSIS — Z96641 Presence of right artificial hip joint: Secondary | ICD-10-CM | POA: Diagnosis not present

## 2023-10-09 DIAGNOSIS — I10 Essential (primary) hypertension: Secondary | ICD-10-CM | POA: Diagnosis not present

## 2023-10-10 DIAGNOSIS — S7291XD Unspecified fracture of right femur, subsequent encounter for closed fracture with routine healing: Secondary | ICD-10-CM | POA: Diagnosis not present

## 2023-10-10 DIAGNOSIS — R7989 Other specified abnormal findings of blood chemistry: Secondary | ICD-10-CM | POA: Diagnosis not present

## 2023-10-10 DIAGNOSIS — M6281 Muscle weakness (generalized): Secondary | ICD-10-CM | POA: Diagnosis not present

## 2023-10-10 DIAGNOSIS — E118 Type 2 diabetes mellitus with unspecified complications: Secondary | ICD-10-CM | POA: Diagnosis not present

## 2023-10-10 DIAGNOSIS — R5381 Other malaise: Secondary | ICD-10-CM | POA: Diagnosis not present

## 2023-10-24 ENCOUNTER — Other Ambulatory Visit: Payer: Self-pay | Admitting: Family Medicine

## 2023-10-24 DIAGNOSIS — I10 Essential (primary) hypertension: Secondary | ICD-10-CM

## 2023-11-04 DEATH — deceased
